# Patient Record
Sex: Female | Born: 1945
Health system: Southern US, Community
[De-identification: ages and names within clinical notes are randomized; demographics above are authoritative.]

## PROBLEM LIST (undated history)

## (undated) DIAGNOSIS — K769 Liver disease, unspecified: Secondary | ICD-10-CM

## (undated) DIAGNOSIS — N289 Disorder of kidney and ureter, unspecified: Secondary | ICD-10-CM

## (undated) DIAGNOSIS — E079 Disorder of thyroid, unspecified: Secondary | ICD-10-CM

## (undated) DIAGNOSIS — D649 Anemia, unspecified: Secondary | ICD-10-CM

## (undated) DIAGNOSIS — I10 Essential (primary) hypertension: Secondary | ICD-10-CM

## (undated) HISTORY — PX: COCHLEAR IMPLANT: SUR684

## (undated) HISTORY — PX: KNEE SURGERY: SHX244

---

## 2000-03-28 ENCOUNTER — Encounter: Payer: Self-pay | Admitting: General Surgery

## 2000-03-28 ENCOUNTER — Ambulatory Visit (HOSPITAL_COMMUNITY): Admission: RE | Admit: 2000-03-28 | Discharge: 2000-03-28 | Payer: Self-pay | Admitting: General Surgery

## 2000-09-18 ENCOUNTER — Ambulatory Visit (HOSPITAL_COMMUNITY): Admission: RE | Admit: 2000-09-18 | Discharge: 2000-09-18 | Payer: Self-pay | Admitting: Gastroenterology

## 2000-09-18 ENCOUNTER — Encounter (INDEPENDENT_AMBULATORY_CARE_PROVIDER_SITE_OTHER): Payer: Self-pay | Admitting: *Deleted

## 2000-09-18 ENCOUNTER — Encounter: Payer: Self-pay | Admitting: Gastroenterology

## 2001-01-20 ENCOUNTER — Other Ambulatory Visit: Admission: RE | Admit: 2001-01-20 | Discharge: 2001-01-20 | Payer: Self-pay | Admitting: Internal Medicine

## 2001-01-27 ENCOUNTER — Ambulatory Visit (HOSPITAL_COMMUNITY): Admission: RE | Admit: 2001-01-27 | Discharge: 2001-01-27 | Payer: Self-pay | Admitting: Internal Medicine

## 2001-01-27 ENCOUNTER — Encounter: Payer: Self-pay | Admitting: Internal Medicine

## 2001-02-17 ENCOUNTER — Observation Stay (HOSPITAL_COMMUNITY): Admission: EM | Admit: 2001-02-17 | Discharge: 2001-02-19 | Payer: Self-pay | Admitting: *Deleted

## 2001-02-17 ENCOUNTER — Encounter: Payer: Self-pay | Admitting: *Deleted

## 2001-02-18 ENCOUNTER — Encounter: Payer: Self-pay | Admitting: *Deleted

## 2001-02-26 ENCOUNTER — Ambulatory Visit (HOSPITAL_COMMUNITY): Admission: RE | Admit: 2001-02-26 | Discharge: 2001-02-26 | Payer: Self-pay | Admitting: *Deleted

## 2001-02-26 ENCOUNTER — Encounter: Payer: Self-pay | Admitting: *Deleted

## 2001-05-27 ENCOUNTER — Ambulatory Visit (HOSPITAL_COMMUNITY): Admission: RE | Admit: 2001-05-27 | Discharge: 2001-05-27 | Payer: Self-pay | Admitting: Gastroenterology

## 2002-01-20 ENCOUNTER — Encounter: Payer: Self-pay | Admitting: Gastroenterology

## 2002-01-20 ENCOUNTER — Ambulatory Visit (HOSPITAL_COMMUNITY): Admission: RE | Admit: 2002-01-20 | Discharge: 2002-01-20 | Payer: Self-pay | Admitting: Gastroenterology

## 2008-05-28 ENCOUNTER — Emergency Department (HOSPITAL_COMMUNITY): Admission: EM | Admit: 2008-05-28 | Discharge: 2008-05-28 | Payer: Self-pay | Admitting: Emergency Medicine

## 2009-01-06 ENCOUNTER — Inpatient Hospital Stay (HOSPITAL_COMMUNITY): Admission: RE | Admit: 2009-01-06 | Discharge: 2009-01-09 | Payer: Self-pay | Admitting: Orthopaedic Surgery

## 2011-04-09 LAB — CBC
HCT: 32.5 % — ABNORMAL LOW (ref 36.0–46.0)
Hemoglobin: 11.5 g/dL — ABNORMAL LOW (ref 12.0–15.0)
Hemoglobin: 13.3 g/dL (ref 12.0–15.0)
Hemoglobin: 10.1 g/dL — ABNORMAL LOW (ref 12.0–15.0)
MCHC: 33 g/dL (ref 30.0–36.0)
MCHC: 33.4 g/dL (ref 30.0–36.0)
MCHC: 33 g/dL (ref 30.0–36.0)
MCHC: 33.5 g/dL (ref 30.0–36.0)
MCV: 94.3 fL (ref 78.0–100.0)
MCV: 95.5 fL (ref 78.0–100.0)
Platelets: 105 10*3/uL — ABNORMAL LOW (ref 150–400)
Platelets: 108 10*3/uL — ABNORMAL LOW (ref 150–400)
RBC: 3.64 MIL/uL — ABNORMAL LOW (ref 3.87–5.11)
RBC: 4.25 MIL/uL (ref 3.87–5.11)
RDW: 13.5 % (ref 11.5–15.5)
RDW: 13.9 % (ref 11.5–15.5)
RDW: 14 % (ref 11.5–15.5)

## 2011-04-09 LAB — BASIC METABOLIC PANEL
BUN: 40 mg/dL — ABNORMAL HIGH (ref 6–23)
BUN: 28 mg/dL — ABNORMAL HIGH (ref 6–23)
BUN: 32 mg/dL — ABNORMAL HIGH (ref 6–23)
CO2: 19 mEq/L (ref 19–32)
CO2: 21 mEq/L (ref 19–32)
CO2: 24 mEq/L (ref 19–32)
Calcium: 10.3 mg/dL (ref 8.4–10.5)
Calcium: 9.3 mg/dL (ref 8.4–10.5)
Chloride: 104 mEq/L (ref 96–112)
Chloride: 109 mEq/L (ref 96–112)
Chloride: 104 mEq/L (ref 96–112)
Creatinine, Ser: 2 mg/dL — ABNORMAL HIGH (ref 0.4–1.2)
Creatinine, Ser: 3.02 mg/dL — ABNORMAL HIGH (ref 0.4–1.2)
Creatinine, Ser: 2.08 mg/dL — ABNORMAL HIGH (ref 0.4–1.2)
Creatinine, Ser: 2.26 mg/dL — ABNORMAL HIGH (ref 0.4–1.2)
GFR calc Af Amer: 19 mL/min — ABNORMAL LOW (ref >60)
GFR calc Af Amer: 30 mL/min — ABNORMAL LOW (ref >60)
GFR calc non Af Amer: 16 mL/min — ABNORMAL LOW (ref >60)
Glucose, Bld: 103 mg/dL — ABNORMAL HIGH (ref 70–99)
Glucose, Bld: 66 mg/dL — ABNORMAL LOW (ref 70–99)
Glucose, Bld: 110 mg/dL — ABNORMAL HIGH (ref 70–99)
Glucose, Bld: 92 mg/dL (ref 70–99)
Potassium: 4.6 mEq/L (ref 3.5–5.1)
Potassium: 4.4 mEq/L (ref 3.5–5.1)
Sodium: 139 mEq/L (ref 135–145)
Sodium: 142 mEq/L (ref 135–145)

## 2011-04-09 LAB — HEPATIC FUNCTION PANEL
AST: 37 U/L (ref 0–37)
Albumin: 3.8 g/dL (ref 3.5–5.2)
Alkaline Phosphatase: 139 U/L — ABNORMAL HIGH (ref 39–117)
Total Protein: 7 g/dL (ref 6.0–8.3)

## 2011-04-09 LAB — PROTIME-INR: INR: 2.9 — ABNORMAL HIGH (ref 0.00–1.49)

## 2011-05-08 NOTE — Discharge Summary (Signed)
[name], [name]                ACCOUNT NO.:  [id number]   MEDICAL RECORD NO.:  [id number]          PATIENT TYPE:  INP   LOCATION:  5039                         FACILITY:  [facility]   PHYSICIAN:  Monico Blitz. Dalldorf, M.D.DATE OF BIRTH:  [birth year]   DATE OF ADMISSION:  01/06/2009  DATE OF DISCHARGE:  01/09/2009                               DISCHARGE SUMMARY   FINAL DIAGNOSIS:  1. End-stage degenerative joint disease, right knee.  2. Postoperative blood loss anemia.  3. Hypertension.  4. Renal insufficiency.  5. Hypothyroidism.  6. History of liver problem.   PROCEDURES:  On January 06, 2009, right total knee arthroplasty.   SURGEON:  Monico Blitz. Rhona Raider, M.D.   HISTORY:  This is a 65 year old African American female followed by Dr.  Rhona Raider for chronic knee pain.  She has subsequently failed  conservative management and was ready for a total knee arthroplasty as  he was scheduled for this.  The patient was admitted to [facility] on January 06, 2009.  At that time she underwent total knee  arthroplasty of the right knee.  The patient tolerated the procedure  well.  No intraoperative complications occurred.  Postoperatively, the  patient did well.  She had postop blood loss anemia, but did not require  any blood products.  She continued to progress satisfactorily and  managed to remain afebrile.  She worked up with physical therapy and  started walking without assistance prior to the day of discharge.   LABORATORY DATA:  Her lab work remains satisfactory.  Her final CBC  showed her WBC was 8.8, hemoglobin 10.1, hematocrit 3.0, and platelets  were 105,000.  Sodium of 139, potassium of 4.1, chloride 107, CO2 24,  BUN 28, creatinine 2.03, glucose 72, and calcium 9.3.   Of note throughout her stay, she has had elevated BUN and creatinine.  This was also noted prior to her admissions.  She did give history of  some type of liver scarring and duct clogging she has had, but  her  indirect and direct bilirubin and liver enzymes remained relatively  normal.  She only had increased creatinine and BUN readings, but she was  doing well and was ready for discharge on the third postoperative day.  This time she preferred discharge.   At the time of discharge her medications were; she was taking Darvocet-N  100 1-2 p.o. q.4-6 p.r.n. pain.  She is taking  triamterene/hydrochlorothiazide 37.5-2.5 once a day.  She has been  taking Vicodin.  She was given medication to go home, which were  Percocet 5/325, 1-2 p.o. q.4-6 h. p.r.n. pain, 50 with no refills.  She  was given Robaxin 750 mg 1 p.o. q.i.d. p.r.n. for muscle spasm and she  was given Coumadin, which she will continue on  pharmacy protocol for Coumadin.  She will continue at home with physical  therapy as well.  No untoward events occurred during the stay.  She was  discharged to home on January 09, 2009, in satisfactory and stable  condition.  She should follow up with Dr. Rhona Raider in 11 days.  Billey Chang, P.A.      Monico Blitz Rhona Raider, M.D.  Electronically Signed    CL/MEDQ  D:  01/09/2009  T:  01/09/2009  Job:  [id number]

## 2011-05-08 NOTE — Op Note (Signed)
[name], [name]                ACCOUNT NO.:  [id number]   MEDICAL RECORD NO.:  [id number]          PATIENT TYPE:  INP   LOCATION:  5039                         FACILITY:  [facility]   PHYSICIAN:  Monico Blitz. Dalldorf, M.D.DATE OF BIRTH:  Nov 07, 1946   DATE OF PROCEDURE:  01/06/2009  DATE OF DISCHARGE:                               OPERATIVE REPORT   PREOPERATIVE DIAGNOSIS:  Right knee degenerative joint disease.   POSTOPERATIVE DIAGNOSIS:  Right knee degenerative joint disease.   PROCEDURE:  Right total knee replacement.   ANESTHESIA:  General and block.   ATTENDING SURGEON:  Monico Blitz. Rhona Raider, MD   ASSISTANT:  Roselee Nova, PA   INDICATIONS FOR PROCEDURE:  The patient is a 65 year old woman with a  long history of a painful right knee.  She has a significant valgus  deformity and has advanced degenerative change by x-ray.  She has failed  oral anti-inflammatories and multiple injections.  She persists with  pain with difficulty walking and pain at rest, and she is offered a knee  replacement.  Informed operative consent was obtained after discussion  of possible complications including reaction to anesthesia, infection,  DVT, PE, and death.   SUMMARY, FINDINGS, AND PROCEDURE:  Under general anesthesia and a block  through a standard longitudinal anterior incision, a right knee  replacement was performed.  She had advanced degenerative change,  especially lateral and had a moderate valgus deformity.  We addressed  her problem with a cemented DePuy LCS system using standard right femur,  2.5 tray, standard 10-mm deep dish insert, and 38-mm all-polyethylene  patella.  We did correct her valgus deformity with some soft tissue  releases and appropriate balancing.  Chauncey Cruel assisted throughout  and was invaluable to the completion of the case in that he helped  position and retract while I performed the procedure.  He also closed  simultaneously to help minimize the OR  time.   DESCRIPTION OF PROCEDURE:  The patient was taken to the operating suite  where general anesthetic was applied without difficulty.  She was also  given a block in the preanesthesia area.  She was positioned supine and  prepped and draped in normal sterile fashion.  After administration of  IV Kefzol, the right leg was elevated, exsanguinated, tourniquet  inflated about the thigh.  A longitudinal anterior incision was made  with dissection down the extensor mechanism.  All appropriate anti-  infected measures were used including closed hooded exhaust systems for  each member of the surgical team, preoperative IV antibiotic, and  Betadine-impregnated drape.  A medial parapatellar incision was made.  The kneecap was flipped and the knee flexed.  Some residual meniscal  tissues were removed along with the ACL and PCL.  Her findings were as  noted above and her bone quality was excellent.  We placed an  extramedullary guide on the tibia to make a cut with a slight posterior  tilt.  I performed a slight soft tissue release off of the distal femur  and then I placed an intramedullary guide to make anterior-posterior  cuts creating  a flexion gap of 10 mm.  A second intramedullary guide was  placed in the femur to make a distal cut creating an equal extension gap  of 10 mm balancing the knee.  This seemed to eliminate her valgus  deformity and appropriate degree.  The femur sized to a standard and the  tibia to 2.5 and appropriate guides were placed and utilized.  We cut  down to take her patella thickness by 8 mm to a 15 and appropriate guide  was placed and utilized for a 38.  A trial reduction was done with all  these components, and she easily came to slight hyperextension and  flexed well.  The patella tracked well and no lateral release was  required.  Trial components were removed followed by pulsatile lavage  irrigation of all 3 cut bony surfaces.  Cement was mixed including   Zinacef antibiotic and was pressurized onto the bones followed by  placement of the aforementioned DePuy LCS components.  Excess cement was  trimmed and pressure was held on the components until the cement had  hardened.  The tourniquet was deflated, and a small amount of bleeding  was easily controlled with Bovie cautery.  The knee was thoroughly  irrigated followed by reapproximation of the extensor mechanism with #1  Vicryl in interrupted fashion.  Subcutaneous tissues were reapproximated  with 0 and 2-0 undyed Vicryl and skin was closed with staples.  Adaptic  was applied followed by dry gauze and loose Ace wrap.  Estimated blood  loss and intraoperative fluids were obtained from the anesthesia records  as can accurate tourniquet time.   DISPOSITION:  The patient was extubated in the operating room and taken  to recovery room in stable addition.  She is to be admitted under  Orthopedic Surgery Service for appropriate postop care to include  perioperative antibiotics and Coumadin plus Lovenox for DVT prophylaxis.      Monico Blitz Rhona Raider, M.D.  Electronically Signed     PGD/MEDQ  D:  01/06/2009  T:  01/07/2009  Job:  [id number]

## 2011-05-11 NOTE — Procedures (Signed)
[facility]  Patient:    [name], [name]                         MRN: [id number] Proc. Date: 02/17/01 Attending:  Jim Desanctis, M.D. CC:         Nelwyn Salisbury, M.D.   Procedure Report  PROCEDURE PERFORMED:  Endoscopic retrograde cholangiopancreatography.  INDICATIONS:  Rule out obstructive liver disease. See previous clinical note and extensive workup by Dr. Nelwyn Salisbury.  ANESTHESIA:  Demerol 100 mg, Versed 11 mg was given intravenously, Glucagon 1 mg and Levsin 0.25 mg was given throughout the procedure.  MEDICATIONS:  Unasyn 3 g preoperatively.  DESCRIPTION OF PROCEDURE:  With the patient mildly sedated in the prone position, room #7 of radiology, the Olympus videoscopic side-viewing duodenoscope was inserted in the mouth and passed into the stomach and second portion of the duodenum where the ampulla of Vater was seen. The os was cannulated subsequently with a guidewire and we followed it with the catheter injecting as we went. The common bile duct appeared normal and there was filling of the gallbladder, but, there appeared to be complete obstruction of the biliary tree in the right and left common hepatic ducts and we were able to get a guidewire through one obstruction on the left and injected contrast material above the obstruction, but it did not delineate this area well and diffused out rather rapidly, indicating there may have been a leak in the system. We pulled the catheter and the guidewire back and terminated the procedure at that time. The patients vital signs and pulse oximeter remained stable. The patient tolerated the procedure well without apparent complications.  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  PLAN:  Will discuss with patient but may need a surgical intervention to bypass this obstruction. DD:   02/17/01 TD:  02/18/01 Job: [id number]

## 2011-05-11 NOTE — Procedures (Signed)
[place]  Patient:    [name], [name]                         MRN: [id number] Proc. Date: 05/27/01 Adm. Date:  [id number] Attending:  Juanita Craver CC:         Royetta Crochet. Karlton Lemon, M.D.   Procedure Report  DATE OF BIRTH:  1946/09/11  REFERRING PHYSICIAN:  Royetta Crochet. Karlton Lemon, M.D.  PROCEDURE PERFORMED:  Colonoscopy.  ENDOSCOPIST:  Nelwyn Salisbury, M.D.  INSTRUMENT USED:  Olympus video colonoscope.  INDICATIONS FOR PROCEDURE:  The patient is a 65 year old African-American female with a history of guaiac positive stools and liver disease, rule out colonic polyps, masses, hemorrhoids, etc.  PREPROCEDURE PREPARATION:  Informed consent was procured from the patient. The patient was fasted for eight hours prior to the procedure and prepped with a bottle of magnesium citrate and a gallon of NuLytely the night prior to the procedure.  PREPROCEDURE PHYSICAL:  The patient had stable vital signs.  Neck supple. Chest clear to auscultation.  S1, S2 regular.  Abdomen soft with normal abdominal bowel sounds.  DESCRIPTION OF PROCEDURE:  The patient was placed in the left lateral decubitus position and sedated with 50 mg of Demerol and 5 mg of Versed intravenously.  Once the patient was adequately sedated and maintained on low-flow oxygen and continuous cardiac monitoring, the Olympus video colonoscope was advanced from the rectum to the cecum.  There was some residual stool in the colon.  Multiple washes were done.  The cecal base was identified.  A very small lesion could have been missed.  The patient tolerated the procedure well without complication.  No masses or polyps were seen.  Small internal hemorrhoids were appreciated on retroflexion in the rectum.  IMPRESSION: 1. Large amount of residual stool in the colon.  A small lesion could have    been missed. 2. No large masses or polyps seen. 3. Small internal hemorrhoids seen on retroflexion  in the rectum.  RECOMMENDATIONS:  Outpatient follow-up is advised for repeat guaiac testing. Further recommendation will be made at that time. DD:  05/27/01 TD:  05/27/01 Job: [id number]

## 2012-07-03 ENCOUNTER — Other Ambulatory Visit: Payer: Self-pay | Admitting: Internal Medicine

## 2012-07-03 ENCOUNTER — Ambulatory Visit
Admission: RE | Admit: 2012-07-03 | Discharge: 2012-07-03 | Disposition: A | Discharge status: Home / Self Care | Payer: Medicare Other | Source: Ambulatory Visit | Attending: Internal Medicine | Admitting: Internal Medicine

## 2012-07-03 DIAGNOSIS — R634 Abnormal weight loss: Secondary | ICD-10-CM

## 2012-07-03 DIAGNOSIS — F17200 Nicotine dependence, unspecified, uncomplicated: Secondary | ICD-10-CM

## 2013-03-30 ENCOUNTER — Encounter (HOSPITAL_COMMUNITY): Payer: Self-pay | Admitting: Emergency Medicine

## 2013-03-30 ENCOUNTER — Emergency Department (HOSPITAL_COMMUNITY): Payer: Medicare Other

## 2013-03-30 ENCOUNTER — Inpatient Hospital Stay (HOSPITAL_COMMUNITY)
Admission: EM | Admit: 2013-03-30 | Discharge: 2013-04-03 | DRG: 193 | Disposition: A | Discharge status: Home / Self Care | Payer: Medicare Other | Attending: Internal Medicine | Admitting: Internal Medicine

## 2013-03-30 ENCOUNTER — Inpatient Hospital Stay (HOSPITAL_COMMUNITY): Payer: Medicare Other

## 2013-03-30 DIAGNOSIS — E878 Other disorders of electrolyte and fluid balance, not elsewhere classified: Secondary | ICD-10-CM

## 2013-03-30 DIAGNOSIS — F17200 Nicotine dependence, unspecified, uncomplicated: Secondary | ICD-10-CM | POA: Diagnosis present

## 2013-03-30 DIAGNOSIS — J181 Lobar pneumonia, unspecified organism: Secondary | ICD-10-CM | POA: Diagnosis present

## 2013-03-30 DIAGNOSIS — R911 Solitary pulmonary nodule: Secondary | ICD-10-CM | POA: Diagnosis present

## 2013-03-30 DIAGNOSIS — D649 Anemia, unspecified: Secondary | ICD-10-CM

## 2013-03-30 DIAGNOSIS — N179 Acute kidney failure, unspecified: Secondary | ICD-10-CM | POA: Diagnosis present

## 2013-03-30 DIAGNOSIS — E871 Hypo-osmolality and hyponatremia: Secondary | ICD-10-CM

## 2013-03-30 DIAGNOSIS — J9601 Acute respiratory failure with hypoxia: Secondary | ICD-10-CM

## 2013-03-30 DIAGNOSIS — E872 Acidosis, unspecified: Secondary | ICD-10-CM | POA: Diagnosis present

## 2013-03-30 DIAGNOSIS — Z79899 Other long term (current) drug therapy: Secondary | ICD-10-CM

## 2013-03-30 DIAGNOSIS — D72829 Elevated white blood cell count, unspecified: Secondary | ICD-10-CM | POA: Diagnosis present

## 2013-03-30 DIAGNOSIS — R630 Anorexia: Secondary | ICD-10-CM | POA: Diagnosis present

## 2013-03-30 DIAGNOSIS — Z720 Tobacco use: Secondary | ICD-10-CM | POA: Diagnosis present

## 2013-03-30 DIAGNOSIS — R1310 Dysphagia, unspecified: Secondary | ICD-10-CM | POA: Diagnosis present

## 2013-03-30 DIAGNOSIS — N2889 Other specified disorders of kidney and ureter: Secondary | ICD-10-CM | POA: Diagnosis present

## 2013-03-30 DIAGNOSIS — J9600 Acute respiratory failure, unspecified whether with hypoxia or hypercapnia: Secondary | ICD-10-CM | POA: Diagnosis not present

## 2013-03-30 DIAGNOSIS — J189 Pneumonia, unspecified organism: Principal | ICD-10-CM | POA: Diagnosis present

## 2013-03-30 DIAGNOSIS — Z713 Dietary counseling and surveillance: Secondary | ICD-10-CM

## 2013-03-30 DIAGNOSIS — I129 Hypertensive chronic kidney disease with stage 1 through stage 4 chronic kidney disease, or unspecified chronic kidney disease: Secondary | ICD-10-CM | POA: Diagnosis present

## 2013-03-30 DIAGNOSIS — E860 Dehydration: Secondary | ICD-10-CM | POA: Diagnosis present

## 2013-03-30 DIAGNOSIS — R042 Hemoptysis: Secondary | ICD-10-CM | POA: Diagnosis present

## 2013-03-30 DIAGNOSIS — J9621 Acute and chronic respiratory failure with hypoxia: Secondary | ICD-10-CM | POA: Diagnosis present

## 2013-03-30 DIAGNOSIS — E039 Hypothyroidism, unspecified: Secondary | ICD-10-CM | POA: Diagnosis present

## 2013-03-30 DIAGNOSIS — K769 Liver disease, unspecified: Secondary | ICD-10-CM | POA: Diagnosis present

## 2013-03-30 DIAGNOSIS — T502X5A Adverse effect of carbonic-anhydrase inhibitors, benzothiadiazides and other diuretics, initial encounter: Secondary | ICD-10-CM

## 2013-03-30 DIAGNOSIS — N184 Chronic kidney disease, stage 4 (severe): Secondary | ICD-10-CM | POA: Diagnosis present

## 2013-03-30 DIAGNOSIS — R634 Abnormal weight loss: Secondary | ICD-10-CM | POA: Diagnosis present

## 2013-03-30 DIAGNOSIS — E079 Disorder of thyroid, unspecified: Secondary | ICD-10-CM

## 2013-03-30 DIAGNOSIS — N289 Disorder of kidney and ureter, unspecified: Secondary | ICD-10-CM | POA: Diagnosis present

## 2013-03-30 HISTORY — DX: Disorder of kidney and ureter, unspecified: N28.9

## 2013-03-30 HISTORY — DX: Liver disease, unspecified: K76.9

## 2013-03-30 HISTORY — DX: Disorder of thyroid, unspecified: E07.9

## 2013-03-30 LAB — LACTIC ACID, PLASMA: Lactic Acid, Venous: 4.1 mmol/L — ABNORMAL HIGH (ref 0.5–2.2)

## 2013-03-30 LAB — TSH: TSH: 0.297 u[IU]/mL — ABNORMAL LOW (ref 0.350–4.500)

## 2013-03-30 LAB — BASIC METABOLIC PANEL
BUN: 48 mg/dL — ABNORMAL HIGH (ref 6–23)
CO2: 16 mEq/L — ABNORMAL LOW (ref 19–32)
Calcium: 9.2 mg/dL (ref 8.4–10.5)
Chloride: 80 mEq/L — ABNORMAL LOW (ref 96–112)
Chloride: 89 mEq/L — ABNORMAL LOW (ref 96–112)
Creatinine, Ser: 1.87 mg/dL — ABNORMAL HIGH (ref 0.50–1.10)
Creatinine, Ser: 1.74 mg/dL — ABNORMAL HIGH (ref 0.50–1.10)
GFR calc Af Amer: 29 mL/min — ABNORMAL LOW (ref >90)
GFR calc Af Amer: 31 mL/min — ABNORMAL LOW (ref >90)
GFR calc non Af Amer: 25 mL/min — ABNORMAL LOW (ref >90)
GFR calc non Af Amer: 26 mL/min — ABNORMAL LOW (ref >90)
Potassium: 4.6 mEq/L (ref 3.5–5.1)
Potassium: 5.2 mEq/L — ABNORMAL HIGH (ref 3.5–5.1)

## 2013-03-30 LAB — URINALYSIS, ROUTINE W REFLEX MICROSCOPIC
Nitrite: NEGATIVE
Specific Gravity, Urine: 1.013 (ref 1.005–1.030)
Urobilinogen, UA: 0.2 mg/dL (ref 0.0–1.0)

## 2013-03-30 LAB — CBC WITH DIFFERENTIAL/PLATELET
Basophils Absolute: 0 10*3/uL (ref 0.0–0.1)
Basophils Absolute: 0 10*3/uL (ref 0.0–0.1)
Basophils Relative: 0 % (ref 0–1)
Eosinophils Absolute: 0 10*3/uL (ref 0.0–0.7)
Eosinophils Absolute: 0 10*3/uL (ref 0.0–0.7)
HCT: 23.9 % — ABNORMAL LOW (ref 36.0–46.0)
HCT: 28 % — ABNORMAL LOW (ref 36.0–46.0)
Hemoglobin: 10.5 g/dL — ABNORMAL LOW (ref 12.0–15.0)
Lymphocytes Relative: 1 % — ABNORMAL LOW (ref 12–46)
Lymphocytes Relative: 1 % — ABNORMAL LOW (ref 12–46)
Lymphs Abs: 0.5 10*3/uL — ABNORMAL LOW (ref 0.7–4.0)
Lymphs Abs: 0.5 10*3/uL — ABNORMAL LOW (ref 0.7–4.0)
Lymphs Abs: 0.6 10*3/uL — ABNORMAL LOW (ref 0.7–4.0)
MCH: 34.5 pg — ABNORMAL HIGH (ref 26.0–34.0)
MCHC: 37.2 g/dL — ABNORMAL HIGH (ref 30.0–36.0)
MCHC: 37.5 g/dL — ABNORMAL HIGH (ref 30.0–36.0)
MCV: 92.1 fL (ref 78.0–100.0)
MCV: 91.2 fL (ref 78.0–100.0)
Monocytes Absolute: 1.9 10*3/uL — ABNORMAL HIGH (ref 0.1–1.0)
Monocytes Relative: 4 % (ref 3–12)
Neutro Abs: 45.3 10*3/uL — ABNORMAL HIGH (ref 1.7–7.7)
Neutro Abs: 46 10*3/uL — ABNORMAL HIGH (ref 1.7–7.7)
Platelets: 171 10*3/uL (ref 150–400)
RDW: 12.6 % (ref 11.5–15.5)
RDW: 12.3 % (ref 11.5–15.5)
WBC: 58.9 10*3/uL (ref 4.0–10.5)

## 2013-03-30 LAB — COMPREHENSIVE METABOLIC PANEL
ALT: 64 U/L — ABNORMAL HIGH (ref 0–35)
AST: 77 U/L — ABNORMAL HIGH (ref 0–37)
BUN: 51 mg/dL — ABNORMAL HIGH (ref 6–23)
CO2: 15 mEq/L — ABNORMAL LOW (ref 19–32)
Calcium: 9.8 mg/dL (ref 8.4–10.5)
Calcium: 9 mg/dL (ref 8.4–10.5)
GFR calc Af Amer: 25 mL/min — ABNORMAL LOW (ref >90)
GFR calc non Af Amer: 23 mL/min — ABNORMAL LOW (ref >90)
Glucose, Bld: 107 mg/dL — ABNORMAL HIGH (ref 70–99)
Sodium: 105 mEq/L — CL (ref 135–145)
Sodium: 106 mEq/L — CL (ref 135–145)
Total Protein: 7.3 g/dL (ref 6.0–8.3)
Total Protein: 6.3 g/dL (ref 6.0–8.3)

## 2013-03-30 LAB — TROPONIN I: Troponin I: <0.3 ng/mL (ref <0.30)

## 2013-03-30 LAB — MRSA PCR SCREENING: MRSA by PCR: NEGATIVE

## 2013-03-30 LAB — CORTISOL: Cortisol, Plasma: 86.8 ug/dL

## 2013-03-30 MED ORDER — INFLUENZA VIRUS VACC SPLIT PF IM SUSP
0.5000 mL | INTRAMUSCULAR | Status: DC
Start: 2013-03-30 — End: 2013-03-30
  Filled 2013-03-30: qty 0.5

## 2013-03-30 MED ORDER — ONDANSETRON HCL 4 MG/2ML IJ SOLN
4.0000 mg | Freq: Once | INTRAMUSCULAR | Status: AC
  Administered 2013-03-30: 4 mg via INTRAVENOUS

## 2013-03-30 MED ORDER — SODIUM CHLORIDE 0.9 % IV BOLUS (SEPSIS)
500.0000 mL | Freq: Once | INTRAVENOUS | Status: AC
  Administered 2013-03-30: 500 mL via INTRAVENOUS

## 2013-03-30 MED ORDER — ALBUTEROL SULFATE HFA 108 (90 BASE) MCG/ACT IN AERS
2.0000 | INHALATION_SPRAY | Freq: Four times a day (QID) | RESPIRATORY_TRACT | Status: DC | PRN
  Filled 2013-03-30: qty 6.7

## 2013-03-30 MED ORDER — ONDANSETRON HCL 4 MG/2ML IJ SOLN
INTRAMUSCULAR | Status: AC
  Administered 2013-03-30: 4 mg via INTRAVENOUS
  Filled 2013-03-30: qty 2

## 2013-03-30 MED ORDER — LEVOFLOXACIN IN D5W 500 MG/100ML IV SOLN
500.0000 mg | Freq: Once | INTRAVENOUS | Status: AC
  Administered 2013-03-30: 500 mg via INTRAVENOUS
  Filled 2013-03-30: qty 100

## 2013-03-30 MED ORDER — HEPARIN SODIUM (PORCINE) 5000 UNIT/ML IJ SOLN
5000.0000 [IU] | Freq: Three times a day (TID) | INTRAMUSCULAR | Status: DC
  Administered 2013-03-30 – 2013-04-03 (×10): 5000 [IU] via SUBCUTANEOUS
  Filled 2013-03-30 (×14): qty 1

## 2013-03-30 MED ORDER — ACETAMINOPHEN 325 MG PO TABS: 650.0000 mg | ORAL_TABLET | Freq: Four times a day (QID) | ORAL | Status: DC | PRN

## 2013-03-30 MED ORDER — OXYCODONE HCL 5 MG PO TABS
5.0000 mg | ORAL_TABLET | ORAL | Status: DC | PRN
  Administered 2013-03-30: 5 mg via ORAL
  Filled 2013-03-30: qty 1

## 2013-03-30 MED ORDER — LEVOFLOXACIN IN D5W 750 MG/150ML IV SOLN
750.0000 mg | INTRAVENOUS | Status: AC
  Administered 2013-04-01 – 2013-04-03 (×2): 750 mg via INTRAVENOUS
  Filled 2013-03-30 (×2): qty 150

## 2013-03-30 MED ORDER — LEVOFLOXACIN IN D5W 750 MG/150ML IV SOLN: 750.0000 mg | INTRAVENOUS | Status: DC

## 2013-03-30 MED ORDER — SODIUM CHLORIDE 0.9 % IV SOLN
INTRAVENOUS | Status: DC
  Administered 2013-03-31: 125 mL/h via INTRAVENOUS
  Administered 2013-04-01: 11:00:00 via INTRAVENOUS
  Administered 2013-04-01: 75 mL/h via INTRAVENOUS
  Administered 2013-04-01 – 2013-04-02 (×3): via INTRAVENOUS

## 2013-03-30 MED ORDER — LEVOTHYROXINE SODIUM 88 MCG PO TABS
88.0000 ug | ORAL_TABLET | Freq: Every day | ORAL | Status: DC
  Administered 2013-03-30 – 2013-04-03 (×5): 88 ug via ORAL
  Filled 2013-03-30 (×6): qty 1

## 2013-03-30 MED ORDER — SODIUM CHLORIDE 0.9 % IV SOLN
INTRAVENOUS | Status: DC
  Administered 2013-03-30: 06:00:00 via INTRAVENOUS

## 2013-03-30 MED ORDER — ALBUTEROL SULFATE (5 MG/ML) 0.5% IN NEBU
5.0000 mg | INHALATION_SOLUTION | Freq: Once | RESPIRATORY_TRACT | Status: AC
  Administered 2013-03-30: 5 mg via RESPIRATORY_TRACT
  Filled 2013-03-30: qty 1

## 2013-03-30 MED ORDER — ALUM & MAG HYDROXIDE-SIMETH 200-200-20 MG/5ML PO SUSP: 30.0000 mL | ORAL | Status: DC | PRN

## 2013-03-30 MED ORDER — ONDANSETRON HCL 4 MG/2ML IJ SOLN: 4.0000 mg | INTRAMUSCULAR | Status: DC | PRN

## 2013-03-30 MED ORDER — PNEUMOCOCCAL VAC POLYVALENT 25 MCG/0.5ML IJ INJ
0.5000 mL | INJECTION | INTRAMUSCULAR | Status: DC
  Filled 2013-03-30: qty 0.5

## 2013-03-30 MED ORDER — METHYLPREDNISOLONE SODIUM SUCC 125 MG IJ SOLR
125.0000 mg | Freq: Once | INTRAMUSCULAR | Status: AC
  Administered 2013-03-30: 125 mg via INTRAVENOUS
  Filled 2013-03-30: qty 2

## 2013-03-30 MED ORDER — ONDANSETRON HCL 4 MG/2ML IJ SOLN
INTRAMUSCULAR | Status: AC
  Administered 2013-03-30: 4 mg
  Filled 2013-03-30: qty 2

## 2013-03-30 MED ORDER — LEVOFLOXACIN IN D5W 250 MG/50ML IV SOLN
250.0000 mg | Freq: Once | INTRAVENOUS | Status: AC
  Administered 2013-03-30: 250 mg via INTRAVENOUS
  Filled 2013-03-30: qty 50

## 2013-03-30 NOTE — ED Notes (Signed)
PT. REPORTS SOB WITH PRODUCTIVE COUGH AND CHEST TIGHTNESS FOR SEVERAL DAYS .

## 2013-03-30 NOTE — Progress Notes (Signed)
Utilization Review Completed Vlasta Baskin J. Konrad Hoak, RN, BSN, NCM [phone]  

## 2013-03-30 NOTE — ED Notes (Signed)
Critical lab results CMET reported face to face with Dr. Lita Mains, [place].

## 2013-03-30 NOTE — Progress Notes (Signed)
CRITICAL VALUE ALERT  Critical value received:  serum ozmo Date of notification: 07apr14  Time of notification: 0630  Critical value read back:yes  Nurse who received alert:  Corie Chiquito  MD notified (1st page):  [place] Time of first page: [phone] MD notified (2nd page):  Time of second page:  Responding MD: Cindee Lame  Time MD responded:  [phone]

## 2013-03-30 NOTE — ED Notes (Signed)
Pt alert, NAD, calm, interactive, resps e/u, LS CTA, appears sob increased wob with exertion in stretcher, NAD while at rest, c/o sob, smoker, recently quit ("cold [place]"), seen recently by PCP, dx'd with bronchitis, on last day of prednisone, also taking pro air inhaler and zithromax. No h/o intubation. Denies fever. C/o productive (brown) cough. Reports chest tightness, denies pain.

## 2013-03-30 NOTE — ED Notes (Signed)
Dr. Lita Mains aware of critical lab results CBC. Orders received and initiated.

## 2013-03-30 NOTE — ED Notes (Signed)
Pt alert, NAD, calm, interactive, skin W&D, resps e/u, speaking in clear complete sentences, no changes, family at [facility].

## 2013-03-30 NOTE — Progress Notes (Signed)
[facility] Progress Note [name] TEAM 1 - Stepdown/ICU TEAM   [name] KDT:[id number] DOB: 26-Aug-1946 DOA: 03/30/2013 PCP: Salena Saner., MD  Brief narrative: 67 year old female patient with a one-week history of cough associated subjective fevers. Diagnosed with bronchitis by her primary care physician one week prior and started on Z-Pak and prednisone. Unfortunately symptoms never improved and she developed anorexia and poor intake as well as hemoptysis. Her fever improved. Apparent 10 pound weight loss over the past week. Apparent recent blood work at her primary care physician and she was informed that her calcium level was high and her renal function was abnormal but was not told that her sodium was low. Outpatient renal ultrasound apparently ordered by primary care physician but result is unavailable in Epic. At presentation to the ER she was found to have profound hyponatremia with a sodium of 105. Of note patient is also on Maxzide at home. Her BUN is elevated but in review of previous record from 2010 her creatinine appears to be at baseline although we do not have a current creatinine for comparison. In addition to the above findings patient had a marked leukocytosis with a white count of 49,300 with a left shift, neutrophils 92%. This is in the absence of fevers at presentation. Her serum CO2 was low at 17 and subsequent lactic acid post admission was 4.1. She was given normal saline IV fluids in the emergency department prior to obtaining SIADH labs.  Assessment/Plan:  Hyponatremia-severe -etiology seems primarily 2/2 profound dehydration as Na+ continues to rise after hydration; SIADH labs not helpful given pre admit use of thiazide diuretics and receipt of NS before labs obtained -given her smoking history and mass-like opacity in RUL consider bronchogenic CA as etiology  -will follow Na+ q 8 hrs  Acute respiratory failure with hypoxia/Right upper lobe  consolidation/Cough with hemoptysis -concerning for possible malignancy vs focused area of infection -will obtain CT Chest (no contrast 2/2 renal function) to aid clarification  Weight loss (Acute) -suspect due to [facility] and anorexia from HypoNa+  Thiazide diuretics causing adverse effect in therapeutic use -see above re: hypoNa+  Acute renal failure on CKD stage 4, GFR 15-29 ml/min -BUN trending down with hydration - Scr stable and at baseline for 2010  Leukocytosis - marked -suspect due to [facility], acute infection and recent steroids - follow trends - if persists may need heme eval  Liver disease/? Cirrhosis -had remote liver biopsy ?cirrhosis -LFT's non obstructing pattern  Hypothyroidism -On Synthroid at home -TSH low and indicative of sick euthyroid syndrome -consider check free T4 and T3 if develops tachycardia  Tobacco abuse -quit < 1 month ago  DVT prophylaxis: SCDs Code Status: Full Family Communication: Patient and her family at bedside Disposition Plan: Stepdown Isolation: None  Consultants: None  Procedures: None  Antibiotics: Levaquin 4/7 >>>  HPI/Subjective: Patient alert and endorses continued anorexia. No further tremulous activity.   Objective: Blood pressure 124/66, pulse 72, temperature 98.5 F (36.9 C), temperature source Oral, resp. rate 22, height $RemoveBe'5\' 4"'SvHSKTHTN$  (1.626 m), weight 52.1 kg (114 lb 13.8 oz), SpO2 99.00%.  Intake/Output Summary (Last 24 hours) at 03/30/13 1605 Last data filed at 03/30/13 1500  Gross per 24 hour  Intake   1780 ml  Output   1425 ml  Net    355 ml   Exam: Followup exam completed  Data Reviewed: Basic Metabolic Panel:  Recent Labs Lab 03/30/13 0146 03/30/13 0319 03/30/13 0732 03/30/13 0913  NA 105* 106* 110* 109*  K 5.5* 4.9 4.6 4.9  CL 75* 78* 81* 80*  CO2 17* 15* 15* 13*  GLUCOSE 107* 119* 114* 122*  BUN 51* 51* 48* 47*  CREATININE 2.20* 2.11* 1.96* 1.87*  CALCIUM 9.8 9.0 9.1 9.2   Liver Function  Tests:  Recent Labs Lab 03/30/13 0146 03/30/13 0319  AST 94* 77*  ALT 75* 64*  ALKPHOS 263* 218*  BILITOT 0.8 0.8  PROT 7.3 6.3  ALBUMIN 3.0* 2.5*   CBC:  Recent Labs Lab 03/30/13 0146 03/30/13 0319 03/30/13 0732  WBC 49.3* 48.4* 58.9*  NEUTROABS 45.3* 46.0* 55.9*  HGB 8.9* 10.5* 11.0*  HCT 23.9* 28.0* 31.7*  MCV 92.3 92.1 91.2  PLT 150 139* 171   Cardiac Enzymes:  Recent Labs Lab 03/30/13 0146  TROPONINI <0.30     Recent Results (from the past 240 hour(s))  MRSA PCR SCREENING     Status: None   Collection Time    03/30/13  6:38 AM      Result Value Range Status   MRSA by PCR NEGATIVE  NEGATIVE Final   Comment:            The GeneXpert MRSA Assay (FDA     approved for NASAL specimens     only), is one component of a     comprehensive MRSA colonization     surveillance program. It is not     intended to diagnose MRSA     infection nor to guide or     monitor treatment for     MRSA infections.     Studies:  Recent x-ray studies have been reviewed in detail by the Attending Physician  Scheduled Meds:  Reviewed in detail by the Attending Physician   Erin Hearing, [place] [facility] Office  [phone] Pager [phone]  On-Call/Text Page:      [email or web address]      password [email or web address]  If 7PM-7AM, please contact night-coverage [email or web address] Password [email or web address] 03/30/2013, 4:05 PM   LOS: 0 days   I have personally examined this patient and reviewed the entire database. I have reviewed the above note, made any necessary editorial changes, and agree with its content.  Cherene Altes, MD [facility]

## 2013-03-30 NOTE — ED Notes (Signed)
Pt to xray via stretcher, family x2 remains in room, pharm med tech at [facility] with meds.

## 2013-03-30 NOTE — ED Provider Notes (Signed)
History     CSN: [id number]  Arrival date & time 03/30/13  0116   First MD Initiated Contact with Patient 03/30/13 0126      Chief Complaint  Patient presents with  . Shortness of Breath  . Cough    (Consider location/radiation/quality/duration/timing/severity/associated sxs/prior treatment) HPI Pt seen at her PMD on Tuesday of this week for cough and SOB. Diagnosed with bronchitis. Started on z-pak, steroids and abuterol. Pt has been complaint with meds. Improved SOB and wheezing though productive cough of brown sputum, chills, R chest pain with coughing and fatigue. No lower ext swelling.   Past Medical History  Diagnosis Date  . Renal disorder   . Thyroid disease   . Liver disease     History reviewed. No pertinent past surgical history.  No family history on file.  History  Substance Use Topics  . Smoking status: Current Every Day Smoker  . Smokeless tobacco: Not on file  . Alcohol Use: No    OB History   Grav Para Term Preterm Abortions TAB SAB Ect Mult Living                  Review of Systems  Constitutional: Positive for chills and fatigue. Negative for fever.  HENT: Negative for congestion and sore throat.   Respiratory: Positive for shortness of breath and wheezing.   Cardiovascular: Positive for chest pain. Negative for palpitations and leg swelling.  Gastrointestinal: Negative for nausea, vomiting and abdominal pain.  Genitourinary: Negative for dysuria.  Musculoskeletal: Negative for back pain.  Skin: Negative for rash and wound.  Neurological: Positive for weakness. Negative for dizziness, numbness and headaches.  All other systems reviewed and are negative.    Allergies  Review of patient's allergies indicates no known allergies.  Home Medications   Current Outpatient Rx  Name  Route  Sig  Dispense  Refill  . albuterol (PROVENTIL HFA;VENTOLIN HFA) 108 (90 BASE) MCG/ACT inhaler   Inhalation   Inhale 2 puffs into the lungs every 6 (six)  hours as needed for wheezing.         Marland Kitchen azithromycin (ZITHROMAX) 250 MG tablet   Oral   Take 250 mg by mouth daily. It appears that patient finished treatment yesterday.         . levothyroxine (SYNTHROID, LEVOTHROID) 88 MCG tablet   Oral   Take 88 mcg by mouth daily before breakfast.         . metoprolol tartrate (LOPRESSOR) 25 MG tablet   Oral   Take 25 mg by mouth 2 (two) times daily.         . predniSONE (STERAPRED UNI-PAK) 10 MG tablet   Oral   Take 10-60 mg by mouth daily. Taper dose pack- 60 mg on first day and decrease by 10 mg daily for 6 days. Patient has one 10 mg tablet left for treatment.         . triamterene-hydrochlorothiazide (MAXZIDE-25) 37.5-25 MG per tablet   Oral   Take 1 tablet by mouth daily.           BP 125/64  Pulse 74  Temp(Src) 98.4 F (36.9 C) (Oral)  Resp 23  SpO2 100%  Physical Exam  Nursing note and vitals reviewed. Constitutional: She is oriented to person, place, and time. She appears well-developed and well-nourished. No distress.  HENT:  Head: Normocephalic and atraumatic.  Mouth/Throat: Oropharynx is clear and moist.  Eyes: EOM are normal. Pupils are equal, round, and reactive to  light.  Neck: Normal range of motion. Neck supple.  Cardiovascular: Normal rate and regular rhythm.   Pulmonary/Chest: Effort normal. No respiratory distress. She has wheezes (scattered wheezes). She has rales (Rhonchi R lung field).  Abdominal: Soft. Bowel sounds are normal. She exhibits no distension and no mass. There is no tenderness. There is no rebound and no guarding.  Musculoskeletal: Normal range of motion. She exhibits no edema and no tenderness.  No calf swelling or tenderness  Neurological: She is alert and oriented to person, place, and time.  Skin: Skin is warm and dry. No rash noted. No erythema.  Psychiatric: She has a normal mood and affect. Her behavior is normal.    ED Course  Procedures (including critical care  time)  Labs Reviewed  CBC WITH DIFFERENTIAL - Abnormal; Notable for the following:    WBC 49.3 (*)    RBC 2.59 (*)    Hemoglobin 8.9 (*)    HCT 23.9 (*)    MCH 34.4 (*)    MCHC 37.2 (*)    Neutrophils Relative 92 (*)    Lymphocytes Relative 1 (*)    Neutro Abs 45.3 (*)    Lymphs Abs 0.5 (*)    Monocytes Absolute 3.5 (*)    All other components within normal limits  COMPREHENSIVE METABOLIC PANEL - Abnormal; Notable for the following:    Sodium 105 (*)    Potassium 5.5 (*)    Chloride 75 (*)    CO2 17 (*)    Glucose, Bld 107 (*)    BUN 51 (*)    Creatinine, Ser 2.20 (*)    Albumin 3.0 (*)    AST 94 (*)    ALT 75 (*)    Alkaline Phosphatase 263 (*)    GFR calc non Af Amer 22 (*)    GFR calc Af Amer 25 (*)    All other components within normal limits  COMPREHENSIVE METABOLIC PANEL - Abnormal; Notable for the following:    Sodium 106 (*)    Chloride 78 (*)    CO2 15 (*)    Glucose, Bld 119 (*)    BUN 51 (*)    Creatinine, Ser 2.11 (*)    Albumin 2.5 (*)    AST 77 (*)    ALT 64 (*)    Alkaline Phosphatase 218 (*)    GFR calc non Af Amer 23 (*)    GFR calc Af Amer 26 (*)    All other components within normal limits  CBC WITH DIFFERENTIAL - Abnormal; Notable for the following:    WBC 48.4 (*)    RBC 3.04 (*)    Hemoglobin 10.5 (*)    HCT 28.0 (*)    MCH 34.5 (*)    MCHC 37.5 (*)    Platelets 139 (*)    Neutrophils Relative 95 (*)    Lymphocytes Relative 1 (*)    Neutro Abs 46.0 (*)    Lymphs Abs 0.5 (*)    Monocytes Absolute 1.9 (*)    All other components within normal limits  TROPONIN I  URINALYSIS, ROUTINE W REFLEX MICROSCOPIC  OSMOLALITY, URINE  OSMOLALITY  SODIUM, URINE, RANDOM  TSH  LACTIC ACID, PLASMA   Dg Chest 2 View  03/30/2013  *RADIOLOGY REPORT*  Clinical Data: Productive cough and shortness of breath; history of smoking.  CHEST - 2 VIEW  Comparison: None.  Findings: Right upper lobe and more mild left upper lobe airspace opacities are concerning  for multifocal pneumonia.  No definite pleural effusion or pneumothorax is seen.  The appearance is atypical for mass.  The heart is normal in size.  No acute osseous abnormalities are seen.  There is interposition of the hepatic flexure of the colon superior to the liver.  IMPRESSION: Right upper lobe and more mild left upper lobe airspace opacities, concerning for multifocal pneumonia.   Original Report Authenticated By: Santa Lighter, M.D.      1. Community acquired pneumonia   2. Hyponatremia   3. Hypochloremia   4. Renal insufficiency   5. Leukocytosis   6. Anemia   7. Renal disorder   8. Liver disease   9. Thyroid disease   10. Acute renal failure   11. PNA (pneumonia)      Date: 03/30/2013  Rate: 70  Rhythm: normal sinus rhythm  QRS Axis: normal  Intervals: normal  ST/T Wave abnormalities: ST depressions inferiorly and ST depressions laterally  Conduction Disutrbances:none  Narrative Interpretation:   Old EKG Reviewed: none available    MDM   Discussed with [facility] who will admit.        Julianne Rice, MD 03/30/13 [phone]

## 2013-03-30 NOTE — Consult Note (Addendum)
Requesting Physician:  [facility]  Reason for Consult:  Hyponatremia  and renal insufficiency HPI: The patient is a 67 y.o. year-old who was admitted to [facility] on 4/7 with a presumed bronchitis that did not respond to outpatient therapy, as well as new onset hemoptysis, 10 lb weight loss, fevers, poor po intake.  Her labs were pertinent for a creatinine of 2.2 and a sodium of 105, CO2 of 17 - down to 13, and WBC of 58,900!Marland Kitchen Xrays were compatible with bilateral multifocal pneumonia  She has had known CKD3 in the past and was evaluated in 2010 by Dr.Webb for a creatinine of 2.71 with neg SPEP/UPEP, US showing 9.4 right and 7.6 cm left, normal PTH, no proteinuria.  Never came back for followup but was though on the front end to have possible nephrosclerosis.  Sodium at that time was normal at 139, calcium a little up at 10.4. (all in 2010)  With regard to her hyponatremia, has been on maxzide for years, now discontinued. Has been getting NS at 125/hour since admission and last 2 sodiums were 110, 109. Urine output is about 1350 since admission.  CO2 has dropped to 13 with gap of 16, lactic acid of 4.  UOsm was 281 with posm of 245  Sodium  Date/Time Value Range Status  03/30/2013  9:13 AM 109* 135 - 145 mEq/L Final  03/30/2013  7:32 AM 110* 135 - 145 mEq/L Final  03/30/2013  3:19 AM 106* 135 - 145 mEq/L Final  03/30/2013  1:46 AM 105* 135 - 145 mEq/L Final  01/09/2009  3:45 AM 139  135 - 145 mEq/L Final  01/08/2009  3:35 AM 136  135 - 145 mEq/L Final  01/07/2009  6:30 AM 139  135 - 145 mEq/L Final  12/31/2008  2:16 PM 142  135 - 145 mEq/L Final    Past Medical History:  Past Medical History  Diagnosis Date  . Renal disorder   . Thyroid disease   . Liver disease     Past Surgical History: History reviewed. No pertinent past surgical history.  Family History: History reviewed. No pertinent family history. Social History:  reports that she has been smoking.  She does not have any smokeless  tobacco history on file. She reports that she does not drink alcohol or use illicit drugs. 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 Authorizing Provider  albuterol (PROVENTIL HFA;VENTOLIN HFA) 108 (90 BASE) MCG/ACT inhaler Inhale 2 puffs into the lungs every 6 (six) hours as needed for wheezing.   Yes Historical Provider, MD  azithromycin (ZITHROMAX) 250 MG tablet Take 250 mg by mouth daily. It appears that patient finished treatment yesterday.   Yes Historical Provider, MD  levothyroxine (SYNTHROID, LEVOTHROID) 88 MCG tablet Take 88 mcg by mouth daily before breakfast.   Yes Historical Provider, MD  metoprolol tartrate (LOPRESSOR) 25 MG tablet Take 25 mg by mouth 2 (two) times daily.   Yes Historical Provider, MD  predniSONE (STERAPRED UNI-PAK) 10 MG tablet Take 10-60 mg by mouth daily. Taper dose pack- 60 mg on first day and decrease by 10 mg daily for 6 days. Patient has one 10 mg tablet left for treatment.   Yes Historical Provider, MD  triamterene-hydrochlorothiazide (MAXZIDE-25) 37.5-25 MG per tablet Take 1 tablet by mouth daily.   Yes Historical Provider, MD  Inpatient medications: . influenza  inactive virus vaccine  0.5 mL Intramuscular Tomorrow-1000  . [START ON 04/01/2013] levofloxacin (LEVAQUIN) IV  750 mg Intravenous Q48H  . levothyroxine  88 mcg Oral QAC breakfast  . pneumococcal 23 valent vaccine  0.5 mL Intramuscular Tomorrow-1000    Review of Systems Positive for cough, brown and some bloody sputum production, shortness of breath (improved), single episode of confusion last Monday, No nausea or vomiting but anorexia No falls or gait disturbance No swelling Dry mouth (for which she drinks water about 4 20 oz bottles/day) UOP has increased since admission and IVF No other bleeding x for hemoptysis   Physical  Exam:  Blood pressure 124/66, pulse 72, temperature 98.4 F (36.9 C), temperature source Oral, resp. rate 22, height $RemoveBe'5\' 4"'DWtIzjlAZ$  (1.626 m), weight 52.1 kg (114 lb 13.8 oz), SpO2 99.00%.  Gen: Perky appearing BF  Wearing O2  NAD Skin: no rash, cyanosis Neck: no JVD, no bruits or LAN Chest: Rhonchous breath sounds bilaterally Heart: regular, no rub or gallop Abdomen: soft, no focal tenderness Ext: no edema of lower exts which are cool Neuro: alert, Ox3, no focal deficit Heme/Lymph: no bruising or LAN  Labs: Basic Metabolic Panel:  Recent Labs Lab 03/30/13 0146 03/30/13 0319 03/30/13 0732 03/30/13 0913  NA 105* 106* 110* 109*  K 5.5* 4.9 4.6 4.9  CL 75* 78* 81* 80*  CO2 17* 15* 15* 13*  GLUCOSE 107* 119* 114* 122*  BUN 51* 51* 48* 47*  CREATININE 2.20* 2.11* 1.96* 1.87*  CALCIUM 9.8 9.0 9.1 9.2   Liver Function Tests:  Recent Labs Lab 03/30/13 0146 03/30/13 0319  AST 94* 77*  ALT 75* 64*  ALKPHOS 263* 218*  BILITOT 0.8 0.8  PROT 7.3 6.3  ALBUMIN 3.0* 2.5*  CBC:  Recent Labs Lab 03/30/13 0146 03/30/13 0319 03/30/13 0732  WBC 49.3* 48.4* 58.9*  NEUTROABS 45.3* 46.0* 55.9*  HGB 8.9* 10.5* 11.0*  HCT 23.9* 28.0* 31.7*  MCV 92.3 92.1 91.2  PLT 150 139* 171   Recent Labs Lab 03/30/13 0146  TROPONINI <0.30  Results for FRANSHESCA, CHIPMAN (MRN [id number]) as of 03/30/2013 15:41  Ref. Range 03/30/2013 04:22  Osmolality Latest Range: 275-300 mOsm/kg 245 (L)  Results for CHIDERA, DEARCOS (MRN [id number]) as of 03/30/2013 15:41  Ref. Range 03/30/2013 05:19 03/30/2013 05:20  Osmolality, Ur Latest Range: [phone] mOsm/kg  281 (L)  Sodium, Ur No range found 34   No urine creatinine done  Xrays/Other Studies: Dg Chest 2 View  03/30/2013  *RADIOLOGY REPORT*  Clinical Data: Productive cough and shortness of breath; history of smoking.  CHEST - 2 VIEW  Comparison: None.  Findings: Right upper lobe and more mild left upper lobe airspace opacities are concerning for multifocal pneumonia.  No  definite pleural effusion or pneumothorax is seen.  The appearance is atypical for mass.  The heart is normal in size.  No acute osseous abnormalities are seen.  There is interposition of the hepatic flexure of the colon superior to the liver.  IMPRESSION: Right upper lobe and more mild left upper lobe airspace opacities, concerning for multifocal pneumonia.   Original Report Authenticated By: Santa Lighter, M.D.    Ct Chest Wo Contrast  03/30/2013  *RADIOLOGY REPORT*  Clinical Data: Evaluate right upper lobe opacity seen on chest radiograph.  CT CHEST WITHOUT CONTRAST  Technique:  Multidetector CT imaging of the chest was performed following the standard protocol without IV contrast.  Comparison: Chest radiograph 03/30/2013 and 07/03/2012.  Findings: No  pathologically enlarged mediastinal or axillary lymph nodes.  Hilar regions are difficult to definitively evaluate without IV contrast.  Coronary artery calcification.  Heart size normal.  No pericardial effusion.  There is thickening of the distal esophageal wall, which can be seen with gastroesophageal reflux disease.  Esophagus is mildly dilated and air filled through at its course, indicative of dysmotility.  Tiny bilateral pleural effusions.  Emphysema.  Airspace consolidation is worst in the right upper lobe, with some involvement of the left upper lobe as well.  Nodular density in the medial left lower lobe measures 6 mm (image 26).  Adherent debris is seen in the trachea which is otherwise unremarkable.  Incidental imaging of the upper abdomen shows an extremely irregular liver margin.  No worrisome lytic or sclerotic lesions.  IMPRESSION:  1.  Patchy airspace consolidation in the upper lobes, right greater than left, most consistent with pneumonia. Tiny bilateral pleural effusions.  Follow-up to clearing could be performed, as clinically indicated. 2.  6 mm nodular density in the medial left lower lobe. Given risk factors for bronchogenic carcinoma,  follow-up chest CT at 6 - 12 months is recommended.  This recommendation follows the consensus statement: Guidelines for Management of Small Pulmonary Nodules Detected on CT Scans: A Statement from the [employer] as published in Radiology 2005; 237:395-400. 3.  Coronary artery calcification. 4.  Cirrhosis.   Original Report Authenticated By: Lorin Picket, M.D.     Impression/Plan Hyponatremia Profound but amazingly asymptomatic On thiazide at home and endorses drinking 4X 20 oz bottles of water/day Not present in 2010 Uosm >posm but was on thiazide so cannot say SIADH at this point (although has reasons for ADH effect including volume depletion, pneumonia (and cannot r/t pulm malig exp with long tobacco hx) TSH not elevated Cortisol elevated (on steroids PTA) Now getting IVF and Na appears to be rising UOP has increased MAY be dealing with large component of volume depletion but if sodium "stalls" can repeat studies Cont to check T1L  Metabolic acidosis with elevated lactate, mildly increased gap  CKD 3/4 Little change in renal function since evaluation in 2010 Had h/o NSAIDS at that time, some renal asymmetry and long hx HTN Was attributed at that time to nephrosclerosis Would like to repeat an [place] to re-look at kidney sizes UA is bland (no protein, no cells)  Bilateral pneumonia  Antibiotics  Profound leukocytosis Cultures pending  Thanks for consult.  Will folllow with you  03/30/2013, 3:43 PM

## 2013-03-30 NOTE — Care Management Note (Unsigned)
    Page 1 of 1   03/30/2013     3:51:55 PM   CARE MANAGEMENT NOTE 03/30/2013  Patient:  [name], [name]   Account Number:  [id number]  Date Initiated:  03/30/2013  Documentation initiated by:  Fuller Mandril  Subjective/Objective Assessment:   67 yo female with one week of cough, subjective fevers, seen by her pcp in the last week dx with bronchitis.  She continued to feel poorly with poor po intake/ Chief Complaint:  Cough, not feeling well, not eating well     Action/Plan:   Home with spouse/ return hm with spouse   Anticipated DC Date:  04/02/2013   Anticipated DC Plan:  HOME/SELF CARE         Choice offered to / List presented to:             Status of service:   Medicare Important Message given?   (If response is "NO", the following Medicare IM given date fields will be blank) Date Medicare IM given:   Date Additional Medicare IM given:    Discharge Disposition:    Per UR Regulation:    If discussed at Long Length of Stay Meetings, dates discussed:    Comments:  03/30/13.Marland KitchenMarland KitchenFuller Mandril, RN, BSN, [place] [phone] Spoke with pt regarding discharge planning.  Pt denies having needs at this time.  CM to follow up prior to D/C home.

## 2013-03-30 NOTE — H&P (Signed)
PCP:   Salena Saner., MD   Chief Complaint:  Cough, not feeling well, not eating well  HPI: 67 yo female with one week of cough, subjective fevers, seen by her pcp in the last week dx with bronchitis and placed on zpack and prednisone.  She has continued to feel poorly with poor po intake.  She has had several days of hemoptysis.  The fever has gotten better.  She has had 10 lb wt loss over last several week per family, dtr is with her.  Pt denies any pain, c/o overall weakness.  No le edema or swelling. She has thyroid problems.  Also on hctz/triam for a long time.  She does get blood work done regularly with her pcp and was told recently that her calcium levels were high and her renal function was abnormal, nothing was mentioned about her sodium level.  She had a u/s of her kidneys recently by her pcp and do not know the results.  She has not been on abx other than the recent zpack.  She has had no n/v/d.  No abd pain, no cp no sob.  She stopped smoking last week.  Her mental status is clear right now and can answer questions appropriately.  She has received 500cc ns ivf in ED along with levaquin.  Review of Systems:  Positive and negative as per HPI otherwise all other systems are negative  Past Medical History: Past Medical History  Diagnosis Date  . Renal disorder   . Thyroid disease   . Liver disease    History reviewed. No pertinent past surgical history.  Medications: Prior to Admission medications   Medication Sig Start Date End Date Taking? Authorizing Provider  albuterol (PROVENTIL HFA;VENTOLIN HFA) 108 (90 BASE) MCG/ACT inhaler Inhale 2 puffs into the lungs every 6 (six) hours as needed for wheezing.   Yes Historical Provider, MD  azithromycin (ZITHROMAX) 250 MG tablet Take 250 mg by mouth daily. It appears that patient finished treatment yesterday.   Yes Historical Provider, MD  levothyroxine (SYNTHROID, LEVOTHROID) 88 MCG tablet Take 88 mcg by mouth daily before  breakfast.   Yes Historical Provider, MD  metoprolol tartrate (LOPRESSOR) 25 MG tablet Take 25 mg by mouth 2 (two) times daily.   Yes Historical Provider, MD  predniSONE (STERAPRED UNI-PAK) 10 MG tablet Take 10-60 mg by mouth daily. Taper dose pack- 60 mg on first day and decrease by 10 mg daily for 6 days. Patient has one 10 mg tablet left for treatment.   Yes Historical Provider, MD  triamterene-hydrochlorothiazide (MAXZIDE-25) 37.5-25 MG per tablet Take 1 tablet by mouth daily.   Yes Historical Provider, MD    Allergies:  No Known Allergies  Social History:  reports that she has been smoking.  She does not have any smokeless tobacco history on file. She reports that she does not drink alcohol or use illicit drugs.  Family History: negative  Physical Exam: Filed Vitals:   03/30/13 0123 03/30/13 0128 03/30/13 0132 03/30/13 0340  BP: 145/66   131/68  Pulse: 66 31 64 76  Temp: 98.2 F (36.8 C)     TempSrc: Oral     Resp: 22   21  SpO2: 99% 94% 97% 100%   General appearance: alert, cooperative and no distress Head: Normocephalic, without obvious abnormality, atraumatic Eyes: negative Nose: Nares normal. Septum midline. Mucosa normal. No drainage or sinus tenderness. Neck: no JVD and supple, symmetrical, trachea midline Lungs: clear to auscultation bilaterally Heart: regular rate  and rhythm, S1, S2 normal, no murmur, click, rub or gallop Abdomen: soft, non-tender; bowel sounds normal; no masses,  no organomegaly Extremities: extremities normal, atraumatic, no cyanosis or edema Pulses: 2+ and symmetric Skin: Skin color, texture, turgor normal. No rashes or lesions Neurologic: Grossly normal    Labs on Admission:   Recent Labs  03/30/13 0146 03/30/13 0319  NA 105* 106*  K 5.5* 4.9  CL 75* 78*  CO2 17* 15*  GLUCOSE 107* 119*  BUN 51* 51*  CREATININE 2.20* 2.11*  CALCIUM 9.8 9.0    Recent Labs  03/30/13 0146 03/30/13 0319  AST 94* 77*  ALT 75* 64*  ALKPHOS 263*  218*  BILITOT 0.8 0.8  PROT 7.3 6.3  ALBUMIN 3.0* 2.5*    Recent Labs  03/30/13 0146 03/30/13 0319  WBC 49.3* 48.4*  NEUTROABS 45.3* 46.0*  HGB 8.9* 10.5*  HCT 23.9* 28.0*  MCV 92.3 92.1  PLT 150 139*    Recent Labs  03/30/13 0146  TROPONINI <0.30   Radiological Exams on Admission: Dg Chest 2 View  03/30/2013  *RADIOLOGY REPORT*  Clinical Data: Productive cough and shortness of breath; history of smoking.  CHEST - 2 VIEW  Comparison: None.  Findings: Right upper lobe and more mild left upper lobe airspace opacities are concerning for multifocal pneumonia.  No definite pleural effusion or pneumothorax is seen.  The appearance is atypical for mass.  The heart is normal in size.  No acute osseous abnormalities are seen.  There is interposition of the hepatic flexure of the colon superior to the liver.  IMPRESSION: Right upper lobe and more mild left upper lobe airspace opacities, concerning for multifocal pneumonia.   Original Report Authenticated By: Santa Lighter, M.D.     Assessment/Plan  67 yo female with bilateral pna, significant hyponatremia, renal failure,  Principal Problem:   Hyponatremia Active Problems:   Renal disorder   Liver disease   Thyroid disease   Acute renal failure   PNA (pneumonia)   Cough with hemoptysis   Tobacco abuse   Weight loss   Thiazide diuretics causing adverse effect in therapeutic use  Will hold diuretics.  Appears mildly hypovolemic.  ua and urine studies pending.  Will give 500cc ns and then 100cc/hr ns.  Repeat bmp in 3 hours.  Consider 3% nacl if does not improve.  Cover with levaquin and will need ct chest to r/o underlying malignancy at some point.  Place in stepdown overnight.  Hold off on further steroids at this point.  Obtain records from pcp.  nephro also called for consult asked to add on cortisol level.  Agreed with above.    Cledis Sohn A 03/30/2013, 4:37 AM

## 2013-03-30 NOTE — ED Notes (Signed)
Admitting MD at [facility].  

## 2013-03-30 NOTE — ED Notes (Signed)
Dr. Lita Mains at [facility], lab at [facility], family at [facility].

## 2013-03-30 NOTE — ED Notes (Signed)
Lab finished at [facility] (CBC re-drawn)

## 2013-03-31 ENCOUNTER — Inpatient Hospital Stay (HOSPITAL_COMMUNITY): Payer: Medicare Other

## 2013-03-31 DIAGNOSIS — J9600 Acute respiratory failure, unspecified whether with hypoxia or hypercapnia: Secondary | ICD-10-CM

## 2013-03-31 LAB — CBC
HCT: 30.3 % — ABNORMAL LOW (ref 36.0–46.0)
Hemoglobin: 11.4 g/dL — ABNORMAL LOW (ref 12.0–15.0)
MCH: 33.8 pg (ref 26.0–34.0)
MCV: 89.9 fL (ref 78.0–100.0)
Platelets: 194 10*3/uL (ref 150–400)
RBC: 3.37 MIL/uL — ABNORMAL LOW (ref 3.87–5.11)

## 2013-03-31 LAB — BASIC METABOLIC PANEL
BUN: 40 mg/dL — ABNORMAL HIGH (ref 6–23)
BUN: 38 mg/dL — ABNORMAL HIGH (ref 6–23)
CO2: 16 mEq/L — ABNORMAL LOW (ref 19–32)
CO2: 16 mEq/L — ABNORMAL LOW (ref 19–32)
Calcium: 9.2 mg/dL (ref 8.4–10.5)
Calcium: 9.4 mg/dL (ref 8.4–10.5)
Chloride: 96 mEq/L (ref 96–112)
Chloride: 98 mEq/L (ref 96–112)
Chloride: 96 mEq/L (ref 96–112)
Chloride: 96 mEq/L (ref 96–112)
Creatinine, Ser: 1.58 mg/dL — ABNORMAL HIGH (ref 0.50–1.10)
GFR calc Af Amer: 37 mL/min — ABNORMAL LOW (ref >90)
GFR calc Af Amer: 37 mL/min — ABNORMAL LOW (ref >90)
GFR calc Af Amer: 39 mL/min — ABNORMAL LOW (ref >90)
Glucose, Bld: 114 mg/dL — ABNORMAL HIGH (ref 70–99)
Glucose, Bld: 111 mg/dL — ABNORMAL HIGH (ref 70–99)
Potassium: 4.9 mEq/L (ref 3.5–5.1)
Potassium: 4.8 mEq/L (ref 3.5–5.1)
Sodium: 123 mEq/L — ABNORMAL LOW (ref 135–145)
Sodium: 124 mEq/L — ABNORMAL LOW (ref 135–145)

## 2013-03-31 LAB — MAGNESIUM: Magnesium: 2.5 mg/dL (ref 1.5–2.5)

## 2013-03-31 MED ORDER — SODIUM BICARBONATE 650 MG PO TABS
650.0000 mg | ORAL_TABLET | Freq: Three times a day (TID) | ORAL | Status: DC
  Administered 2013-03-31 – 2013-04-03 (×10): 650 mg via ORAL
  Filled 2013-03-31 (×12): qty 1

## 2013-03-31 NOTE — Progress Notes (Addendum)
[facility] Progress Note  TEAM 1 - Stepdown/ICU TEAM   [name] LKG:[id number] DOB: 1946/02/13 DOA: 03/30/2013 PCP: Salena Saner., MD  Brief narrative: 67 year old female patient with a one-week history of cough associated subjective fevers. Diagnosed with bronchitis by her primary care physician one week prior and started on Z-Pak and prednisone. Unfortunately symptoms never improved and she developed anorexia and poor intake as well as hemoptysis. Her fever improved. Apparent 10 pound weight loss over the past week. Apparent recent blood work at her primary care physician and she was informed that her calcium level was high and her renal function was abnormal but was not told that her sodium was low. Outpatient renal ultrasound apparently ordered by primary care physician but result is unavailable in Epic. At presentation to the ER she was found to have profound hyponatremia with a sodium of 105. Of note patient is also on Maxzide at home. Her BUN is elevated but in review of previous record from 2010 her creatinine appears to be at baseline although we do not have a current creatinine for comparison. In addition to the above findings patient had a marked leukocytosis with a white count of 49,300 with a left shift, neutrophils 92%. This is in the absence of fevers at presentation. Her serum CO2 was low at 17 and subsequent lactic acid post admission was 4.1. She was given normal saline IV fluids in the emergency department prior to obtaining SIADH labs.  Assessment/Plan:  Hyponatremia-severe -etiology seems primarily 2/2 profound dehydration as Na+ continues to rise after hydration; SIADH labs not helpful given pre admit use of thiazide diuretics and receipt of NS before labs obtained -given her smoking history and mass- -improving with NS  Acute respiratory failure with hypoxia/B/L upper lobe pneumonia/Cough with hemoptysis - cont current antibiotics  Lung Nodule -CT  Chest- reveals small nodule in Left lung - will need outpt f/u with pulm  Weight loss (Acute) -suspect due to [facility] and anorexia from HypoNa+  Dysphagia - if this continues while in the hospital, will need barium swallow  Thiazide diuretics causing adverse effect in therapeutic use -see above re: hypoNa+  Acute renal failure on CKD stage 4, GFR 15-29 ml/min -BUN trending down with hydration - Scr stable and at baseline for 2010  Renal mass -MRI will need to be performed prior to d/c when resp status improves  Leukocytosis - marked -suspect due to [facility], acute infection and recent steroids - follow trends - if persists may need heme eval  Liver disease/? Cirrhosis -had remote liver biopsy ?cirrhosis -LFT's non obstructing pattern  Hypothyroidism -On Synthroid at home -TSH low and indicative of sick euthyroid syndrome  Tobacco abuse -quit < 1 month ago  DVT prophylaxis: SCDs Code Status: Full Family Communication: Patient and her family at bedside Disposition Plan: follow in SDU Isolation: None  Consultants: None  Procedures: None  Antibiotics: Levaquin 4/7 >>>  HPI/Subjective: Patient feels much stronger today- continues to have anorexia- she also mentions a feeling of food getting stuck in her chest which started a couple of days ago after the respiratory symptoms started- she is quite vague about it  Objective: Blood pressure 125/50, pulse 81, temperature 97.5 F (36.4 C), temperature source Oral, resp. rate 20, height $RemoveBe'5\' 4"'DLDrdHubb$  (1.626 m), weight 52.1 kg (114 lb 13.8 oz), SpO2 100.00%.  Intake/Output Summary (Last 24 hours) at 03/31/13 1659 Last data filed at 03/31/13 1400  Gross per 24 hour  Intake   2460 ml  Output  2225 ml  Net    235 ml   Exam: General: No acute respiratory distress  Lungs: Clear to auscultation bilaterally without wheezes or crackles  Cardiovascular: Regular rate and rhythm without murmur gallop or rub normal S1 and S2  Abdomen:  Nontender, nondistended, soft, bowel sounds positive, no rebound, no ascites, no appreciable mass  Extremities: No significant cyanosis, clubbing, or edema bilateral lower extremities   Data Reviewed: Basic Metabolic Panel:  Recent Labs Lab 03/30/13 0732 03/30/13 0913 03/30/13 1939 03/31/13 0520 03/31/13 1135 03/31/13 1341  NA 110* 109* 117* 123* 125*  --   K 4.6 4.9 5.2* 5.4* 6.0*  --   CL 81* 80* 89* 96 98  --   CO2 15* 13* 16* 16* 16*  --   GLUCOSE 114* 122* 195* 114* 111*  --   BUN 48* 47* 42* 40* 40*  --   CREATININE 1.96* 1.87* 1.74* 1.68* 1.58*  --   CALCIUM 9.1 9.2 9.1 9.2 9.4  --   MG  --   --   --   --   --  2.5   Liver Function Tests:  Recent Labs Lab 03/30/13 0146 03/30/13 0319  AST 94* 77*  ALT 75* 64*  ALKPHOS 263* 218*  BILITOT 0.8 0.8  PROT 7.3 6.3  ALBUMIN 3.0* 2.5*   CBC:  Recent Labs Lab 03/30/13 0146 03/30/13 0319 03/30/13 0732 03/31/13 0520  WBC 49.3* 48.4* 58.9* 45.6*  NEUTROABS 45.3* 46.0* 55.9*  --   HGB 8.9* 10.5* 11.0* 11.4*  HCT 23.9* 28.0* 31.7* 30.3*  MCV 92.3 92.1 91.2 89.9  PLT 150 139* 171 194   Cardiac Enzymes:  Recent Labs Lab 03/30/13 0146  TROPONINI <0.30     Recent Results (from the past 240 hour(s))  MRSA PCR SCREENING     Status: None   Collection Time    03/30/13  6:38 AM      Result Value Range Status   MRSA by PCR NEGATIVE  NEGATIVE Final   Comment:            The GeneXpert MRSA Assay (FDA     approved for NASAL specimens     only), is one component of a     comprehensive MRSA colonization     surveillance program. It is not     intended to diagnose MRSA     infection nor to guide or     monitor treatment for     MRSA infections.  CULTURE, BLOOD (ROUTINE X 2)     Status: None   Collection Time    03/30/13  7:04 AM      Result Value Range Status   Specimen Description BLOOD FOREARM LEFT   Final   Special Requests BOTTLES DRAWN AEROBIC ONLY 8CC   Final   Culture  Setup Time 03/30/2013 12:47    Final   Culture     Final   Value:        BLOOD CULTURE RECEIVED NO GROWTH TO DATE CULTURE WILL BE HELD FOR 5 DAYS BEFORE ISSUING A FINAL NEGATIVE REPORT   Report Status PENDING   Incomplete  CULTURE, BLOOD (ROUTINE X 2)     Status: None   Collection Time    03/30/13  7:20 AM      Result Value Range Status   Specimen Description BLOOD HAND LEFT   Final   Special Requests BOTTLES DRAWN AEROBIC ONLY 5CC   Final   Culture  Setup Time 03/30/2013 12:47  Final   Culture     Final   Value:        BLOOD CULTURE RECEIVED NO GROWTH TO DATE CULTURE WILL BE HELD FOR 5 DAYS BEFORE ISSUING A FINAL NEGATIVE REPORT   Report Status PENDING   Incomplete     Studies:  Recent x-ray studies have been reviewed in detail by the Attending Physician  Scheduled Meds:  Reviewed in detail by the Attending Physician   Debbe Odea, MD [facility] Office  [phone] Pager [phone]  On-Call/Text Page:      [email or web address]      password [email or web address]  If 7PM-7AM, please contact night-coverage [email or web address] Password [email or web address] 03/31/2013, 4:59 PM   LOS: 1 day

## 2013-03-31 NOTE — Progress Notes (Signed)
Subjective:  Feeling better every day No shortness of breath Able to eat Still getting NS with progressive improvement in sodium, excellent UOP  Objective:     Vital signs in last 24 hours: Filed Vitals:   03/30/13 2350 03/31/13 0435 03/31/13 0756 03/31/13 0800  BP:  145/67  132/61  Pulse: 69 76  88  Temp: 98.5 F (36.9 C) 98 F (36.7 C) 98 F (36.7 C) 98.6 F (37 C)  TempSrc: Oral Oral Oral Oral  Resp: $Remo'17 19  21  'HWbqP$ Height:      Weight:      SpO2: 100% 100%  100%   Weight change:   Intake/Output Summary (Last 24 hours) at 03/31/13 1119 Last data filed at 03/31/13 0800  Gross per 24 hour  Intake   2340 ml  Output   2600 ml  Net   -260 ml    Physical Exam:  Blood pressure 132/61, pulse 88, temperature 98.6 F (37 C), temperature source Oral, resp. rate 21, height $RemoveBe'5\' 4"'OGDTMWMOS$  (1.626 m), weight 52.1 kg (114 lb 13.8 oz), SpO2 100.00%. Small framed perky BF NAD Wearing O2 Neck: no JVD Chest: Rhonchous breath sounds bilaterally  Heart: regular, no rub or gallop  Abdomen: soft, no focal tenderness  Ext: no edema of lower exts Neuro: alert, Ox3, no focal deficit   Labs:   Recent Labs Lab 03/30/13 0146 03/30/13 0319 03/30/13 0732 03/30/13 0913 03/30/13 1939 03/31/13 0520  NA 105* 106* 110* 109* 117* 123*  K 5.5* 4.9 4.6 4.9 5.2* 5.4*  CL 75* 78* 81* 80* 89* 96  CO2 17* 15* 15* 13* 16* 16*  GLUCOSE 107* 119* 114* 122* 195* 114*  BUN 51* 51* 48* 47* 42* 40*  CREATININE 2.20* 2.11* 1.96* 1.87* 1.74* 1.68*  CALCIUM 9.8 9.0 9.1 9.2 9.1 9.2     Recent Labs Lab 03/30/13 0146 03/30/13 0319  AST 94* 77*  ALT 75* 64*  ALKPHOS 263* 218*  BILITOT 0.8 0.8  PROT 7.3 6.3  ALBUMIN 3.0* 2.5*    Recent Labs Lab 03/30/13 0146 03/30/13 0319 03/30/13 0732 03/31/13 0520  WBC 49.3* 48.4* 58.9* 45.6*  NEUTROABS 45.3* 46.0* 55.9*  --   HGB 8.9* 10.5* 11.0* 11.4*  HCT 23.9* 28.0* 31.7* 30.3*  MCV 92.3 92.1 91.2 89.9  PLT 150 139* 171 194     Studies/Results: Dg  Chest 2 View  03/30/2013  *RADIOLOGY REPORT*  Clinical Data: Productive cough and shortness of breath; history of smoking.  CHEST - 2 VIEW  Comparison: None.  Findings: Right upper lobe and more mild left upper lobe airspace opacities are concerning for multifocal pneumonia.  No definite pleural effusion or pneumothorax is seen.  The appearance is atypical for mass.  The heart is normal in size.  No acute osseous abnormalities are seen.  There is interposition of the hepatic flexure of the colon superior to the liver.  IMPRESSION: Right upper lobe and more mild left upper lobe airspace opacities, concerning for multifocal pneumonia.   Original Report Authenticated By: Santa Lighter, M.D.    Ct Chest Wo Contrast  03/30/2013  *RADIOLOGY REPORT*  Clinical Data: Evaluate right upper lobe opacity seen on chest radiograph.  CT CHEST WITHOUT CONTRAST  Technique:  Multidetector CT imaging of the chest was performed following the standard protocol without IV contrast.  Comparison: Chest radiograph 03/30/2013 and 07/03/2012.  Findings: No pathologically enlarged mediastinal or axillary lymph nodes.  Hilar regions are difficult to definitively evaluate without IV contrast.  Coronary artery  calcification.  Heart size normal.  No pericardial effusion.  There is thickening of the distal esophageal wall, which can be seen with gastroesophageal reflux disease.  Esophagus is mildly dilated and air filled through at its course, indicative of dysmotility.  Tiny bilateral pleural effusions.  Emphysema.  Airspace consolidation is worst in the right upper lobe, with some involvement of the left upper lobe as well.  Nodular density in the medial left lower lobe measures 6 mm (image 26).  Adherent debris is seen in the trachea which is otherwise unremarkable.  Incidental imaging of the upper abdomen shows an extremely irregular liver margin.  No worrisome lytic or sclerotic lesions.  IMPRESSION:  1.  Patchy airspace consolidation in the  upper lobes, right greater than left, most consistent with pneumonia. Tiny bilateral pleural effusions.  Follow-up to clearing could be performed, as clinically indicated. 2.  6 mm nodular density in the medial left lower lobe. Given risk factors for bronchogenic carcinoma, follow-up chest CT at 6 - 12 months is recommended.  This recommendation follows the consensus statement: Guidelines for Management of Small Pulmonary Nodules Detected on CT Scans: A Statement from the [place] as published in Radiology 2005; 237:395-400. 3.  Coronary artery calcification. 4.  Cirrhosis.   Original Report Authenticated By: Lorin Picket, M.D.    US Renal  03/31/2013  *RADIOLOGY REPORT*  Clinical Data: Chronic kidney disease.  RENAL/URINARY TRACT ULTRASOUND COMPLETE  Comparison:  No priors.  Findings:  Right Kidney:  Diffusely echogenic.  Multiple small lesions which appear anechoic with increased through transmission, largest of which measures only 1.1 x 0.9 x 0.9 cm, likely represent small cysts.  No hydronephrosis.  10.8 cm in length.  Left Kidney:  9.5 cm in length.  Diffusely echogenic.  Multiple lesions, the majority of which appeared represents small simple cyst.  However, in the lower pole there is an area of complex echogenicity measuring approximately 1.5 x 0.9 x 1.3 cm.  This lesion is indeterminate.  No hydronephrosis.   A small amount of surrounding perinephric fluid.  Bladder:  Well distended with an irregular contour of the anterolateral aspect of the urinary bladder on the right side, suspicious for a urinary bladder diverticulum.  IMPRESSION: 1. Indeterminate 1.5 x 0.9 x 1.3 cm lesion in the lower pole of the left kidney.  The possibility of a neoplasm is not excluded, although this may simply represent a complex cyst.  Further characterization with MRI with and without gadolinium is suggested to exclude malignancy. 2.  Echogenic renal parenchyma bilaterally, suggesting underlying medical renal  disease. 3.  Small amount of perinephric fluid around the left kidney. 4.  Probable bladder wall diverticulum, as above.   Original Report Authenticated By: Vinnie Langton, M.D.     . sodium chloride 125 mL/hr at 03/31/13 0900   . heparin subcutaneous  5,000 Units Subcutaneous Q8H  . [START ON 04/01/2013] levofloxacin (LEVAQUIN) IV  750 mg Intravenous Q48H  . levothyroxine  88 mcg Oral QAC breakfast     I  have reviewed scheduled and prn medications.  ASSESSMENT/RECOMMENDATIONS  67 year old BF with known CKD 3/4 (Nephrology w/u in past - 2010- Dr. Justin Mend - felt d/t nephrosclerosis), longstanding hypertension who presents with largely asymptomatic sodium of 105, AKI on CKD in the setting of  bilateral upper lobe PNA, volume depletion, weight loss and on thiazide diuretic as outpt.  Hyponatremia Continued and steady improvement with IV NS and discontinuation of thiazide Expect continued improvement Would consider alternate  to thiazide containing diuretics for outpt use if diuretic is needed (although not primary etiology)  AKI on CKD Renal function has improved with hydration Kidneys very echodense  Indeterminate renal cystic lesion on [place] MRI WITHOUT contrast would be reasonable to evaluate  Metabolic acidosis Add oral sodium bicarb  Bilateral upper lobe PNA/profound leukocytosis WBC improving Antibiotics per primary service   From renal standpoint, no additional recommendations other than those above. Happy to re-establish followup with [facility] after discharge We will call her with an appointment late May or early June. Will sign off at this time - call if questions arise.   Jamal Maes, MD [facility] [phone] Pager 03/31/2013, 11:19 AM

## 2013-03-31 NOTE — Evaluation (Signed)
Physical Therapy Evaluation Patient Details Name: [name] MRN: [id number] DOB: 11-23-1946 Today's Date: 03/31/2013 Time: 1848-5927 PT Time Calculation (min): 22 min  PT Assessment / Plan / Recommendation Clinical Impression  Patient is a 67 y/o female admitted with pna and severe hyponatremia.  She presents with decreased independence with mobility due to weakness, poor activity tolerance, decreased balance and will benefit from skilled PT in the acute setting to maximize independence and allow d/c home with family assist and HHPT.    PT Assessment  Patient needs continued PT services    Follow Up Recommendations  Home health PT;Supervision/Assistance - 24 hour          Equipment Recommendations  None recommended by PT    Recommendations for Other Services   OT consult  Frequency Min 3X/week    Precautions / Restrictions Precautions Precautions: Fall   Pertinent Vitals/Pain Denies pain      Mobility  Bed Mobility Bed Mobility: Sit to Supine;Supine to Sit Supine to Sit: 4: Min guard;HOB elevated Sit to Supine: 4: Min guard Details for Bed Mobility Assistance: assist due to generalized weakness and increased work of breathing with mobiltiy Transfers Transfers: Sit to Stand;Stand to Sit Sit to Stand: 4: Min assist;From bed Stand to Sit: 4: Min assist;To bed Details for Transfer Assistance: unsteady on her feet Ambulation/Gait Ambulation/Gait Assistance: 4: Min assist Ambulation Distance (Feet): 85 Feet Assistive device: None Ambulation/Gait Assistance Details: pt held PT around waist for support, standing rest leaning on wall before turning around to walk back to room. Gait Pattern: Step-through pattern;Decreased stride length;Trunk flexed;Narrow base of support;Shuffle        PT Diagnosis: Abnormality of gait;Generalized weakness  PT Problem List: Decreased strength;Decreased activity tolerance;Decreased balance;Decreased mobility;Cardiopulmonary status limiting  activity PT Treatment Interventions: DME instruction;Gait training;Stair training;Patient/family education;Functional mobility training;Therapeutic activities;Therapeutic exercise;Balance training   PT Goals Acute Rehab PT Goals PT Goal Formulation: With patient Time For Goal Achievement: 04/14/13 Potential to Achieve Goals: Good Pt will go Supine/Side to Sit: with modified independence PT Goal: Supine/Side to Sit - Progress: Goal set today Pt will go Sit to Stand: with modified independence PT Goal: Sit to Stand - Progress: Goal set today Pt will go Stand to Sit: with modified independence PT Goal: Stand to Sit - Progress: Goal set today Pt will Stand: with modified independence;with unilateral upper extremity support;3 - 5 min PT Goal: Stand - Progress: Goal set today Pt will Ambulate: >150 feet;with modified independence;with least restrictive assistive device PT Goal: Ambulate - Progress: Goal set today Pt will Go Up / Down Stairs: 3-5 stairs;with min assist PT Goal: Up/Down Stairs - Progress: Goal set today  Visit Information  Last PT Received On: 03/31/13 Assistance Needed: +1    Subjective Data  Subjective: Can't believe how sick I was.  Now can't believe how weak I am. Patient Stated Goal: To return home with family assist   Prior Functioning  Home Living Lives With: Spouse Available Help at Discharge: Family;Available PRN/intermittently Type of Home: House Home Access: Stairs to enter [employer] of Steps: 3 Entrance Stairs-Rails: None Home Layout: One level Firefighter: Standard Home Adaptive Equipment: Walker - rolling;Bedside commode/3-in-1 Additional Comments: spouse can't assist, but multiple family in area can take turns Prior Function Level of Independence: Independent Able to Take Stairs?: Yes Driving: No Communication Communication: HOH    Cognition  Cognition Overall Cognitive Status: Appears within functional limits for tasks  assessed/performed Arousal/Alertness: Awake/alert Orientation Level: Appears intact for tasks assessed Behavior  During Session: [facility] for tasks performed    Extremity/Trunk Assessment Right Lower Extremity Assessment RLE ROM/Strength/Tone: [facility] for tasks assessed RLE Sensation: WFL - Light Touch Left Lower Extremity Assessment LLE Sensation: WFL - Light Touch   Balance Balance Balance Assessed: Yes Static Sitting Balance Static Sitting - Balance Support: Feet supported;Left upper extremity supported Static Sitting - Level of Assistance: 5: Stand by assistance  End of Session PT - End of Session Equipment Utilized During Treatment: Gait belt;Oxygen Activity Tolerance: Patient limited by fatigue Patient left: in bed;with call bell/phone within reach;with family/visitor present  GP     [facility] 03/31/2013, 5:09 PM  Magda Kiel, [place] 03/31/2013

## 2013-04-01 LAB — BASIC METABOLIC PANEL
CO2: 18 mEq/L — ABNORMAL LOW (ref 19–32)
Chloride: 101 mEq/L (ref 96–112)
Chloride: 101 mEq/L (ref 96–112)
Creatinine, Ser: 1.45 mg/dL — ABNORMAL HIGH (ref 0.50–1.10)
Creatinine, Ser: 1.42 mg/dL — ABNORMAL HIGH (ref 0.50–1.10)
GFR calc Af Amer: 42 mL/min — ABNORMAL LOW (ref >90)
Potassium: 4.9 mEq/L (ref 3.5–5.1)
Sodium: 129 mEq/L — ABNORMAL LOW (ref 135–145)

## 2013-04-01 LAB — CBC
HCT: 28.5 % — ABNORMAL LOW (ref 36.0–46.0)
Hemoglobin: 10.5 g/dL — ABNORMAL LOW (ref 12.0–15.0)
RDW: 12 % (ref 11.5–15.5)
WBC: 28.2 10*3/uL — ABNORMAL HIGH (ref 4.0–10.5)

## 2013-04-01 MED ORDER — ALBUTEROL SULFATE HFA 108 (90 BASE) MCG/ACT IN AERS: 2.0000 | INHALATION_SPRAY | RESPIRATORY_TRACT | Status: DC | PRN

## 2013-04-01 NOTE — Progress Notes (Signed)
[facility] Progress Note [name] TEAM 1 - Stepdown/ICU TEAM   [name] YIF:[id number] DOB: Aug 02, 1946 DOA: 03/30/2013 PCP: Salena Saner., MD  Brief narrative: 67 year old female patient with a one-week history of cough associated subjective fevers. Diagnosed with bronchitis by her primary care physician one week prior and started on Z-Pak and prednisone. Unfortunately symptoms never improved and she developed anorexia and poor intake as well as hemoptysis. Her fever improved. Apparent 10 pound weight loss over the past week. Apparent recent blood work at her primary care physician and she was informed that her calcium level was high and her renal function was abnormal but was not told that her sodium was low. Outpatient renal ultrasound apparently ordered by primary care physician but result is unavailable in Epic. At presentation to the ER she was found to have profound hyponatremia with a sodium of 105. Of note patient is also on Maxzide at home. Her BUN is elevated but in review of previous record from 2010 her creatinine appears to be at baseline although we do not have a current creatinine for comparison. In addition to the above findings patient had a marked leukocytosis with a white count of 49,300 with a left shift, neutrophils 92%. This is in the absence of fevers at presentation. Her serum CO2 was low at 17 and subsequent lactic acid post admission was 4.1. She was given normal saline IV fluids in the emergency department prior to obtaining SIADH labs.  Assessment/Plan:  Hyponatremia-severe -etiology seems primarily 2/2 profound dehydration confound by use of thiazide diuretic as Na+ continues to rise after hydration; SIADH labs not helpful given pre admit use of thiazide diuretics and receipt of NS before labs obtained -improving with NS- decrease rate to 75/hr  Acute respiratory failure with hypoxia / B/L upper lobe pneumonia/Cough with hemoptysis - cont current  antibiotics  Lung Nodule -CT Chest- reveals small nodule in Left lung - will need outpt f/u with pulm and surveillance CT Chest in 6 months -discussed with patient   Renal lesion -incidental finding on renal US - nephro rec non contrasted MRI to better clarify - consider soon if creatinine continues to stabilize  Weight loss (Acute) -suspect due to [facility] and anorexia from HypoNa+  Dysphagia - has improved with rehydration and improvement in Na+ - tolerating solid diet at this time  Thiazide diuretics causing adverse effect in therapeutic use -see above re: hypoNa+ - would be advisable to avoid future use thiazides in this patient  Acute renal failure on CKD stage 4, GFR 15-29 ml/min -BUN trending down with hydration - Scr stablizing  Leukocytosis - marked -suspect due to [facility], acute infection and recent steroids  -trend is down/now ~25,000 (peak > 50,000)  Liver disease/? Cirrhosis -had remote liver biopsy ?cirrhosis -LFT's non obstructing pattern  Hypothyroidism -On Synthroid at home -TSH low and indicative of sick euthyroid syndrome  Tobacco abuse -quit < 1 month ago  DVT prophylaxis: SCDs Code Status: Full Family Communication: Patient and her family at bedside Disposition Plan: Transfer to telemetry Isolation: None  Consultants: None  Procedures: None  Antibiotics: Levaquin 4/7 >>>  HPI/Subjective: Patient excited that can swallow without pain! No other complaints verbalized.  Continues to feel weak all over though improving markedly.    Objective: Blood pressure 128/51, pulse 91, temperature 98.4 F (36.9 C), temperature source Oral, resp. rate 18, height $RemoveBe'5\' 4"'PqrnDfNYo$  (1.626 m), weight 52.1 kg (114 lb 13.8 oz), SpO2 98.00%.  Intake/Output Summary (Last 24 hours) at 04/01/13 1450 Last  data filed at 04/01/13 1200  Gross per 24 hour  Intake   2105 ml  Output   2025 ml  Net     80 ml   Exam: General: No acute respiratory distress  Lungs: Clear to auscultation  bilaterally without wheezes or crackles  Cardiovascular: Regular rate and rhythm without murmur gallop or rub normal S1 and S2  Abdomen: Nontender, nondistended, soft, bowel sounds positive, no rebound, no ascites, no appreciable mass  Extremities: No significant cyanosis, clubbing, or edema bilateral lower extremities   Data Reviewed: Basic Metabolic Panel:  Recent Labs Lab 03/31/13 1135 03/31/13 1341 03/31/13 2033 03/31/13 2202 04/01/13 0340 04/01/13 1205  NA 125*  --  124* 124* 129* 128*  K 6.0*  --  4.9 4.8 4.9 4.6  CL 98  --  96 96 101 101  CO2 16*  --  18* 18* 18* 18*  GLUCOSE 111*  --  115* 95 82 86  BUN 40*  --  39* 38* 37* 35*  CREATININE 1.58*  --  1.58* 1.52* 1.45* 1.42*  CALCIUM 9.4  --  9.5 9.5 9.2 9.0  MG  --  2.5  --   --   --   --    Liver Function Tests:  Recent Labs Lab 03/30/13 0146 03/30/13 0319  AST 94* 77*  ALT 75* 64*  ALKPHOS 263* 218*  BILITOT 0.8 0.8  PROT 7.3 6.3  ALBUMIN 3.0* 2.5*   CBC:  Recent Labs Lab 03/30/13 0146 03/30/13 0319 03/30/13 0732 03/31/13 0520 04/01/13 0340  WBC 49.3* 48.4* 58.9* 45.6* 28.2*  NEUTROABS 45.3* 46.0* 55.9*  --   --   HGB 8.9* 10.5* 11.0* 11.4* 10.5*  HCT 23.9* 28.0* 31.7* 30.3* 28.5*  MCV 92.3 92.1 91.2 89.9 90.5  PLT 150 139* 171 194 177   Cardiac Enzymes:  Recent Labs Lab 03/30/13 0146  TROPONINI <0.30     Recent Results (from the past 240 hour(s))  MRSA PCR SCREENING     Status: None   Collection Time    03/30/13  6:38 AM      Result Value Range Status   MRSA by PCR NEGATIVE  NEGATIVE Final   Comment:            The GeneXpert MRSA Assay (FDA     approved for NASAL specimens     only), is one component of a     comprehensive MRSA colonization     surveillance program. It is not     intended to diagnose MRSA     infection nor to guide or     monitor treatment for     MRSA infections.  CULTURE, BLOOD (ROUTINE X 2)     Status: None   Collection Time    03/30/13  7:04 AM       Result Value Range Status   Specimen Description BLOOD FOREARM LEFT   Final   Special Requests BOTTLES DRAWN AEROBIC ONLY 8CC   Final   Culture  Setup Time 03/30/2013 12:47   Final   Culture     Final   Value:        BLOOD CULTURE RECEIVED NO GROWTH TO DATE CULTURE WILL BE HELD FOR 5 DAYS BEFORE ISSUING A FINAL NEGATIVE REPORT   Report Status PENDING   Incomplete  CULTURE, BLOOD (ROUTINE X 2)     Status: None   Collection Time    03/30/13  7:20 AM      Result Value Range  Status   Specimen Description BLOOD HAND LEFT   Final   Special Requests BOTTLES DRAWN AEROBIC ONLY 5CC   Final   Culture  Setup Time 03/30/2013 12:47   Final   Culture     Final   Value:        BLOOD CULTURE RECEIVED NO GROWTH TO DATE CULTURE WILL BE HELD FOR 5 DAYS BEFORE ISSUING A FINAL NEGATIVE REPORT   Report Status PENDING   Incomplete     Studies:  Recent x-ray studies have been reviewed in detail by the Attending Physician  Scheduled Meds:  Reviewed in detail by the Attending Physician   Erin Hearing, [place] [facility] Office  [phone] Pager [phone]  On-Call/Text Page:      [email or web address]      password [email or web address]  If 7PM-7AM, please contact night-coverage [email or web address] Password [email or web address] 04/01/2013, 2:50 PM   LOS: 2 days    I have personally examined this patient and reviewed the entire database. I have reviewed the above note, made any necessary editorial changes, and agree with its content.  Cherene Altes, MD [facility]

## 2013-04-01 NOTE — Progress Notes (Signed)
Physical Therapy Treatment Patient Details Name: [name] MRN: [id number] DOB: 14-Jun-1946 Today's Date: 04/01/2013 Time: 9163-8466 PT Time Calculation (min): 24 min  PT Assessment / Plan / Recommendation Comments on Treatment Session  Patient progressing well with mobility and activity tolerance.  More independent with use of walker.  No oxygen today and noted less work of breathing as compared to yesterday.  Family in room and attentive.      Follow Up Recommendations  Home health PT;Supervision/Assistance - 24 hour           Equipment Recommendations  None recommended by PT    Recommendations for Other Services  OT eval  Frequency Min 3X/week   Plan Discharge plan remains appropriate    Precautions / Restrictions Precautions Precautions: Fall Restrictions Weight Bearing Restrictions: No   Pertinent Vitals/Pain Denies pain    Mobility  Bed Mobility Bed Mobility: Scooting to HOB Supine to Sit: HOB elevated;5: Supervision;With rails Sit to Supine: HOB elevated;5: Supervision Scooting to [facility]: 5: Supervision Details for Bed Mobility Assistance: cues for positioning in bed Transfers Sit to Stand: 4: Min guard;From bed Stand to Sit: 4: Min guard;To bed Details for Transfer Assistance: cues for positioning and hand placement Ambulation/Gait Ambulation/Gait Assistance: 4: Min assist;4: Min guard Ambulation Distance (Feet): 150 Feet Ambulation/Gait Assistance Details: no loss of balance noted walking with walker today Gait Pattern: Decreased hip/knee flexion - right;Decreased hip/knee flexion - left;Decreased stride length;Step-through pattern;Wide base of support    Exercises General Exercises - Lower Extremity Long Arc Quad: AROM;Both;10 reps;Seated Hip Flexion/Marching: AROM;10 reps;Seated;Both Toe Raises: AROM;Both;15 reps;Seated Heel Raises: AROM;Both;15 reps;Seated Other Exercises Other Exercises: sit<>stand from bed no UE assist x 5 reps    PT Goals Acute  Rehab PT Goals Pt will go Supine/Side to Sit: with modified independence PT Goal: Supine/Side to Sit - Progress: Progressing toward goal Pt will go Sit to Stand: with modified independence PT Goal: Sit to Stand - Progress: Progressing toward goal Pt will go Stand to Sit: with modified independence PT Goal: Stand to Sit - Progress: Progressing toward goal Pt will Stand: with modified independence;with unilateral upper extremity support;3 - 5 min PT Goal: Stand - Progress: Progressing toward goal Pt will Ambulate: >150 feet;with modified independence;with least restrictive assistive device PT Goal: Ambulate - Progress: Progressing toward goal  Visit Information  Last PT Received On: 04/01/13    Subjective Data  Subjective: Been getting up to bedside commode on my own overnight.   Cognition  Cognition Overall Cognitive Status: Appears within functional limits for tasks assessed/performed Arousal/Alertness: Awake/alert Orientation Level: Appears intact for tasks assessed Behavior During Session: [facility] for tasks performed    Balance  Static Standing Balance Static Standing - Balance Support: Bilateral upper extremity supported Static Standing - Level of Assistance: 5: Stand by assistance  End of Session PT - End of Session Equipment Utilized During Treatment: Gait belt Activity Tolerance: Patient tolerated treatment well Patient left: in bed;with call bell/phone within reach;with family/visitor present   GP     [facility] 04/01/2013, 4:07 PM Magda Kiel, [place] 04/01/2013

## 2013-04-02 ENCOUNTER — Inpatient Hospital Stay (HOSPITAL_COMMUNITY): Payer: Medicare Other

## 2013-04-02 DIAGNOSIS — D72829 Elevated white blood cell count, unspecified: Secondary | ICD-10-CM

## 2013-04-02 LAB — BASIC METABOLIC PANEL
CO2: 19 mEq/L (ref 19–32)
Chloride: 103 mEq/L (ref 96–112)
Glucose, Bld: 78 mg/dL (ref 70–99)
Potassium: 4.6 mEq/L (ref 3.5–5.1)
Sodium: 128 mEq/L — ABNORMAL LOW (ref 135–145)

## 2013-04-02 MED ORDER — SENNOSIDES-DOCUSATE SODIUM 8.6-50 MG PO TABS
1.0000 | ORAL_TABLET | Freq: Two times a day (BID) | ORAL | Status: DC
  Administered 2013-04-02 – 2013-04-03 (×2): 1 via ORAL
  Filled 2013-04-02 (×3): qty 1

## 2013-04-02 NOTE — Progress Notes (Signed)
[facility] PROGRESS NOTE  [name] XAJ:[id number] DOB: 07-Sep-1946 DOA: 03/30/2013 PCP: Salena Saner., MD  Brief narrative:  67 year old female patient with a one-week history of cough associated subjective fevers. She was started on Z-Pak and prednisone by her PCP for bronchitis one week back. However her symptoms did not improve and developed poor by mouth intake and some hemoptysis. As the patient she had blood work done and was told to have her elevate calcium and worsened renal function. She presented to the ER and was found to have profound hyponatremia with a sodium of 105. Patient also had a marked leukocytosis with a white count of 49,300 with a left shift, neutrophils 92%.  Her serum CO2 was low at 17 and subsequent lactic acid post admission was 4.1. Patient admitted to step down for closer monitoring.  Assessment/Plan:  Severe hyponatremia -likely secondary to dehydration further worsened with thiazide  -improving with NS.  Acute respiratory failure with hypoxia  Patient presented with / B/L upper lobe pneumonia  - continue levaquin. Completes on 4/11 -needs follow up CXR in 4 weeks for clearing.  Lung Nodule  -CT Chest- reveals small nodule in Left lung. Needs follow up CT chest in 6-12 mths as outpt. -  Left Renal lesion  -incidental finding on renal US . Recommended MRI to better define the lesion. Will order non contrast MRI.    Dysphagia  -improved with rehydration and improvement in sodium level.  - tolerating diet at this time     Acute renal failure on CKD stage 4, GFR 15-29 ml/min  Renal function improving with hydration  Leukocytosis  -possibly due to dehtydration, acute infection and recent steroids  -trending down  Liver disease/? Cirrhosis  -had remote liver biopsy ?cirrhosis  -LFT's non obstructing pattern   Hypothyroidism  -On Synthroid  -TSH low possibly due to sick euthyroid  Tobacco abuse  -quit < 1 month ago . Counseled on  cessation  DVT prophylaxis: SCD  Code Status: Full  Family Communication: Daughter  at bedside    Dispo: home with [facility] likely tomorrow if continues to improve  Consultants:   None    procedures None   Antibiotics:  Levaquin 4/7 >>>until 4/11   HPI/Subjective:  Patient seen and examined this am. informs feeling better.    Objective: Filed Vitals:   04/01/13 1350 04/01/13 1509 04/01/13 2151 04/02/13 0515  BP: 128/51  138/58 124/59  Pulse: 91 115 84 96  Temp: 98.4 F (36.9 C)  99.5 F (37.5 C) 98.6 F (37 C)  TempSrc: Oral  Oral Oral  Resp: $Remo'18  17 18  'YKWfX$ Height:      Weight:      SpO2: 98%  96% 96%    Intake/Output Summary (Last 24 hours) at 04/02/13 1505 Last data filed at 04/01/13 1700  Gross per 24 hour  Intake    240 ml  Output      0 ml  Net    240 ml   Filed Weights   03/30/13 0533  Weight: 52.1 kg (114 lb 13.8 oz)    Exam:   General: elderly female in NAD  HEENT: no pallor, moist oral mucosa  Cardiovascular: CN4&B0, 3/6 systolic murmur  Respiratory: clear b/l , no added sounds  Abdomen: soft, NT, ND, BS+  Musculoskeletal: warm, no edema  CNS: AAOX3   Data Reviewed: Basic Metabolic Panel:  Recent Labs Lab 03/31/13 1135 03/31/13 1341 03/31/13 2033 03/31/13 2202 04/01/13 0340 04/01/13 1205 04/02/13 0435  NA 125*  --  124* 124* 129* 128* 128*  K 6.0*  --  4.9 4.8 4.9 4.6 4.6  CL 98  --  96 96 101 101 103  CO2 16*  --  18* 18* 18* 18* 19  GLUCOSE 111*  --  115* 95 82 86 78  BUN 40*  --  39* 38* 37* 35* 28*  CREATININE 1.58*  --  1.58* 1.52* 1.45* 1.42* 1.36*  CALCIUM 9.4  --  9.5 9.5 9.2 9.0 8.9  MG  --  2.5  --   --   --   --   --    Liver Function Tests:  Recent Labs Lab 03/30/13 0146 03/30/13 0319  AST 94* 77*  ALT 75* 64*  ALKPHOS 263* 218*  BILITOT 0.8 0.8  PROT 7.3 6.3  ALBUMIN 3.0* 2.5*   No results found for this basename: LIPASE, AMYLASE,  in the last 168 hours No results found for this basename: AMMONIA,   in the last 168 hours CBC:  Recent Labs Lab 03/30/13 0146 03/30/13 0319 03/30/13 0732 03/31/13 0520 04/01/13 0340  WBC 49.3* 48.4* 58.9* 45.6* 28.2*  NEUTROABS 45.3* 46.0* 55.9*  --   --   HGB 8.9* 10.5* 11.0* 11.4* 10.5*  HCT 23.9* 28.0* 31.7* 30.3* 28.5*  MCV 92.3 92.1 91.2 89.9 90.5  PLT 150 139* 171 194 177   Cardiac Enzymes:  Recent Labs Lab 03/30/13 0146  TROPONINI <0.30   BNP (last 3 results) No results found for this basename: PROBNP,  in the last 8760 hours CBG: No results found for this basename: GLUCAP,  in the last 168 hours  Recent Results (from the past 240 hour(s))  MRSA PCR SCREENING     Status: None   Collection Time    03/30/13  6:38 AM      Result Value Range Status   MRSA by PCR NEGATIVE  NEGATIVE Final   Comment:            The GeneXpert MRSA Assay (FDA     approved for NASAL specimens     only), is one component of a     comprehensive MRSA colonization     surveillance program. It is not     intended to diagnose MRSA     infection nor to guide or     monitor treatment for     MRSA infections.  CULTURE, BLOOD (ROUTINE X 2)     Status: None   Collection Time    03/30/13  7:04 AM      Result Value Range Status   Specimen Description BLOOD FOREARM LEFT   Final   Special Requests BOTTLES DRAWN AEROBIC ONLY 8CC   Final   Culture  Setup Time 03/30/2013 12:47   Final   Culture     Final   Value:        BLOOD CULTURE RECEIVED NO GROWTH TO DATE CULTURE WILL BE HELD FOR 5 DAYS BEFORE ISSUING A FINAL NEGATIVE REPORT   Report Status PENDING   Incomplete  CULTURE, BLOOD (ROUTINE X 2)     Status: None   Collection Time    03/30/13  7:20 AM      Result Value Range Status   Specimen Description BLOOD HAND LEFT   Final   Special Requests BOTTLES DRAWN AEROBIC ONLY 5CC   Final   Culture  Setup Time 03/30/2013 12:47   Final   Culture     Final   Value:  BLOOD CULTURE RECEIVED NO GROWTH TO DATE CULTURE WILL BE HELD FOR 5 DAYS BEFORE ISSUING A  FINAL NEGATIVE REPORT   Report Status PENDING   Incomplete     Studies: No results found.  Scheduled Meds: . heparin subcutaneous  5,000 Units Subcutaneous Q8H  . levofloxacin (LEVAQUIN) IV  750 mg Intravenous Q48H  . levothyroxine  88 mcg Oral QAC breakfast  . sodium bicarbonate  650 mg Oral TID   Continuous Infusions: . sodium chloride 75 mL/hr at 04/02/13 0921      Time spent: 25 minutes    October Peery, [place]  [facility] Pager [phone]. If 7PM-7AM, please contact night-coverage at [email or web address], password [email or web address] 04/02/2013, 3:05 PM  LOS: 3 days

## 2013-04-03 DIAGNOSIS — I519 Heart disease, unspecified: Secondary | ICD-10-CM

## 2013-04-03 DIAGNOSIS — N2889 Other specified disorders of kidney and ureter: Secondary | ICD-10-CM | POA: Diagnosis present

## 2013-04-03 LAB — BASIC METABOLIC PANEL
CO2: 17 mEq/L — ABNORMAL LOW (ref 19–32)
Calcium: 8.9 mg/dL (ref 8.4–10.5)
Creatinine, Ser: 1.17 mg/dL — ABNORMAL HIGH (ref 0.50–1.10)
Glucose, Bld: 80 mg/dL (ref 70–99)

## 2013-04-03 LAB — CBC
HCT: 30.3 % — ABNORMAL LOW (ref 36.0–46.0)
Hemoglobin: 11.2 g/dL — ABNORMAL LOW (ref 12.0–15.0)
MCH: 33.9 pg (ref 26.0–34.0)
MCV: 91.8 fL (ref 78.0–100.0)
RBC: 3.3 MIL/uL — ABNORMAL LOW (ref 3.87–5.11)

## 2013-04-03 MED ORDER — SENNOSIDES-DOCUSATE SODIUM 8.6-50 MG PO TABS: 1.0000 | ORAL_TABLET | Freq: Every day | ORAL | Status: DC

## 2013-04-03 MED ORDER — METOPROLOL TARTRATE 25 MG PO TABS: 50.0000 mg | ORAL_TABLET | Freq: Two times a day (BID) | ORAL | Status: DC

## 2013-04-03 MED ORDER — SODIUM BICARBONATE 650 MG PO TABS: 650.0000 mg | ORAL_TABLET | Freq: Three times a day (TID) | ORAL | Status: AC

## 2013-04-03 MED ORDER — AMLODIPINE BESYLATE 5 MG PO TABS: 5.0000 mg | ORAL_TABLET | Freq: Every day | ORAL | Status: DC

## 2013-04-03 MED ORDER — AMLODIPINE BESYLATE 5 MG PO TABS
5.0000 mg | ORAL_TABLET | Freq: Every day | ORAL | Status: DC
Start: 2013-04-03 — End: 2013-04-03
  Administered 2013-04-03: 5 mg via ORAL
  Filled 2013-04-03: qty 1

## 2013-04-03 NOTE — Progress Notes (Signed)
Patient and family had been given discharge instructions and prescriptions  will discharge home as ordered. Sala Tague, Bettina Gavia RN

## 2013-04-03 NOTE — Progress Notes (Signed)
   CARE MANAGEMENT NOTE 04/03/2013  Patient:  [name], [name]   Account Number:  [id number]  Date Initiated:  03/30/2013  Documentation initiated by:  [facility]  Subjective/Objective Assessment:   67 yo female with one week of cough, subjective fevers, seen by her pcp in the last week dx with bronchitis.  She continued to feel poorly with poor po intake/ Chief Complaint:  Cough, not feeling well, not eating well     Action/Plan:   Home with spouse/ return hm with spouse   Anticipated DC Date:  04/02/2013   Anticipated DC Plan:  [place]  CM consult      [facility] Choice  HOME HEALTH   Choice offered to / List presented to:  C-4 Adult Children        HH arranged  HH-2 PT      [place].   Status of service:  Completed, signed off Medicare Important Message given?   (If response is "NO", the following Medicare IM given date fields will be blank) Date Medicare IM given:   Date Additional Medicare IM given:    Discharge Disposition:  [place]  Per UR Regulation:  Reviewed for med. necessity/level of care/duration of stay  If discussed at [place] of Stay Meetings, dates discussed:    Comments:  04/03/2013 1745 NCM attempted call to pt and number is disconnected. Call to alternate number, Dtr, [place]. Spoke to dtr and states her mother had [facility] in the past for [facility]. Faxed referral to [facility] for [facility] PT/OT. Jonnie Finner RN CCM Case Mgmt phone [phone]  03/30/13.Marland KitchenMarland KitchenFuller Mandril, RN, BSN, [place] [phone] Spoke with pt regarding discharge planning.  Pt denies having needs at this time.  CM to follow up prior to D/C home.

## 2013-04-03 NOTE — Discharge Summary (Signed)
Physician Discharge Summary  Zeola Brys PNT:[id number] DOB: [birth year] DOA: 03/30/2013  PCP: Salena Saner., MD  Admit date: 03/30/2013 Discharge date: 04/03/2013  Time spent: 40 minutes  Recommendations for Outpatient Follow-up:  1. Home with [facility] PT 2.  Follow up with PCP in 1 week to have cbc and renal function checked. Also needs CT of abdomen with and without contrast as outpatient once renal function is stable to evaluate left renal ? Mass seen on [place] and MRI.  3. Also needs repeat CXR in 4 weeks to evaluate clearing of PNA. A  Follow up chest CT needs to be done in 6-12 months for 6 mm nodular density noted in the medial left lower lobe.  4.  Dr Sanda Klein office will call for follow up in 1-2 months.   Discharge Diagnoses:   Principal Problem:   Severe Hyponatremia   Active Problems:   Acute respiratory failure with hypoxia   Acute renal failure   CKD (chronic kidney disease) stage 4, GFR 15-29 ml/min   Liver disease/? cirrhosis   Hypothyroidism   Right upper lobe consolidation Pulmonary nodule   Cough with hemoptysis   Tobacco abuse   Thiazide diuretics causing adverse effect in therapeutic use   Leukocytosis   Dehydration   Metabolic acidosis  ? Left renal mass   Discharge Condition: FAIR  Diet recommendation: regular  Filed Weights   03/30/13 0533  Weight: 52.1 kg (114 lb 13.8 oz)    History of present illness:  Please refer to admission H&P for details , but in brief, 67 year old female patient with a one-week history of cough associated subjective fevers. She was started on Z-Pak and prednisone by her PCP for bronchitis one week back. However her symptoms did not improve and developed poor by mouth intake and some hemoptysis. As the patient she had blood work done and was told to have her elevate calcium and worsened renal function. She presented to the ER and was found to have profound hyponatremia with a sodium of 105. Patient also had a marked  leukocytosis with a white count of 49,300 with a left shift, neutrophils 92%. Her serum CO2 was low at 17 and subsequent lactic acid post admission was 4.1. Patient admitted to step down for closer monitoring.   Hospital Course:  Severe hyponatremia  -likely secondary to dehydration further worsened with thiazide  -improving with hydration with NS. Na of 128 today and stable. She will be off HCTZ-triamterine.   Acute respiratory failure with hypoxia  Patient presented with / B/L upper lobe pneumonia  - treated with a 5 day course of levaquin -needs follow up CXR in 4 weeks for clearing.   Lung Nodule  -CT Chest- reveals small nodule in Left lung. Needs follow up CT chest in 6-12 mths as outpt.  -  Left Renal lesion  -incidental finding on renal US . Recommended MRI to better define the lesion. non contrast MRI done today shows b/l renal cyst with a complex left renal cyst. Cannot rule out renal amss due to lack of contrast a. recommend for a CT abd with and w/o contrast with renal mass protocol as outpatient once renal function stable .  Hypertension  BP elevated . increased dose of metoprolol to 50 mg bid and added low dose amlodipine 5 mg daily.  Dysphagia  -improved with rehydration and improvement in sodium level.  - tolerating diet at this time   Acute renal failure on CKD stage 4, GFR 15-29 ml/min  Renal function  improving with hydration and close to baseline. Needs outpt monitoring  Metabolic acidosis Secondary to dehydration. Bicarb still at 17. Continue Na bicarb tablets upon discharge and monitor outpatient.   Leukocytosis  -possibly due to dehydration, acute infection and recent steroids  -still at 30k but trending down. No clinical signs of  infection currently. Follow as outpt  Liver disease/? Cirrhosis  -had remote liver biopsy ?cirrhosis  -mild transaminitis which can be followed as outpt   Hypothyroidism  -On Synthroid  -TSH low possibly due to sick  euthyroid . Normal free T4. continue current dose 2D echo done for prominent systolic murmur on exam. Shows EF of 65-70% with grade 1 diastolic dysfunction.  Tobacco abuse  -quit < 1 month ago . Counseled on cessation   Patient clinically stale. Seen by PT and recommended HHPT.   Consultants:  Renal ( Dr Lorrene Reid)  procedures  None  Antibiotics:  Levaquin 4/7 >>> 4/11   Discharge Exam: Filed Vitals:   04/02/13 0515 04/02/13 1513 04/02/13 2146 04/03/13 0558  BP: 124/59 114/56 147/76 143/76  Pulse: 96 83 76 93  Temp: 98.6 F (37 C) 98.1 F (36.7 C) 98.6 F (37 C) 98.8 F (37.1 C)  TempSrc: Oral Oral Oral Oral  Resp: $Remo'18 18 20 20  'ikzhN$ Height:      Weight:      SpO2: 96% 98% 97% 94%    General: elderly female in NAD  HEENT: no pallor, moist oral mucosa  Cardiovascular: UY4&I3, 3/6 systolic murmur  Respiratory: clear b/l , no added sounds  Abdomen: soft, NT, ND, BS+  Musculoskeletal: warm, no edema  CNS: AAOX3    Discharge Instructions     Medication List    STOP taking these medications       azithromycin 250 MG tablet  Commonly known as:  ZITHROMAX     predniSONE 10 MG tablet  Commonly known as:  STERAPRED UNI-PAK     triamterene-hydrochlorothiazide 37.5-25 MG per tablet  Commonly known as:  MAXZIDE-25      TAKE these medications       albuterol 108 (90 BASE) MCG/ACT inhaler  Commonly known as:  PROVENTIL HFA;VENTOLIN HFA  Inhale 2 puffs into the lungs every 6 (six) hours as needed for wheezing.     amLODipine 5 MG tablet  Commonly known as:  NORVASC  Take 1 tablet (5 mg total) by mouth daily.     levothyroxine 88 MCG tablet  Commonly known as:  SYNTHROID, LEVOTHROID  Take 88 mcg by mouth daily before breakfast.     metoprolol tartrate 25 MG tablet  Commonly known as:  LOPRESSOR  Take 2 tablets (50 mg total) by mouth 2 (two) times daily.     senna-docusate 8.6-50 MG per tablet  Commonly known as:  Senokot-S  Take 1 tablet by mouth daily.      sodium bicarbonate 650 MG tablet  Take 1 tablet (650 mg total) by mouth 3 (three) times daily.           Follow-up Information   Follow up with Salena Saner., MD In 1 week. (need to check cbc and BMET. also need CT of the abd with contrast to evalaute renal mass on renal function stable. )    Contact information:   [place] [id number] [phone]        The results of significant diagnostics from this hospitalization (including imaging, microbiology, ancillary and laboratory) are listed below for reference.    Significant  Diagnostic Studies: Dg Chest 2 View  03/30/2013  *RADIOLOGY REPORT*  Clinical Data: Productive cough and shortness of breath; history of smoking.  CHEST - 2 VIEW  Comparison: None.  Findings: Right upper lobe and more mild left upper lobe airspace opacities are concerning for multifocal pneumonia.  No definite pleural effusion or pneumothorax is seen.  The appearance is atypical for mass.  The heart is normal in size.  No acute osseous abnormalities are seen.  There is interposition of the hepatic flexure of the colon superior to the liver.  IMPRESSION: Right upper lobe and more mild left upper lobe airspace opacities, concerning for multifocal pneumonia.   Original Report Authenticated By: Santa Lighter, M.D.    Ct Chest Wo Contrast  03/30/2013  *RADIOLOGY REPORT*  Clinical Data: Evaluate right upper lobe opacity seen on chest radiograph.  CT CHEST WITHOUT CONTRAST  Technique:  Multidetector CT imaging of the chest was performed following the standard protocol without IV contrast.  Comparison: Chest radiograph 03/30/2013 and 07/03/2012.  Findings: No pathologically enlarged mediastinal or axillary lymph nodes.  Hilar regions are difficult to definitively evaluate without IV contrast.  Coronary artery calcification.  Heart size normal.  No pericardial effusion.  There is thickening of the distal esophageal wall, which can be seen with  gastroesophageal reflux disease.  Esophagus is mildly dilated and air filled through at its course, indicative of dysmotility.  Tiny bilateral pleural effusions.  Emphysema.  Airspace consolidation is worst in the right upper lobe, with some involvement of the left upper lobe as well.  Nodular density in the medial left lower lobe measures 6 mm (image 26).  Adherent debris is seen in the trachea which is otherwise unremarkable.  Incidental imaging of the upper abdomen shows an extremely irregular liver margin.  No worrisome lytic or sclerotic lesions.  IMPRESSION:  1.  Patchy airspace consolidation in the upper lobes, right greater than left, most consistent with pneumonia. Tiny bilateral pleural effusions.  Follow-up to clearing could be performed, as clinically indicated. 2.  6 mm nodular density in the medial left lower lobe. Given risk factors for bronchogenic carcinoma, follow-up chest CT at 6 - 12 months is recommended.  This recommendation follows the consensus statement: Guidelines for Management of Small Pulmonary Nodules Detected on CT Scans: A Statement from the [place] as published in Radiology 2005; 237:395-400. 3.  Coronary artery calcification. 4.  Cirrhosis.   Original Report Authenticated By: Lorin Picket, M.D.    Mr Abdomen Wo Contrast  04/03/2013  *RADIOLOGY REPORT*  Clinical Data: Evaluate left renal lesion.  Chronic kidney disease.  MRI ABDOMEN WITHOUT CONTRAST  Technique:  Multiplanar multisequence MR imaging of the abdomen was performed. No intravenous contrast was administered.  Comparison: Ultrasound 03/31/2013.  Findings: The exam is moderate to markedly degraded by motion and lack of intravenous contrast.  Possible small bilateral pleural effusions.  Markedly irregular appearance of the liver, likely representing cirrhosis.  No dominant mass seen.  Normal spleen, stomach, pancreas, gallbladder, biliary tract, adrenal glands.  Mild renal cortical thinning bilaterally.  Bilateral small renal cysts.  Anterior upper pole left renal lesion which measures 1.2 cm and is T2 hypointense on image 13/series 5. More posterior upper/interpolar 1.3 cm T2 hypointense lesion on image 16/series 5.  Interpolar left renal lesion which measures 7 mm laterally and demonstrates T2 hypointensity on image 18/series 5.  These lesions are not well evaluated on T1-weighted imaging.  No gross abdominal adenopathy.  Small volume abdominal ascites.  IMPRESSION:  1.  Moderate to markedly degraded exam secondary to motion and lack of IV contrast. 2.  Bilateral renal cysts with multiple left renal lesions which demonstrate complexity, as evidenced by T2 hypointensity. These could represent hemorrhagic / proteinaceous cysts.  One or more papillary type renal cell carcinomas cannot be entirely excluded, given lack of IV contrast.  The patient's creatinine level is currently 1.2, with a GFR of 54.  When the patient is able to follow directions and breath-hold, dedicated pre and postcontrast CT using a renal mass protocol should be considered. 3.  Small volume ascites with markedly irregular hepatic contour, most consistent with cirrhosis.   Original Report Authenticated By: Abigail Miyamoto, M.D.    US Renal  03/31/2013  *RADIOLOGY REPORT*  Clinical Data: Chronic kidney disease.  RENAL/URINARY TRACT ULTRASOUND COMPLETE  Comparison:  No priors.  Findings:  Right Kidney:  Diffusely echogenic.  Multiple small lesions which appear anechoic with increased through transmission, largest of which measures only 1.1 x 0.9 x 0.9 cm, likely represent small cysts.  No hydronephrosis.  10.8 cm in length.  Left Kidney:  9.5 cm in length.  Diffusely echogenic.  Multiple lesions, the majority of which appeared represents small simple cyst.  However, in the lower pole there is an area of complex echogenicity measuring approximately 1.5 x 0.9 x 1.3 cm.  This lesion is indeterminate.  No hydronephrosis.   A small amount of surrounding  perinephric fluid.  Bladder:  Well distended with an irregular contour of the anterolateral aspect of the urinary bladder on the right side, suspicious for a urinary bladder diverticulum.  IMPRESSION: 1. Indeterminate 1.5 x 0.9 x 1.3 cm lesion in the lower pole of the left kidney.  The possibility of a neoplasm is not excluded, although this may simply represent a complex cyst.  Further characterization with MRI with and without gadolinium is suggested to exclude malignancy. 2.  Echogenic renal parenchyma bilaterally, suggesting underlying medical renal disease. 3.  Small amount of perinephric fluid around the left kidney. 4.  Probable bladder wall diverticulum, as above.   Original Report Authenticated By: Vinnie Langton, M.D.     Microbiology: Recent Results (from the past 240 hour(s))  MRSA PCR SCREENING     Status: None   Collection Time    03/30/13  6:38 AM      Result Value Range Status   MRSA by PCR NEGATIVE  NEGATIVE Final   Comment:            The GeneXpert MRSA Assay (FDA     approved for NASAL specimens     only), is one component of a     comprehensive MRSA colonization     surveillance program. It is not     intended to diagnose MRSA     infection nor to guide or     monitor treatment for     MRSA infections.  CULTURE, BLOOD (ROUTINE X 2)     Status: None   Collection Time    03/30/13  7:04 AM      Result Value Range Status   Specimen Description BLOOD FOREARM LEFT   Final   Special Requests BOTTLES DRAWN AEROBIC ONLY 8CC   Final   Culture  Setup Time 03/30/2013 12:47   Final   Culture     Final   Value:        BLOOD CULTURE RECEIVED NO GROWTH TO DATE CULTURE WILL BE HELD FOR 5 DAYS BEFORE ISSUING A FINAL NEGATIVE  REPORT   Report Status PENDING   Incomplete  CULTURE, BLOOD (ROUTINE X 2)     Status: None   Collection Time    03/30/13  7:20 AM      Result Value Range Status   Specimen Description BLOOD HAND LEFT   Final   Special Requests BOTTLES DRAWN AEROBIC ONLY 5CC    Final   Culture  Setup Time 03/30/2013 12:47   Final   Culture     Final   Value:        BLOOD CULTURE RECEIVED NO GROWTH TO DATE CULTURE WILL BE HELD FOR 5 DAYS BEFORE ISSUING A FINAL NEGATIVE REPORT   Report Status PENDING   Incomplete     Labs: Basic Metabolic Panel:  Recent Labs Lab 03/31/13 1135 03/31/13 1341  03/31/13 2202 04/01/13 0340 04/01/13 1205 04/02/13 0435 04/03/13 0430  NA 125*  --   < > 124* 129* 128* 128* 128*  K 6.0*  --   < > 4.8 4.9 4.6 4.6 4.7  CL 98  --   < > 96 101 101 103 102  CO2 16*  --   < > 18* 18* 18* 19 17*  GLUCOSE 111*  --   < > 95 82 86 78 80  BUN 40*  --   < > 38* 37* 35* 28* 20  CREATININE 1.58*  --   < > 1.52* 1.45* 1.42* 1.36* 1.17*  CALCIUM 9.4  --   < > 9.5 9.2 9.0 8.9 8.9  MG  --  2.5  --   --   --   --   --   --   < > = values in this interval not displayed. Liver Function Tests:  Recent Labs Lab 03/30/13 0146 03/30/13 0319  AST 94* 77*  ALT 75* 64*  ALKPHOS 263* 218*  BILITOT 0.8 0.8  PROT 7.3 6.3  ALBUMIN 3.0* 2.5*   No results found for this basename: LIPASE, AMYLASE,  in the last 168 hours No results found for this basename: AMMONIA,  in the last 168 hours CBC:  Recent Labs Lab 03/30/13 0146 03/30/13 0319 03/30/13 0732 03/31/13 0520 04/01/13 0340 04/03/13 0430  WBC 49.3* 48.4* 58.9* 45.6* 28.2* 30.0*  NEUTROABS 45.3* 46.0* 55.9*  --   --   --   HGB 8.9* 10.5* 11.0* 11.4* 10.5* 11.2*  HCT 23.9* 28.0* 31.7* 30.3* 28.5* 30.3*  MCV 92.3 92.1 91.2 89.9 90.5 91.8  PLT 150 139* 171 194 177 194   Cardiac Enzymes:  Recent Labs Lab 03/30/13 0146  TROPONINI <0.30   BNP: BNP (last 3 results) No results found for this basename: PROBNP,  in the last 8760 hours CBG: No results found for this basename: GLUCAP,  in the last 168 hours     Signed:  Ranell Finelli  [facility] 04/03/2013, 1:49 PM

## 2013-04-03 NOTE — Progress Notes (Signed)
Physical Therapy Treatment Patient Details Name: [name] MRN: [id number] DOB: [birth year] Today's Date: 04/03/2013 Time: 5790-3833 PT Time Calculation (min): 24 min  PT Assessment / Plan / Recommendation Comments on Treatment Session  Pt progressing well and is mod I wiht RW, supervision with stair negotiation.  Pt continues to require use of RW at this time due to min A needed for balance without AD.  Pt/family educated and express understanding of need for RW at all times at home for safety.    Follow Up Recommendations  Home health PT;Supervision for mobility/OOB     Does the patient have the potential to tolerate intense rehabilitation     Barriers to Discharge        Equipment Recommendations  None recommended by PT    Recommendations for Other Services    Frequency Min 3X/week   Plan Discharge plan remains appropriate    Precautions / Restrictions     Pertinent Vitals/Pain No c/o pain    Mobility  Bed Mobility Supine to Sit: 7: Independent Sit to Supine: 7: Independent Transfers Sit to Stand: 6: Modified independent (Device/Increase time) Stand to Sit: 6: Modified independent (Device/Increase time) Details for Transfer Assistance: with RW Ambulation/Gait Ambulation/Gait Assistance: 6: Modified independent (Device/Increase time) Ambulation Distance (Feet): 200 Feet Assistive device: Rolling walker Ambulation/Gait Assistance Details: no LOB, able to gait without rest 200' Stairs: Yes Stairs Assistance: 5: Supervision Stair Management Technique: One rail Right Number of Stairs: 3    Exercises     PT Diagnosis:    PT Problem List:   PT Treatment Interventions:     PT Goals Acute Rehab PT Goals PT Goal: Supine/Side to Sit - Progress: Met PT Goal: Sit to Stand - Progress: Met PT Goal: Stand to Sit - Progress: Met PT Goal: Stand - Progress: Progressing toward goal PT Goal: Ambulate - Progress: Met PT Goal: Up/Down Stairs - Progress: Met  Visit  Information  Last PT Received On: 04/03/13 Assistance Needed: +1    Subjective Data  Subjective: I feel good   Cognition  Cognition Overall Cognitive Status: Appears within functional limits for tasks assessed/performed    Balance  High Level Balance High Level Balance Comments: dynamic balance training without AD with turning and stepping with close supevision-min A, pt/family educated on need to use RW at home at this point and recommendation for continued PT to improve balance and activity tolerance  End of Session PT - End of Session Activity Tolerance: Patient tolerated treatment well Patient left: in bed;with call bell/phone within reach;with family/visitor present   GP     [name] 04/03/2013, 11:03 AM

## 2013-04-03 NOTE — Progress Notes (Signed)
  Echocardiogram 2D Echocardiogram has been performed.  Ardelle Balls A 04/03/2013, 9:54 AM

## 2013-04-05 LAB — CULTURE, BLOOD (ROUTINE X 2): Culture: NO GROWTH

## 2013-12-04 ENCOUNTER — Other Ambulatory Visit: Payer: Self-pay | Admitting: Nephrology

## 2013-12-04 DIAGNOSIS — I120 Hypertensive chronic kidney disease with stage 5 chronic kidney disease or end stage renal disease: Secondary | ICD-10-CM

## 2013-12-11 ENCOUNTER — Ambulatory Visit
Admission: RE | Admit: 2013-12-11 | Discharge: 2013-12-11 | Disposition: A | Discharge status: Home / Self Care | Payer: Medicare Other | Source: Ambulatory Visit | Attending: Nephrology | Admitting: Nephrology

## 2013-12-11 DIAGNOSIS — I120 Hypertensive chronic kidney disease with stage 5 chronic kidney disease or end stage renal disease: Secondary | ICD-10-CM

## 2015-02-15 DIAGNOSIS — D631 Anemia in chronic kidney disease: Secondary | ICD-10-CM | POA: Diagnosis not present

## 2015-04-19 DIAGNOSIS — D631 Anemia in chronic kidney disease: Secondary | ICD-10-CM | POA: Diagnosis not present

## 2015-04-19 DIAGNOSIS — N189 Chronic kidney disease, unspecified: Secondary | ICD-10-CM | POA: Diagnosis not present

## 2015-04-19 DIAGNOSIS — N2581 Secondary hyperparathyroidism of renal origin: Secondary | ICD-10-CM | POA: Diagnosis not present

## 2015-04-19 DIAGNOSIS — N179 Acute kidney failure, unspecified: Secondary | ICD-10-CM | POA: Diagnosis not present

## 2015-04-29 DIAGNOSIS — Z79899 Other long term (current) drug therapy: Secondary | ICD-10-CM | POA: Diagnosis not present

## 2015-04-29 DIAGNOSIS — I129 Hypertensive chronic kidney disease with stage 1 through stage 4 chronic kidney disease, or unspecified chronic kidney disease: Secondary | ICD-10-CM | POA: Diagnosis not present

## 2015-11-02 DIAGNOSIS — E039 Hypothyroidism, unspecified: Secondary | ICD-10-CM | POA: Diagnosis not present

## 2015-11-02 DIAGNOSIS — N184 Chronic kidney disease, stage 4 (severe): Secondary | ICD-10-CM | POA: Diagnosis not present

## 2015-11-02 DIAGNOSIS — R05 Cough: Secondary | ICD-10-CM | POA: Diagnosis not present

## 2015-11-02 DIAGNOSIS — Z23 Encounter for immunization: Secondary | ICD-10-CM | POA: Diagnosis not present

## 2015-11-02 DIAGNOSIS — I129 Hypertensive chronic kidney disease with stage 1 through stage 4 chronic kidney disease, or unspecified chronic kidney disease: Secondary | ICD-10-CM | POA: Diagnosis not present

## 2015-12-28 DIAGNOSIS — H903 Sensorineural hearing loss, bilateral: Secondary | ICD-10-CM | POA: Diagnosis not present

## 2015-12-28 DIAGNOSIS — Z9621 Cochlear implant status: Secondary | ICD-10-CM | POA: Diagnosis not present

## 2016-03-01 DIAGNOSIS — E039 Hypothyroidism, unspecified: Secondary | ICD-10-CM | POA: Diagnosis not present

## 2016-03-01 DIAGNOSIS — I129 Hypertensive chronic kidney disease with stage 1 through stage 4 chronic kidney disease, or unspecified chronic kidney disease: Secondary | ICD-10-CM | POA: Diagnosis not present

## 2016-03-01 DIAGNOSIS — E038 Other specified hypothyroidism: Secondary | ICD-10-CM | POA: Diagnosis not present

## 2016-03-01 DIAGNOSIS — N184 Chronic kidney disease, stage 4 (severe): Secondary | ICD-10-CM | POA: Diagnosis not present

## 2016-03-06 DIAGNOSIS — N2581 Secondary hyperparathyroidism of renal origin: Secondary | ICD-10-CM | POA: Diagnosis not present

## 2016-03-06 DIAGNOSIS — N179 Acute kidney failure, unspecified: Secondary | ICD-10-CM | POA: Diagnosis not present

## 2016-03-06 DIAGNOSIS — N189 Chronic kidney disease, unspecified: Secondary | ICD-10-CM | POA: Diagnosis not present

## 2016-03-06 DIAGNOSIS — D631 Anemia in chronic kidney disease: Secondary | ICD-10-CM | POA: Diagnosis not present

## 2016-06-27 DIAGNOSIS — H903 Sensorineural hearing loss, bilateral: Secondary | ICD-10-CM | POA: Diagnosis not present

## 2016-06-27 DIAGNOSIS — Z9621 Cochlear implant status: Secondary | ICD-10-CM | POA: Diagnosis not present

## 2016-09-11 DIAGNOSIS — N189 Chronic kidney disease, unspecified: Secondary | ICD-10-CM | POA: Diagnosis not present

## 2016-09-11 DIAGNOSIS — D631 Anemia in chronic kidney disease: Secondary | ICD-10-CM | POA: Diagnosis not present

## 2016-09-11 DIAGNOSIS — N179 Acute kidney failure, unspecified: Secondary | ICD-10-CM | POA: Diagnosis not present

## 2016-09-11 DIAGNOSIS — N2581 Secondary hyperparathyroidism of renal origin: Secondary | ICD-10-CM | POA: Diagnosis not present

## 2016-10-03 DIAGNOSIS — E039 Hypothyroidism, unspecified: Secondary | ICD-10-CM | POA: Diagnosis not present

## 2016-10-03 DIAGNOSIS — Z79899 Other long term (current) drug therapy: Secondary | ICD-10-CM | POA: Diagnosis not present

## 2016-10-03 DIAGNOSIS — I129 Hypertensive chronic kidney disease with stage 1 through stage 4 chronic kidney disease, or unspecified chronic kidney disease: Secondary | ICD-10-CM | POA: Diagnosis not present

## 2016-10-03 DIAGNOSIS — N184 Chronic kidney disease, stage 4 (severe): Secondary | ICD-10-CM | POA: Diagnosis not present

## 2017-03-07 DIAGNOSIS — I129 Hypertensive chronic kidney disease with stage 1 through stage 4 chronic kidney disease, or unspecified chronic kidney disease: Secondary | ICD-10-CM | POA: Diagnosis not present

## 2017-03-07 DIAGNOSIS — E559 Vitamin D deficiency, unspecified: Secondary | ICD-10-CM | POA: Diagnosis not present

## 2017-03-07 DIAGNOSIS — E039 Hypothyroidism, unspecified: Secondary | ICD-10-CM | POA: Diagnosis not present

## 2017-03-07 DIAGNOSIS — N184 Chronic kidney disease, stage 4 (severe): Secondary | ICD-10-CM | POA: Diagnosis not present

## 2017-03-07 DIAGNOSIS — Z0000 Encounter for general adult medical examination without abnormal findings: Secondary | ICD-10-CM | POA: Diagnosis not present

## 2017-03-07 DIAGNOSIS — H9192 Unspecified hearing loss, left ear: Secondary | ICD-10-CM | POA: Diagnosis not present

## 2017-03-07 DIAGNOSIS — I10 Essential (primary) hypertension: Secondary | ICD-10-CM | POA: Diagnosis not present

## 2017-05-29 DIAGNOSIS — I129 Hypertensive chronic kidney disease with stage 1 through stage 4 chronic kidney disease, or unspecified chronic kidney disease: Secondary | ICD-10-CM | POA: Diagnosis not present

## 2017-05-29 DIAGNOSIS — E039 Hypothyroidism, unspecified: Secondary | ICD-10-CM | POA: Diagnosis not present

## 2017-05-29 DIAGNOSIS — H9193 Unspecified hearing loss, bilateral: Secondary | ICD-10-CM | POA: Diagnosis not present

## 2017-05-29 DIAGNOSIS — N184 Chronic kidney disease, stage 4 (severe): Secondary | ICD-10-CM | POA: Diagnosis not present

## 2017-06-27 DIAGNOSIS — Z45321 Encounter for adjustment and management of cochlear device: Secondary | ICD-10-CM | POA: Diagnosis not present

## 2017-08-01 DIAGNOSIS — H903 Sensorineural hearing loss, bilateral: Secondary | ICD-10-CM | POA: Diagnosis not present

## 2017-08-12 DIAGNOSIS — H903 Sensorineural hearing loss, bilateral: Secondary | ICD-10-CM | POA: Diagnosis not present

## 2017-08-12 DIAGNOSIS — Z87891 Personal history of nicotine dependence: Secondary | ICD-10-CM | POA: Diagnosis not present

## 2017-08-12 DIAGNOSIS — L89899 Pressure ulcer of other site, unspecified stage: Secondary | ICD-10-CM | POA: Diagnosis not present

## 2017-08-12 DIAGNOSIS — Z9621 Cochlear implant status: Secondary | ICD-10-CM | POA: Diagnosis not present

## 2017-08-29 DIAGNOSIS — E039 Hypothyroidism, unspecified: Secondary | ICD-10-CM | POA: Diagnosis not present

## 2017-08-29 DIAGNOSIS — Z634 Disappearance and death of family member: Secondary | ICD-10-CM | POA: Diagnosis not present

## 2017-08-29 DIAGNOSIS — N184 Chronic kidney disease, stage 4 (severe): Secondary | ICD-10-CM | POA: Diagnosis not present

## 2017-08-29 DIAGNOSIS — I129 Hypertensive chronic kidney disease with stage 1 through stage 4 chronic kidney disease, or unspecified chronic kidney disease: Secondary | ICD-10-CM | POA: Diagnosis not present

## 2017-11-28 DIAGNOSIS — I129 Hypertensive chronic kidney disease with stage 1 through stage 4 chronic kidney disease, or unspecified chronic kidney disease: Secondary | ICD-10-CM | POA: Diagnosis not present

## 2017-11-28 DIAGNOSIS — H9190 Unspecified hearing loss, unspecified ear: Secondary | ICD-10-CM | POA: Diagnosis not present

## 2017-11-28 DIAGNOSIS — E039 Hypothyroidism, unspecified: Secondary | ICD-10-CM | POA: Diagnosis not present

## 2017-11-28 DIAGNOSIS — R001 Bradycardia, unspecified: Secondary | ICD-10-CM | POA: Diagnosis not present

## 2018-01-09 DIAGNOSIS — E039 Hypothyroidism, unspecified: Secondary | ICD-10-CM | POA: Diagnosis not present

## 2018-01-22 ENCOUNTER — Inpatient Hospital Stay (HOSPITAL_COMMUNITY): Payer: Medicare Other

## 2018-01-22 ENCOUNTER — Inpatient Hospital Stay (HOSPITAL_COMMUNITY)
Admission: EM | Admit: 2018-01-22 | Discharge: 2018-01-25 | DRG: 682 | Disposition: A | Discharge status: Home / Self Care | Payer: Medicare Other | Attending: Internal Medicine | Admitting: Internal Medicine

## 2018-01-22 ENCOUNTER — Emergency Department (HOSPITAL_COMMUNITY): Payer: Medicare Other

## 2018-01-22 ENCOUNTER — Encounter (HOSPITAL_COMMUNITY): Payer: Self-pay | Admitting: Emergency Medicine

## 2018-01-22 ENCOUNTER — Other Ambulatory Visit: Payer: Self-pay

## 2018-01-22 DIAGNOSIS — Z681 Body mass index (BMI) 19 or less, adult: Secondary | ICD-10-CM

## 2018-01-22 DIAGNOSIS — I129 Hypertensive chronic kidney disease with stage 1 through stage 4 chronic kidney disease, or unspecified chronic kidney disease: Secondary | ICD-10-CM | POA: Diagnosis not present

## 2018-01-22 DIAGNOSIS — N184 Chronic kidney disease, stage 4 (severe): Secondary | ICD-10-CM | POA: Diagnosis not present

## 2018-01-22 DIAGNOSIS — Z9621 Cochlear implant status: Secondary | ICD-10-CM | POA: Diagnosis not present

## 2018-01-22 DIAGNOSIS — E872 Acidosis: Secondary | ICD-10-CM | POA: Diagnosis present

## 2018-01-22 DIAGNOSIS — R109 Unspecified abdominal pain: Secondary | ICD-10-CM | POA: Diagnosis not present

## 2018-01-22 DIAGNOSIS — R1110 Vomiting, unspecified: Secondary | ICD-10-CM | POA: Diagnosis not present

## 2018-01-22 DIAGNOSIS — B9620 Unspecified Escherichia coli [E. coli] as the cause of diseases classified elsewhere: Secondary | ICD-10-CM | POA: Diagnosis not present

## 2018-01-22 DIAGNOSIS — F17200 Nicotine dependence, unspecified, uncomplicated: Secondary | ICD-10-CM | POA: Diagnosis present

## 2018-01-22 DIAGNOSIS — Z79890 Hormone replacement therapy (postmenopausal): Secondary | ICD-10-CM | POA: Diagnosis not present

## 2018-01-22 DIAGNOSIS — N183 Chronic kidney disease, stage 3 (moderate): Secondary | ICD-10-CM | POA: Diagnosis present

## 2018-01-22 DIAGNOSIS — E43 Unspecified severe protein-calorie malnutrition: Secondary | ICD-10-CM | POA: Diagnosis not present

## 2018-01-22 DIAGNOSIS — B349 Viral infection, unspecified: Secondary | ICD-10-CM | POA: Diagnosis present

## 2018-01-22 DIAGNOSIS — R197 Diarrhea, unspecified: Secondary | ICD-10-CM | POA: Diagnosis not present

## 2018-01-22 DIAGNOSIS — R9431 Abnormal electrocardiogram [ECG] [EKG]: Secondary | ICD-10-CM | POA: Diagnosis not present

## 2018-01-22 DIAGNOSIS — R410 Disorientation, unspecified: Secondary | ICD-10-CM

## 2018-01-22 DIAGNOSIS — E876 Hypokalemia: Secondary | ICD-10-CM | POA: Diagnosis not present

## 2018-01-22 DIAGNOSIS — E860 Dehydration: Secondary | ICD-10-CM | POA: Diagnosis not present

## 2018-01-22 DIAGNOSIS — E039 Hypothyroidism, unspecified: Secondary | ICD-10-CM | POA: Diagnosis not present

## 2018-01-22 DIAGNOSIS — I471 Supraventricular tachycardia: Secondary | ICD-10-CM | POA: Diagnosis not present

## 2018-01-22 DIAGNOSIS — R531 Weakness: Secondary | ICD-10-CM | POA: Diagnosis not present

## 2018-01-22 DIAGNOSIS — R110 Nausea: Secondary | ICD-10-CM | POA: Diagnosis not present

## 2018-01-22 DIAGNOSIS — N3000 Acute cystitis without hematuria: Secondary | ICD-10-CM | POA: Diagnosis not present

## 2018-01-22 DIAGNOSIS — N179 Acute kidney failure, unspecified: Secondary | ICD-10-CM | POA: Diagnosis not present

## 2018-01-22 DIAGNOSIS — N390 Urinary tract infection, site not specified: Secondary | ICD-10-CM | POA: Diagnosis present

## 2018-01-22 DIAGNOSIS — I10 Essential (primary) hypertension: Secondary | ICD-10-CM | POA: Diagnosis not present

## 2018-01-22 DIAGNOSIS — R634 Abnormal weight loss: Secondary | ICD-10-CM | POA: Diagnosis present

## 2018-01-22 HISTORY — DX: Essential (primary) hypertension: I10

## 2018-01-22 LAB — TSH: TSH: 0.044 u[IU]/mL — AB (ref 0.350–4.500)

## 2018-01-22 LAB — CBC
HCT: 36.4 % (ref 36.0–46.0)
HEMOGLOBIN: 12 g/dL (ref 12.0–15.0)
MCH: 37 pg — AB (ref 26.0–34.0)
MCHC: 33 g/dL (ref 30.0–36.0)
MCV: 112.3 fL — AB (ref 78.0–100.0)
Platelets: 220 10*3/uL (ref 150–400)
RBC: 3.24 MIL/uL — ABNORMAL LOW (ref 3.87–5.11)
RDW: 15.8 % — ABNORMAL HIGH (ref 11.5–15.5)
WBC: 11 10*3/uL — ABNORMAL HIGH (ref 4.0–10.5)

## 2018-01-22 LAB — URINALYSIS, ROUTINE W REFLEX MICROSCOPIC
BILIRUBIN URINE: NEGATIVE
Glucose, UA: NEGATIVE mg/dL
KETONES UR: NEGATIVE mg/dL
Nitrite: POSITIVE — AB
PH: 5 (ref 5.0–8.0)
PROTEIN: 30 mg/dL — AB
Specific Gravity, Urine: 1.02 (ref 1.005–1.030)

## 2018-01-22 LAB — HEPATIC FUNCTION PANEL
ALT: 18 U/L (ref 14–54)
AST: 26 U/L (ref 15–41)
Albumin: 4.5 g/dL (ref 3.5–5.0)
Alkaline Phosphatase: 86 U/L (ref 38–126)
BILIRUBIN TOTAL: 0.3 mg/dL (ref 0.3–1.2)
Total Protein: 7.7 g/dL (ref 6.5–8.1)

## 2018-01-22 LAB — BASIC METABOLIC PANEL
ANION GAP: 12 (ref 5–15)
BUN: 69 mg/dL — ABNORMAL HIGH (ref 6–20)
CHLORIDE: 114 mmol/L — AB (ref 101–111)
CO2: 9 mmol/L — AB (ref 22–32)
Calcium: 9.4 mg/dL (ref 8.9–10.3)
Creatinine, Ser: 4.86 mg/dL — ABNORMAL HIGH (ref 0.44–1.00)
GFR calc Af Amer: 9 mL/min — ABNORMAL LOW (ref >60)
GFR calc non Af Amer: 8 mL/min — ABNORMAL LOW (ref >60)
GLUCOSE: 106 mg/dL — AB (ref 65–99)
Potassium: 4.2 mmol/L (ref 3.5–5.1)
Sodium: 135 mmol/L (ref 135–145)

## 2018-01-22 LAB — LIPASE, BLOOD: Lipase: 40 U/L (ref 11–51)

## 2018-01-22 LAB — PROCALCITONIN: Procalcitonin: >150 ng/mL

## 2018-01-22 LAB — CBG MONITORING, ED: GLUCOSE-CAPILLARY: 72 mg/dL (ref 65–99)

## 2018-01-22 LAB — I-STAT CG4 LACTIC ACID, ED: Lactic Acid, Venous: 1.9 mmol/L (ref 0.5–1.9)

## 2018-01-22 MED ORDER — LEVOTHYROXINE SODIUM 100 MCG PO TABS: 100.0000 ug | ORAL_TABLET | Freq: Every day | ORAL | Status: DC

## 2018-01-22 MED ORDER — SODIUM CHLORIDE 0.9 % IV SOLN: 250.0000 mL | INTRAVENOUS | Status: DC | PRN

## 2018-01-22 MED ORDER — ONDANSETRON HCL 4 MG PO TABS
4.0000 mg | ORAL_TABLET | Freq: Four times a day (QID) | ORAL | Status: DC | PRN
  Filled 2018-01-22: qty 1

## 2018-01-22 MED ORDER — HEPARIN SODIUM (PORCINE) 5000 UNIT/ML IJ SOLN
5000.0000 [IU] | Freq: Three times a day (TID) | INTRAMUSCULAR | Status: DC
  Administered 2018-01-22 – 2018-01-25 (×8): 5000 [IU] via SUBCUTANEOUS
  Filled 2018-01-22 (×8): qty 1

## 2018-01-22 MED ORDER — SENNOSIDES-DOCUSATE SODIUM 8.6-50 MG PO TABS
1.0000 | ORAL_TABLET | Freq: Every day | ORAL | Status: DC
  Administered 2018-01-25: 1 via ORAL
  Filled 2018-01-22 (×2): qty 1

## 2018-01-22 MED ORDER — SODIUM BICARBONATE 650 MG PO TABS
650.0000 mg | ORAL_TABLET | Freq: Three times a day (TID) | ORAL | Status: DC
  Administered 2018-01-22 – 2018-01-23 (×2): 650 mg via ORAL
  Filled 2018-01-22 (×2): qty 1

## 2018-01-22 MED ORDER — LACTATED RINGERS IV BOLUS (SEPSIS)
1000.0000 mL | Freq: Once | INTRAVENOUS | Status: AC
Start: 2018-01-22 — End: 2018-01-22
  Administered 2018-01-22: 1000 mL via INTRAVENOUS

## 2018-01-22 MED ORDER — LEVALBUTEROL HCL 0.63 MG/3ML IN NEBU: 0.6300 mg | INHALATION_SOLUTION | Freq: Four times a day (QID) | RESPIRATORY_TRACT | Status: DC | PRN

## 2018-01-22 MED ORDER — AMLODIPINE BESYLATE 5 MG PO TABS
5.0000 mg | ORAL_TABLET | Freq: Every day | ORAL | Status: DC
  Filled 2018-01-22: qty 1

## 2018-01-22 MED ORDER — ONDANSETRON HCL 4 MG/2ML IJ SOLN
4.0000 mg | Freq: Four times a day (QID) | INTRAMUSCULAR | Status: DC | PRN
  Administered 2018-01-23 – 2018-01-24 (×2): 4 mg via INTRAVENOUS
  Filled 2018-01-22 (×2): qty 2

## 2018-01-22 MED ORDER — SODIUM CHLORIDE 0.9% FLUSH: 3.0000 mL | INTRAVENOUS | Status: DC | PRN

## 2018-01-22 MED ORDER — LACTATED RINGERS IV BOLUS (SEPSIS)
1000.0000 mL | Freq: Once | INTRAVENOUS | Status: AC
  Administered 2018-01-22: 1000 mL via INTRAVENOUS

## 2018-01-22 MED ORDER — DEXTROSE 5 % IV SOLN
1.0000 g | INTRAVENOUS | Status: AC
  Administered 2018-01-23 – 2018-01-24 (×2): 1 g via INTRAVENOUS
  Filled 2018-01-22 (×2): qty 10

## 2018-01-22 MED ORDER — ACETAMINOPHEN 650 MG RE SUPP: 650.0000 mg | Freq: Four times a day (QID) | RECTAL | Status: DC | PRN

## 2018-01-22 MED ORDER — ACETAMINOPHEN 325 MG PO TABS
650.0000 mg | ORAL_TABLET | Freq: Once | ORAL | Status: AC
  Administered 2018-01-22: 650 mg via ORAL
  Filled 2018-01-22: qty 2

## 2018-01-22 MED ORDER — SODIUM CHLORIDE 0.9% FLUSH
3.0000 mL | Freq: Two times a day (BID) | INTRAVENOUS | Status: DC
  Administered 2018-01-22 – 2018-01-23 (×2): 3 mL via INTRAVENOUS

## 2018-01-22 MED ORDER — ACETAMINOPHEN 325 MG PO TABS: 650.0000 mg | ORAL_TABLET | Freq: Four times a day (QID) | ORAL | Status: DC | PRN

## 2018-01-22 MED ORDER — LEVALBUTEROL HCL 0.63 MG/3ML IN NEBU: 0.6300 mg | INHALATION_SOLUTION | Freq: Four times a day (QID) | RESPIRATORY_TRACT | Status: DC

## 2018-01-22 MED ORDER — DEXTROSE 5 % IV SOLN
1.0000 g | Freq: Once | INTRAVENOUS | Status: AC
  Administered 2018-01-22: 1 g via INTRAVENOUS
  Filled 2018-01-22: qty 10

## 2018-01-22 MED ORDER — METOPROLOL TARTRATE 25 MG PO TABS
12.5000 mg | ORAL_TABLET | Freq: Two times a day (BID) | ORAL | Status: DC
  Administered 2018-01-22: 12.5 mg via ORAL
  Filled 2018-01-22 (×2): qty 1

## 2018-01-22 NOTE — ED Triage Notes (Signed)
Pt states she feels weak and feels like she is not in focus  Family states she has noticed the pt has been confused for the past couple of days  Pt reports loose stools for the past two days  States she has been this way before when her sodium level was low  Family reports the pt has not been eating well

## 2018-01-22 NOTE — Progress Notes (Signed)
Patient was transferred from ED at 1615; alert and oriented x3; no pain complain; vital signs was taken. Family member at bedside. Will continue on monitoring patient.

## 2018-01-22 NOTE — ED Notes (Signed)
Bed: WA08 Expected date:  Expected time:  Means of arrival:  Comments: 

## 2018-01-22 NOTE — ED Provider Notes (Signed)
[facility] DEPT Provider Note   CSN: [id number] Arrival date & time: 01/22/18  7619     History   Chief Complaint Chief Complaint  Patient presents with  . Weakness    HPI [name] is a 72 y.o. female.  HPI Patient presents with 2 family members who assist with the HPI. She presents due to weakness, nausea, diarrhea, confusion. Onset seems to have been about 4 days ago, and since onset symptoms been progressive. Patient was generally well prior to the onset of symptoms, with only recent decrease in dosing of her thyroid medication, as a notable change from baseline medication, diet, activity status. She and family notes that over the past couple days the patient has used the bathroom seemingly at least 7 times daily, with loose stool. There is concurrent diminished intake. Patient himself denies focal pain, however. During this illness the patient's confusion has been more noticeable. There is associated weakness, but no syncope, no fall. Patient has one prior similar event, with diagnosis of hyponatremia.  Past Medical History:  Diagnosis Date  . Hypertension   . Liver disease   . Renal disorder   . Thyroid disease     Patient Active Problem List   Diagnosis Date Noted  . Left renal mass 04/03/2013  . Hyponatremia-severe 03/30/2013  . Acute renal failure ([place]) 03/30/2013  . Right upper lobe consolidation ([place]) 03/30/2013  . Cough with hemoptysis 03/30/2013  . Tobacco abuse 03/30/2013  . Weight loss 03/30/2013  . Thiazide diuretics causing adverse effect in therapeutic use 03/30/2013  . Leukocytosis 03/30/2013  . Acute respiratory failure with hypoxia ([place]) 03/30/2013  . Dehydration 03/30/2013  . Metabolic acidosis 50/93/2671  . CKD (chronic kidney disease) stage 4, GFR 15-29 ml/min (HCC)   . Liver disease/? cirrhosis   . Hypothyroidism     Past Surgical History:  Procedure Laterality Date  . COCHLEAR IMPLANT    . KNEE  SURGERY      OB History    No data available       Home Medications    Prior to Admission medications   Medication Sig Start Date End Date Taking? Authorizing Provider  acetaminophen (TYLENOL 8 HOUR ARTHRITIS PAIN) 650 MG CR tablet Take 1,950 mg by mouth daily as needed for pain (ARTHRITIS).   Yes [provider]  hydrochlorothiazide (HYDRODIURIL) 12.5 MG tablet Take 12.5 mg by mouth daily. 12/28/17  Yes [provider]  levothyroxine (SYNTHROID, LEVOTHROID) 100 MCG tablet Take 100 mcg by mouth daily. 12/28/17  Yes [provider]  metoprolol tartrate (LOPRESSOR) 25 MG tablet Take 2 tablets (50 mg total) by mouth 2 (two) times daily. 04/03/13  Yes Dhungel, Nishant, MD  amLODipine (NORVASC) 5 MG tablet Take 1 tablet (5 mg total) by mouth daily. Patient not taking: Reported on 01/22/2018 04/03/13   Dhungel, Flonnie Overman, MD  senna-docusate (SENOKOT-S) 8.6-50 MG per tablet Take 1 tablet by mouth daily. Patient not taking: Reported on 01/22/2018 04/03/13   Dhungel, Flonnie Overman, MD  sodium bicarbonate 650 MG tablet Take 1 tablet (650 mg total) by mouth 3 (three) times daily. Patient not taking: Reported on 01/22/2018 04/03/13   Louellen Molder, MD    Family History Family History  Problem Relation Age of Onset  . Hypertension Other     Social History Social History   Tobacco Use  . Smoking status: Current Every Day Smoker  . Smokeless tobacco: Never Used  Substance Use Topics  . Alcohol use: No  . Drug  use: No     Allergies   Patient has no known allergies.   Review of Systems Review of Systems  Constitutional:       Per HPI, otherwise negative  HENT:       Cochlear implant  Respiratory:       Per HPI, otherwise negative  Cardiovascular:       Per HPI, otherwise negative  Gastrointestinal: Positive for diarrhea and nausea. Negative for abdominal pain and vomiting.  Endocrine:       Negative aside from HPI  Genitourinary:       Neg aside from HPI     Musculoskeletal:       Per HPI, otherwise negative  Skin: Negative.   Neurological: Positive for weakness and light-headedness.     Physical Exam Updated Vital Signs BP 111/71 (BP Location: Left Arm)   Pulse 70   Temp 97.6 F (36.4 C) (Oral)   Resp 17   SpO2 100%   Physical Exam  Constitutional: She is oriented to person, place, and time. She appears well-developed and well-nourished. No distress.  HENT:  Head: Normocephalic and atraumatic.  Cochlear implant, left side otherwise unremarkable  Eyes: Conjunctivae and EOM are normal.  Cardiovascular: Normal rate and regular rhythm.  Pulmonary/Chest: Effort normal and breath sounds normal. No stridor. No respiratory distress.  Abdominal: She exhibits no distension and no mass. There is no tenderness. There is no guarding.  Musculoskeletal: She exhibits no edema.  Neurological: She is alert and oriented to person, place, and time. No cranial nerve deficit.  Skin: Skin is warm and dry.  Psychiatric: She has a normal mood and affect.  Nursing note and vitals reviewed.    ED Treatments / Results  Labs (all labs ordered are listed, but only abnormal results are displayed) Labs Reviewed  BASIC METABOLIC PANEL - Abnormal; Notable for the following components:      Result Value   Chloride 114 (*)    CO2 9 (*)    Glucose, Bld 106 (*)    BUN 69 (*)    Creatinine, Ser 4.86 (*)    GFR calc non Af Amer 8 (*)    GFR calc Af Amer 9 (*)    All other components within normal limits  CBC - Abnormal; Notable for the following components:   WBC 11.0 (*)    RBC 3.24 (*)    MCV 112.3 (*)    MCH 37.0 (*)    RDW 15.8 (*)    All other components within normal limits  URINALYSIS, ROUTINE W REFLEX MICROSCOPIC - Abnormal; Notable for the following components:   APPearance CLOUDY (*)    Hgb urine dipstick SMALL (*)    Protein, ur 30 (*)    Nitrite POSITIVE (*)    Leukocytes, UA LARGE (*)    Bacteria, UA FEW (*)    Squamous Epithelial /  LPF TOO NUMEROUS TO COUNT (*)    All other components within normal limits  HEPATIC FUNCTION PANEL - Abnormal; Notable for the following components:   Bilirubin, Direct <0.1 (*)    All other components within normal limits  LIPASE, BLOOD  PROCALCITONIN  CBG MONITORING, ED  I-STAT CG4 LACTIC ACID, ED    EKG  EKG Interpretation None       Radiology Dg Chest 2 View  Result Date: 01/22/2018 CLINICAL DATA:  Confusion weakness. EXAM: CHEST  2 VIEW COMPARISON:  CT chest and chest radiograph 03/30/2013. FINDINGS: Trachea is midline. Heart size normal. Thoracic  aorta is calcified. Lungs are hyperinflated but clear. No pleural fluid. IMPRESSION: Hyperinflation without acute finding. Electronically Signed   By: Lorin Picket M.D.   On: 01/22/2018 12:34    Procedures Procedures (including critical care time)  Medications Ordered in ED Medications  lactated ringers bolus 1,000 mL (1,000 mLs Intravenous New Bag/Given 01/22/18 1435)  lactated ringers bolus 1,000 mL (0 mLs Intravenous Stopped 01/22/18 1435)  cefTRIAXone (ROCEPHIN) 1 g in dextrose 5 % 50 mL IVPB (0 g Intravenous Stopped 01/22/18 1426)  acetaminophen (TYLENOL) tablet 650 mg (650 mg Oral Given 01/22/18 1423)     Initial Impression / Assessment and Plan / ED Course  I have reviewed the triage vital signs and the nursing notes.  Pertinent labs & imaging results that were available during my care of the patient were reviewed by me and considered in my medical decision making (see chart for details).     2:39 PM Patient in similar condition, awake, alert. All findings discussed with the patient and family members, including remaining labs, all consistent with  Final Clinical Impressions(s) / ED Diagnoses   Final diagnoses:  None    ED Discharge Orders    None       Carmin Muskrat, MD 01/22/18 [phone]

## 2018-01-22 NOTE — ED Notes (Signed)
ED TO INPATIENT HANDOFF REPORT  Name/Age/Gender [name] Born 72 y.o. female  Code Status    Code Status Orders  (From admission, onward)        Start     Ordered   01/22/18 1445  Full code  Continuous     01/22/18 1447    Code Status History    Date Active Date Inactive Code Status Order ID Comments User Context   This patient has a current code status but no historical code status.      Home/SNF/Other Home  Chief Complaint Altered Mental Status/ Diarrhea  Level of Care/Admitting Diagnosis ED Disposition    ED Disposition Condition Comment   Admit  Hospital Area: [facility] [[id number]]  Level of Care: Telemetry [5]  Admit to tele based on following criteria: Other see comments  Comments: uti  Diagnosis: UTI (urinary tract infection) [222979]  Admitting Physician: Reyne Dumas [[id number]]  Attending Physician: Reyne Dumas [[id number]]  Estimated length of stay: past midnight tomorrow  Certification:: I certify this patient will need inpatient services for at least 2 midnights  PT Class (Do Not Modify): Inpatient [101]  PT Acc Code (Do Not Modify): Private [1]       Medical History Past Medical History:  Diagnosis Date  . Hypertension   . Liver disease   . Renal disorder   . Thyroid disease     Allergies No Known Allergies  IV Location/Drains/Wounds Patient Lines/Drains/Airways Status   Active Line/Drains/Airways    Name:   Placement date:   Placement time:   Site:   Days:   Peripheral IV 01/22/18 Left;Lateral Forearm   01/22/18    1314    Forearm   less than 1          Labs/Imaging Results for orders placed or performed during the hospital encounter of 01/22/18 (from the past 48 hour(s))  Basic metabolic panel     Status: Abnormal   Collection Time: 01/22/18 10:13 AM  Result Value Ref Range   Sodium 135 135 - 145 mmol/L   Potassium 4.2 3.5 - 5.1 mmol/L   Chloride 114 (H) 101 - 111 mmol/L   CO2 9 (L) 22 - 32 mmol/L   Glucose, Bld  106 (H) 65 - 99 mg/dL   BUN 69 (H) 6 - 20 mg/dL   Creatinine, Ser 4.86 (H) 0.44 - 1.00 mg/dL   Calcium 9.4 8.9 - 10.3 mg/dL   GFR calc non Af Amer 8 (L) >60 mL/min   GFR calc Af Amer 9 (L) >60 mL/min    Comment: (NOTE) The eGFR has been calculated using the CKD EPI equation. This calculation has not been validated in all clinical situations. eGFR's persistently <60 mL/min signify possible Chronic Kidney Disease.    Anion gap 12 5 - 15  CBC     Status: Abnormal   Collection Time: 01/22/18 10:13 AM  Result Value Ref Range   WBC 11.0 (H) 4.0 - 10.5 K/uL   RBC 3.24 (L) 3.87 - 5.11 MIL/uL   Hemoglobin 12.0 12.0 - 15.0 g/dL   HCT 36.4 36.0 - 46.0 %   MCV 112.3 (H) 78.0 - 100.0 fL   MCH 37.0 (H) 26.0 - 34.0 pg   MCHC 33.0 30.0 - 36.0 g/dL   RDW 15.8 (H) 11.5 - 15.5 %   Platelets 220 150 - 400 K/uL  Hepatic function panel     Status: Abnormal   Collection Time: 01/22/18 10:13 AM  Result Value Ref Range  Total Protein 7.7 6.5 - 8.1 g/dL   Albumin 4.5 3.5 - 5.0 g/dL   AST 26 15 - 41 U/L   ALT 18 14 - 54 U/L   Alkaline Phosphatase 86 38 - 126 U/L   Total Bilirubin 0.3 0.3 - 1.2 mg/dL   Bilirubin, Direct <0.1 (L) 0.1 - 0.5 mg/dL   Indirect Bilirubin NOT CALCULATED 0.3 - 0.9 mg/dL  Lipase, blood     Status: None   Collection Time: 01/22/18 10:13 AM  Result Value Ref Range   Lipase 40 11 - 51 U/L  Urinalysis, Routine w reflex microscopic     Status: Abnormal   Collection Time: 01/22/18 12:36 PM  Result Value Ref Range   Color, Urine YELLOW YELLOW   APPearance CLOUDY (A) CLEAR   Specific Gravity, Urine 1.020 1.005 - 1.030   pH 5.0 5.0 - 8.0   Glucose, UA NEGATIVE NEGATIVE mg/dL   Hgb urine dipstick SMALL (A) NEGATIVE   Bilirubin Urine NEGATIVE NEGATIVE   Ketones, ur NEGATIVE NEGATIVE mg/dL   Protein, ur 30 (A) NEGATIVE mg/dL   Nitrite POSITIVE (A) NEGATIVE   Leukocytes, UA LARGE (A) NEGATIVE   RBC / HPF 0-5 0 - 5 RBC/hpf   WBC, UA TOO NUMEROUS TO COUNT 0 - 5 WBC/hpf    Bacteria, UA FEW (A) NONE SEEN   Squamous Epithelial / LPF TOO NUMEROUS TO COUNT (A) NONE SEEN   WBC Clumps PRESENT    Mucus PRESENT   CBG monitoring, ED     Status: None   Collection Time: 01/22/18 12:41 PM  Result Value Ref Range   Glucose-Capillary 72 65 - 99 mg/dL   Comment 1 Notify RN    Comment 2 Document in Chart   I-Stat CG4 Lactic Acid, ED     Status: None   Collection Time: 01/22/18 12:53 PM  Result Value Ref Range   Lactic Acid, Venous 1.90 0.5 - 1.9 mmol/L   Dg Chest 2 View  Result Date: 01/22/2018 CLINICAL DATA:  Confusion weakness. EXAM: CHEST  2 VIEW COMPARISON:  CT chest and chest radiograph 03/30/2013. FINDINGS: Trachea is midline. Heart size normal. Thoracic aorta is calcified. Lungs are hyperinflated but clear. No pleural fluid. IMPRESSION: Hyperinflation without acute finding. Electronically Signed   By: Lorin Picket M.D.   On: 01/22/2018 12:34    Pending Labs Unresulted Labs (From admission, onward)   Start     Ordered   01/23/18 0500  Procalcitonin  Daily,   R     01/22/18 1437   01/23/18 0500  Comprehensive metabolic panel  Tomorrow morning,   R     01/22/18 1447   01/23/18 0500  CBC  Tomorrow morning,   R     01/22/18 1447   01/22/18 1452  Gastrointestinal Panel by PCR , Stool  (Gastrointestinal Panel by PCR, Stool)  Once,   R     01/22/18 1451   01/22/18 1449  Urine Culture  Once,   R     01/22/18 1448   01/22/18 1446  TSH  Once,   R     01/22/18 1447   01/22/18 1445  Procalcitonin - Baseline  Add-on,   STAT     01/22/18 1444      Vitals/Pain Today's Vitals   01/22/18 1001 01/22/18 1257 01/22/18 1400 01/22/18 1435  BP: 132/75 (!) 117/104  111/71  Pulse: 88 63 84 70  Resp: '16 15 14 17  ' Temp: 97.6 F (36.4 C)  TempSrc: Oral     SpO2: 100% 100% 100% 100%    Isolation Precautions Enteric precautions (UV disinfection)  Medications Medications  lactated ringers bolus 1,000 mL (1,000 mLs Intravenous New Bag/Given 01/22/18 1435)  heparin  injection 5,000 Units (not administered)  sodium chloride flush (NS) 0.9 % injection 3 mL (not administered)  sodium chloride flush (NS) 0.9 % injection 3 mL (not administered)  acetaminophen (TYLENOL) tablet 650 mg (not administered)    Or  acetaminophen (TYLENOL) suppository 650 mg (not administered)  ondansetron (ZOFRAN) tablet 4 mg (not administered)    Or  ondansetron (ZOFRAN) injection 4 mg (not administered)  levalbuterol (XOPENEX) nebulizer solution 0.63 mg (not administered)  0.9 %  sodium chloride infusion (not administered)  lactated ringers bolus 1,000 mL (0 mLs Intravenous Stopped 01/22/18 1435)  cefTRIAXone (ROCEPHIN) 1 g in dextrose 5 % 50 mL IVPB (0 g Intravenous Stopped 01/22/18 1426)  acetaminophen (TYLENOL) tablet 650 mg (650 mg Oral Given 01/22/18 1423)    Mobility walks with person assist

## 2018-01-22 NOTE — H&P (Signed)
[facility] History and Physical  [name] VQQ:[id number] DOB: [birth year] DOA: 01/22/2018  Referring physician:   PCP: Sheri Chard, MD   Chief Complaint:    HPI:   72 year old female with a history of hyponatremia,lung nodule, left renal lesion with incidental finding on renal ultrasound, chronic kidney disease stage II followed by [name] ,  Who presents to the ED today because of multiple complaints [name] symptoms started on Sunday. The first symptom that the family noticed was that the patient's oral intake had declined, she also started having diarrhea on Sunday up until yesterday she had multiple loose stools. Patient continued to get weaker. Family felt that the patient had a low-grade fever but did not take her temperature. Daughter states that the patient saw [name] on the 17th and had blood work done and wasn't sure what her baseline creatinine was. [Normal labs in our system since 2014], cr was  1.17 on 4/14. There are also stated that the patient has been confused since Sunday. ED course BP 111/71 (BP Location: Left Arm)   Pulse 70   Temp 97.6 F (36.4 C) (Oral)   Resp 17   SpO2 100%  Creatinine 4.86, white blood cell count 11.0, UA positive, Patient bolused with  Lactated Ringer's, also started on Rocephin for a urinary tract infection Patient is being admitted for UTI possible pyelonephritis of acute kidney injury      Review of Systems: negative for the following  Constitutional: Denies fever, chills, diaphoresis, appetite change and fatigue.  HEENT: Denies photophobia, eye pain, redness, hearing loss, ear pain, congestion, sore throat, rhinorrhea, sneezing, mouth sores, trouble swallowing, neck pain, neck stiffness and tinnitus.  Respiratory: Denies SOB, DOE, cough, chest tightness, and wheezing.  Cardiovascular: Denies chest pain, palpitations and leg swelling.  Gastrointestinal: Positive for diarrhea and nausea. Negative for abdominal pain and vomiting.   Genitourinary: Denies dysuria, urgency, frequency, hematuria, flank pain and difficulty urinating.  Musculoskeletal: Denies myalgias, back pain, joint swelling, arthralgias and gait problem.  Skin: Denies pallor, rash and wound.  Neurological: Positive for weakness and light-headedness  light-headedness, numbness and headaches.  Hematological: Denies adenopathy. Easy bruising, personal or family bleeding history  Psychiatric/Behavioral: Denies suicidal ideation, mood changes, confusion, nervousness, sleep disturbance and agitation       Past Medical History:  Diagnosis Date  . Hypertension   . Liver disease   . Renal disorder   . Thyroid disease      Past Surgical History:  Procedure Laterality Date  . COCHLEAR IMPLANT    . KNEE SURGERY        Social History:  reports that she has been smoking.  she has never used smokeless tobacco. She reports that she does not drink alcohol or use drugs.    No Known Allergies  Family History  Problem Relation Age of Onset  . Hypertension Other         Prior to Admission medications   Medication Sig Start Date End Date Taking? Authorizing Provider  acetaminophen (TYLENOL 8 HOUR ARTHRITIS PAIN) 650 MG CR tablet Take 1,950 mg by mouth daily as needed for pain (ARTHRITIS).   Yes [provider]  hydrochlorothiazide (HYDRODIURIL) 12.5 MG tablet Take 12.5 mg by mouth daily. 12/28/17  Yes [provider]  levothyroxine (SYNTHROID, LEVOTHROID) 100 MCG tablet Take 100 mcg by mouth daily. 12/28/17  Yes [provider]  metoprolol tartrate (LOPRESSOR) 25 MG tablet Take 2 tablets (50 mg total) by mouth 2 (two) times daily. 04/03/13  Yes [name], Nishant, MD  amLODipine (NORVASC) 5 MG tablet Take 1 tablet (5 mg total) by mouth daily. Patient not taking: Reported on 01/22/2018 04/03/13   [name], Flonnie Overman, MD  senna-docusate (SENOKOT-S) 8.6-50 MG per tablet Take 1 tablet by mouth daily. Patient not taking: Reported on  01/22/2018 04/03/13   [name], Flonnie Overman, MD  sodium bicarbonate 650 MG tablet Take 1 tablet (650 mg total) by mouth 3 (three) times daily. Patient not taking: Reported on 01/22/2018 04/03/13   Sheri Molder, MD     Physical Exam: Vitals:   01/22/18 1001 01/22/18 1257 01/22/18 1400 01/22/18 1435  BP: 132/75 (!) 117/104  111/71  Pulse: 88 63 84 70  Resp: $Remo'16 15 14 17  'kykXp$ Temp: 97.6 F (36.4 C)     TempSrc: Oral     SpO2: 100% 100% 100% 100%        Vitals:   01/22/18 1001 01/22/18 1257 01/22/18 1400 01/22/18 1435  BP: 132/75 (!) 117/104  111/71  Pulse: 88 63 84 70  Resp: $Remo'16 15 14 17  'CmYul$ Temp: 97.6 F (36.4 C)     TempSrc: Oral     SpO2: 100% 100% 100% 100%   Constitutional:  Chronically ill-appearing Eyes: PERRL, lids and conjunctivae normal ENMT: Mucous membranes are moist. Posterior pharynx clear of any exudate or lesions.Normal dentition.  Neck: normal, supple, no masses, no thyromegaly Respiratory: clear to auscultation bilaterally, no wheezing, no crackles. Normal respiratory effort. No accessory muscle use.  Cardiovascular: Regular rate and rhythm, no murmurs / rubs / gallops. No extremity edema. 2+ pedal pulses. No carotid bruits.  Abdomen: no tenderness, no masses palpated. No hepatosplenomegaly. Bowel sounds positive.  Musculoskeletal: diffuse muscle wasting,no clubbing / cyanosis. No joint deformity upper and lower extremities. Good ROM, no contractures. Normal muscle tone.  Skin: no rashes, lesions, ulcers. No induration Neurologic: CN 2-12 grossly intact. Sensation intact, DTR normal. Strength 5/5 in all 4.  Psychiatric: Normal judgment and insight. Alert and oriented x 3. Normal mood.     Labs on Admission: I have personally reviewed following labs and imaging studies  CBC: Recent Labs  Lab 01/22/18 1013  WBC 11.0*  HGB 12.0  HCT 36.4  MCV 112.3*  PLT 941    Basic Metabolic Panel: Recent Labs  Lab 01/22/18 1013  NA 135  K 4.2  CL 114*  CO2 9*  GLUCOSE  106*  BUN 69*  CREATININE 4.86*  CALCIUM 9.4    GFR: CrCl cannot be calculated (Unknown ideal weight.).  Liver Function Tests: Recent Labs  Lab 01/22/18 1013  AST 26  ALT 18  ALKPHOS 86  BILITOT 0.3  PROT 7.7  ALBUMIN 4.5   Recent Labs  Lab 01/22/18 1013  LIPASE 40   No results for input(s): AMMONIA in the last 168 hours.  Coagulation Profile: No results for input(s): INR, PROTIME in the last 168 hours. No results for input(s): DDIMER in the last 72 hours.  Cardiac Enzymes: No results for input(s): CKTOTAL, CKMB, CKMBINDEX, TROPONINI in the last 168 hours.  BNP (last 3 results) No results for input(s): PROBNP in the last 8760 hours.  HbA1C: No results for input(s): HGBA1C in the last 72 hours. No results found for: HGBA1C   CBG: Recent Labs  Lab 01/22/18 1241  GLUCAP 72    Lipid Profile: No results for input(s): CHOL, HDL, LDLCALC, TRIG, CHOLHDL, LDLDIRECT in the last 72 hours.  Thyroid Function Tests: No results for input(s): TSH, T4TOTAL, FREET4, T3FREE, THYROIDAB in the last  72 hours.  Anemia Panel: No results for input(s): VITAMINB12, FOLATE, FERRITIN, TIBC, IRON, RETICCTPCT in the last 72 hours.  Urine analysis:    Component Value Date/Time   COLORURINE YELLOW 01/22/2018 1236   APPEARANCEUR CLOUDY (A) 01/22/2018 1236   LABSPEC 1.020 01/22/2018 1236   PHURINE 5.0 01/22/2018 1236   GLUCOSEU NEGATIVE 01/22/2018 1236   HGBUR SMALL (A) 01/22/2018 1236   BILIRUBINUR NEGATIVE 01/22/2018 1236   KETONESUR NEGATIVE 01/22/2018 1236   PROTEINUR 30 (A) 01/22/2018 1236   UROBILINOGEN 0.2 03/30/2013 0519   NITRITE POSITIVE (A) 01/22/2018 1236   LEUKOCYTESUR LARGE (A) 01/22/2018 1236    Sepsis Labs: $RemoveBefo'@LABRCNTIP'bMjXlDJiLpC$ (procalcitonin:4,lacticidven:4) )No results found for this or any previous visit (from the past 240 hour(s)).       Radiological Exams on Admission: Dg Chest 2 View  Result Date: 01/22/2018 CLINICAL DATA:  Confusion weakness. EXAM: CHEST   2 VIEW COMPARISON:  CT chest and chest radiograph 03/30/2013. FINDINGS: Trachea is midline. Heart size normal. Thoracic aorta is calcified. Lungs are hyperinflated but clear. No pleural fluid. IMPRESSION: Hyperinflation without acute finding. Electronically Signed   By: Lorin Picket M.D.   On: 01/22/2018 12:34   Dg Chest 2 View  Result Date: 01/22/2018 CLINICAL DATA:  Confusion weakness. EXAM: CHEST  2 VIEW COMPARISON:  CT chest and chest radiograph 03/30/2013. FINDINGS: Trachea is midline. Heart size normal. Thoracic aorta is calcified. Lungs are hyperinflated but clear. No pleural fluid. IMPRESSION: Hyperinflation without acute finding. Electronically Signed   By: Lorin Picket M.D.   On: 01/22/2018 12:34      EKG: Independently reviewed. none  Assessment/Plan Active Problems:   AKI -likely prerenal   CKD (chronic kidney disease) stage 4, GFR 15-29 ml/min (HCC)   Likely the setting of urinary tract infection/diarrhea and dehydration   Continue aggressive IV hydration   Try to obtain baseline labs from nephrology office    CT abdomen pelvis to rule out obstructive uropathy in the setting of previous known renal cysts [family does not remember ever following up with a urologist]     Follow renal function closely     And continue sodium bicarbonate for chronic kidney disease      UTI (urinary tract infection) -continue Rocephin and follow urine culture, follow pro-calcitonin    Hypertension-blood pressure soft , will lower dose of metoprolol and resume, continue Norvasc  Hypothyroidism-continue Synthroid and check TSH          DVT prophylaxis:  Heparin     Code Status Orders full code  (From admission, onward)       consults called: none   Family Communication: Admission, patients condition and plan of care including tests being ordered have been discussed with the patient / daughter   who indicates understanding and agree with the plan and Code Status  Admission  status: inpatient    Disposition plan: Further plan will depend as patient's clinical course evolves and further radiologic and laboratory data become available. Likely home when stable   At the time of admission, it appears that the appropriate admission status for this patient is INPATIENT .Thisis judged to be reasonable and necessary in order to provide the required intensity of service to ensure the patient's safetygiven thepresenting symptoms, physical exam findings, and initial radiographic and laboratory data in the context of their chronic comorbidities.   Reyne Dumas MD [facility] Pager [phone]  If 7PM-7AM, please contact night-coverage [email or web address] Password [facility]  01/22/2018, 3:42 PM

## 2018-01-22 NOTE — ED Notes (Signed)
Gave report to Calvin 1504.

## 2018-01-22 NOTE — ED Notes (Signed)
Delay in fluid administration due to lack of IV access. IV team at bedside

## 2018-01-22 NOTE — ED Notes (Signed)
Attempted IV placement X2-IV team consult placed and at bedside

## 2018-01-23 DIAGNOSIS — E872 Acidosis: Secondary | ICD-10-CM

## 2018-01-23 DIAGNOSIS — N390 Urinary tract infection, site not specified: Secondary | ICD-10-CM

## 2018-01-23 DIAGNOSIS — N179 Acute kidney failure, unspecified: Principal | ICD-10-CM

## 2018-01-23 DIAGNOSIS — I10 Essential (primary) hypertension: Secondary | ICD-10-CM

## 2018-01-23 LAB — COMPREHENSIVE METABOLIC PANEL
ALBUMIN: 3.5 g/dL (ref 3.5–5.0)
ALK PHOS: 64 U/L (ref 38–126)
ALT: 18 U/L (ref 14–54)
AST: 36 U/L (ref 15–41)
Anion gap: 8 (ref 5–15)
BUN: 60 mg/dL — ABNORMAL HIGH (ref 6–20)
CALCIUM: 8.6 mg/dL — AB (ref 8.9–10.3)
CO2: 11 mmol/L — AB (ref 22–32)
CREATININE: 3.67 mg/dL — AB (ref 0.44–1.00)
Chloride: 116 mmol/L — ABNORMAL HIGH (ref 101–111)
GFR calc Af Amer: 13 mL/min — ABNORMAL LOW (ref >60)
GFR calc non Af Amer: 11 mL/min — ABNORMAL LOW (ref >60)
GLUCOSE: 82 mg/dL (ref 65–99)
Potassium: 3.2 mmol/L — ABNORMAL LOW (ref 3.5–5.1)
SODIUM: 135 mmol/L (ref 135–145)
Total Bilirubin: 0.4 mg/dL (ref 0.3–1.2)
Total Protein: 5.9 g/dL — ABNORMAL LOW (ref 6.5–8.1)

## 2018-01-23 LAB — GASTROINTESTINAL PANEL BY PCR, STOOL (REPLACES STOOL CULTURE)
ADENOVIRUS F40/41: NOT DETECTED
Astrovirus: NOT DETECTED
CRYPTOSPORIDIUM: NOT DETECTED
Campylobacter species: NOT DETECTED
Cyclospora cayetanensis: NOT DETECTED
ENTEROAGGREGATIVE E COLI (EAEC): NOT DETECTED
ENTEROPATHOGENIC E COLI (EPEC): NOT DETECTED
Entamoeba histolytica: NOT DETECTED
Enterotoxigenic E coli (ETEC): NOT DETECTED
GIARDIA LAMBLIA: NOT DETECTED
Norovirus GI/GII: NOT DETECTED
PLESIMONAS SHIGELLOIDES: NOT DETECTED
ROTAVIRUS A: NOT DETECTED
Salmonella species: NOT DETECTED
Sapovirus (I, II, IV, and V): NOT DETECTED
Shiga like toxin producing E coli (STEC): NOT DETECTED
Shigella/Enteroinvasive E coli (EIEC): NOT DETECTED
VIBRIO SPECIES: NOT DETECTED
Vibrio cholerae: NOT DETECTED
YERSINIA ENTEROCOLITICA: NOT DETECTED

## 2018-01-23 LAB — CBC
HCT: 28.5 % — ABNORMAL LOW (ref 36.0–46.0)
Hemoglobin: 9.6 g/dL — ABNORMAL LOW (ref 12.0–15.0)
MCH: 36.8 pg — AB (ref 26.0–34.0)
MCHC: 33.7 g/dL (ref 30.0–36.0)
MCV: 109.2 fL — ABNORMAL HIGH (ref 78.0–100.0)
PLATELETS: 126 10*3/uL — AB (ref 150–400)
RBC: 2.61 MIL/uL — ABNORMAL LOW (ref 3.87–5.11)
RDW: 15.6 % — AB (ref 11.5–15.5)
WBC: 6.3 10*3/uL (ref 4.0–10.5)

## 2018-01-23 LAB — PROCALCITONIN: Procalcitonin: 148.76 ng/mL

## 2018-01-23 MED ORDER — POTASSIUM CHLORIDE CRYS ER 20 MEQ PO TBCR
40.0000 meq | EXTENDED_RELEASE_TABLET | ORAL | Status: AC
  Administered 2018-01-23 (×2): 40 meq via ORAL
  Filled 2018-01-23 (×2): qty 2

## 2018-01-23 MED ORDER — SODIUM BICARBONATE 8.4 % IV SOLN
INTRAVENOUS | Status: DC
  Administered 2018-01-23 – 2018-01-25 (×4): via INTRAVENOUS
  Filled 2018-01-23 (×5): qty 100

## 2018-01-23 MED ORDER — BOOST / RESOURCE BREEZE PO LIQD CUSTOM
1.0000 | Freq: Three times a day (TID) | ORAL | Status: DC
  Administered 2018-01-23 – 2018-01-24 (×5): 1 via ORAL

## 2018-01-23 MED ORDER — LEVOTHYROXINE SODIUM 75 MCG PO TABS
75.0000 ug | ORAL_TABLET | Freq: Every day | ORAL | Status: DC
  Administered 2018-01-24 – 2018-01-25 (×2): 75 ug via ORAL
  Filled 2018-01-23 (×2): qty 1

## 2018-01-23 NOTE — Care Management Note (Signed)
Case Management Note  Patient Details  Name: Kourtlyn Charlet MRN: [id number] Date of Birth: 1946/04/12  Subjective/Objective:                  aki and pyelonephritis  Action/Plan: Date: January 23, 2018 Velva Harman, BSN, [place], [place] Chart and notes review for patient progress and needs. Will follow for case management and discharge needs. No cm or discharge needs present at time of this review. Next review date: [id number] Expected Discharge Date:  (unknown)               Expected Discharge Plan:  Home/Self Care  In-House Referral:     Discharge planning Services  CM Consult  Post Acute Care Choice:    Choice offered to:     DME Arranged:    DME Agency:     HH Arranged:    HH Agency:     Status of Service:  In process, will continue to follow  If discussed at Long Length of Stay Meetings, dates discussed:    Additional Comments:  Leeroy Cha, RN 01/23/2018, 8:41 AM

## 2018-01-23 NOTE — Progress Notes (Addendum)
PROGRESS NOTE        PATIENT DETAILS Name: [name] Age: 72 y.o. Sex: female Date of Birth: 1946/02/03 Admit Date: 01/22/2018 Admitting Physician Reyne Dumas, MD HFS:[id number], Bailey Mech, MD  Brief Narrative: Patient is a 72 y.o. female with history of chronic kidney disease stage III, significant hearing impairment status post cochlear implant-presented to the hospital several days history of nausea, vomiting, diarrhea and very poor oral intake.  She was found to have acute kidney injury and admitted to the hospitalist service.  See below for further details  Subjective: No further vomiting-diarrhea seems to have improved drastically.  No abdominal pain.  No chest pain or shortness of breath.  Lying comfortably in bed.  Assessment/Plan: Acute kidney injury on chronic kidney disease stage III: Acute kidney injury likely hemodynamically mediated-continue hydration-avoid nephrotoxic agents and follow renal function closely.  CT of the abdomen did not show any hydronephrosis.  Nausea/vomiting/diarrhea: Probable viral syndrome, markedly improved with supportive care.  CT of the abdomen negative for acute abnormalities.  Await GI profile.  Non-anion gap metabolic acidosis: Likely secondary to above-we will switch IV fluids to D5 with bicarbonate.  Abdomen is benign-soft and nontender on exam.  Continue to follow electrolytes closely.  She also appears to have anabolic acidosis chronically and appears to be on sodium bicarb tablets as an outpatient.  Hypokalemia: Likely secondary to GI loss-replete and recheck  UTI: Does acknowledge dysuria-continue Rocephin and follow cultures.  Hypothyroidism: TSH suppressed-decrease levothyroxine to 75 mcg.  Hypertension: His blood pressure soft-we will stop all antihypertensives and follow for now.  Significant hearing impairment-status post cochlear implant.  DVT Prophylaxis: Prophylactic Heparin   Code Status: Full code     Family Communication: Granddaughter and sister at bedside  Disposition Plan: Remain inpatient-home the next 1-2 days depending on improvement in renal function  Antimicrobial agents: Anti-infectives (From admission, onward)   Start     Dose/Rate Route Frequency Ordered Stop   01/23/18 1400  cefTRIAXone (ROCEPHIN) 1 g in dextrose 5 % 50 mL IVPB     1 g 100 mL/hr over 30 Minutes Intravenous Every 24 hours 01/22/18 1945     01/22/18 1400  cefTRIAXone (ROCEPHIN) 1 g in dextrose 5 % 50 mL IVPB     1 g 100 mL/hr over 30 Minutes Intravenous  Once 01/22/18 1349 01/22/18 1426      Procedures: None  CONSULTS:  None  Time spent: 25 minutes-Greater than 50% of this time was spent in counseling, explanation of diagnosis, planning of further management, and coordination of care.  MEDICATIONS: Scheduled Meds: . amLODipine  5 mg Oral Daily  . heparin  5,000 Units Subcutaneous Q8H  . [START ON 01/24/2018] levothyroxine  75 mcg Oral QAC breakfast  . metoprolol tartrate  12.5 mg Oral BID  . potassium chloride  40 mEq Oral Q4H  . senna-docusate  1 tablet Oral Daily  . sodium bicarbonate  650 mg Oral TID  . sodium chloride flush  3 mL Intravenous Q12H   Continuous Infusions: . sodium chloride    . cefTRIAXone (ROCEPHIN)  IV    .  sodium bicarbonate  infusion 1000 mL     PRN Meds:.sodium chloride, acetaminophen **OR** acetaminophen, levalbuterol, ondansetron **OR** ondansetron (ZOFRAN) IV, sodium chloride flush   PHYSICAL EXAM: Vital signs: Vitals:   01/22/18 1633 01/22/18 1648 01/22/18 2050 01/23/18 0449  BP: 111/71 134/64 109/62 (!) 104/57  Pulse: 70 70 (!) 58 63  Resp: $Remo'17 16 13 11  'FUFkS$ Temp: 97.6 F (36.4 C) (!) 97.3 F (36.3 C) 98.2 F (36.8 C) 98.2 F (36.8 C)  TempSrc: Oral Oral Oral Oral  SpO2:  99% 100% 100%  Weight: 43.4 kg (95 lb 9.6 oz)     Height: $Remove'5\' 1"'BujCkyK$  (1.549 m)      Filed Weights   01/22/18 1633  Weight: 43.4 kg (95 lb 9.6 oz)   Body mass index is 18.06 kg/m.    General appearance :Awake, alert, not in any distress.  Eyes:, pupils equally reactive to light and accomodation,no scleral icterus.Pink conjunctiva HEENT: Atraumatic and Normocephalic Neck: supple, no JVD. No cervical lymphadenopathy. No thyromegaly Resp:Good air entry bilaterally, no added sounds  CVS: S1 S2 regular, no murmurs.  GI: Bowel sounds present, Non tender and not distended with no gaurding, rigidity or rebound.No organomegaly Extremities: B/L Lower Ext shows no edema, both legs are warm to touch Neurology:  speech clear,Non focal, sensation is grossly intact. Psychiatric: Normal judgment and insight. Alert and oriented x 3. Normal mood. Musculoskeletal:No digital cyanosis Skin:No Rash, warm and dry Wounds:N/A  I have personally reviewed following labs and imaging studies  LABORATORY DATA: CBC: Recent Labs  Lab 01/22/18 1013 01/23/18 0618  WBC 11.0* 6.3  HGB 12.0 9.6*  HCT 36.4 28.5*  MCV 112.3* 109.2*  PLT 220 126*    Basic Metabolic Panel: Recent Labs  Lab 01/22/18 1013 01/23/18 0618  NA 135 135  K 4.2 3.2*  CL 114* 116*  CO2 9* 11*  GLUCOSE 106* 82  BUN 69* 60*  CREATININE 4.86* 3.67*  CALCIUM 9.4 8.6*    GFR: Estimated Creatinine Clearance: 9.4 mL/min (A) (by C-G formula based on SCr of 3.67 mg/dL (H)).  Liver Function Tests: Recent Labs  Lab 01/22/18 1013 01/23/18 0618  AST 26 36  ALT 18 18  ALKPHOS 86 64  BILITOT 0.3 0.4  PROT 7.7 5.9*  ALBUMIN 4.5 3.5   Recent Labs  Lab 01/22/18 1013  LIPASE 40   No results for input(s): AMMONIA in the last 168 hours.  Coagulation Profile: No results for input(s): INR, PROTIME in the last 168 hours.  Cardiac Enzymes: No results for input(s): CKTOTAL, CKMB, CKMBINDEX, TROPONINI in the last 168 hours.  BNP (last 3 results) No results for input(s): PROBNP in the last 8760 hours.  HbA1C: No results for input(s): HGBA1C in the last 72 hours.  CBG: Recent Labs  Lab 01/22/18 1241   GLUCAP 72    Lipid Profile: No results for input(s): CHOL, HDL, LDLCALC, TRIG, CHOLHDL, LDLDIRECT in the last 72 hours.  Thyroid Function Tests: Recent Labs    01/22/18 1528  TSH 0.044*    Anemia Panel: No results for input(s): VITAMINB12, FOLATE, FERRITIN, TIBC, IRON, RETICCTPCT in the last 72 hours.  Urine analysis:    Component Value Date/Time   COLORURINE YELLOW 01/22/2018 1236   APPEARANCEUR CLOUDY (A) 01/22/2018 1236   LABSPEC 1.020 01/22/2018 1236   PHURINE 5.0 01/22/2018 1236   GLUCOSEU NEGATIVE 01/22/2018 1236   HGBUR SMALL (A) 01/22/2018 1236   BILIRUBINUR NEGATIVE 01/22/2018 1236   KETONESUR NEGATIVE 01/22/2018 1236   PROTEINUR 30 (A) 01/22/2018 1236   UROBILINOGEN 0.2 03/30/2013 0519   NITRITE POSITIVE (A) 01/22/2018 1236   LEUKOCYTESUR LARGE (A) 01/22/2018 1236    Sepsis Labs: Lactic Acid, Venous    Component Value Date/Time   LATICACIDVEN 1.90 01/22/2018 1253  MICROBIOLOGY: No results found for this or any previous visit (from the past 240 hour(s)).  RADIOLOGY STUDIES/RESULTS: Ct Abdomen Pelvis Wo Contrast  Result Date: 01/22/2018 CLINICAL DATA:  Weakness and confusion.  Loose stools. EXAM: CT ABDOMEN AND PELVIS WITHOUT CONTRAST TECHNIQUE: Multidetector CT imaging of the abdomen and pelvis was performed following the standard protocol without IV contrast. COMPARISON:  None. FINDINGS: Lower chest: Lung bases are clear. Hepatobiliary: Liver is nodular. No IV contrast. Gallbladder is distended to 4.5 cm. No gallbladder inflammation. Common bile duct normal Pancreas: Pancreas is normal. No ductal dilatation. No pancreatic inflammation. Spleen: Normal spleen Adrenals/urinary tract: Adrenal glands and kidneys are normal. The ureters and bladder normal. Stomach/Bowel: Stomach, small bowel, appendix, and cecum are normal. The colon and rectosigmoid colon are normal. Vascular/Lymphatic: Abdominal aorta is normal caliber with atherosclerotic calcification. There  is no retroperitoneal or periportal lymphadenopathy. No pelvic lymphadenopathy. Reproductive: Uterus and ovaries normal Other: No free fluid. Musculoskeletal: No aggressive osseous lesion. IMPRESSION: 1. Nodule liver suggests underlying cirrhosis. 2. Distended gallbladder without evidence acute inflammation. 3. No acute findings in the abdomen or pelvis. 4. Normal appendix. Aortic Atherosclerosis (ICD10-I70.0). Electronically Signed   By: Suzy Bouchard M.D.   On: 01/22/2018 16:10   Dg Chest 2 View  Result Date: 01/22/2018 CLINICAL DATA:  Confusion weakness. EXAM: CHEST  2 VIEW COMPARISON:  CT chest and chest radiograph 03/30/2013. FINDINGS: Trachea is midline. Heart size normal. Thoracic aorta is calcified. Lungs are hyperinflated but clear. No pleural fluid. IMPRESSION: Hyperinflation without acute finding. Electronically Signed   By: Lorin Picket M.D.   On: 01/22/2018 12:34     LOS: 1 day   Oren Binet, MD  [facility] Pager:[phone]  If 7PM-7AM, please contact night-coverage [email or web address] Password [facility] 01/23/2018, 9:55 AM

## 2018-01-23 NOTE — Progress Notes (Signed)
Initial Nutrition Assessment  DOCUMENTATION CODES:   Underweight, Severe malnutrition in context of social or environmental circumstances  INTERVENTION:    Boost Breeze po TID, each supplement provides 250 kcal and 9 grams of protein  Magic cup TID with meals, each supplement provides 290 kcal and 9 grams of protein  Recommend checking magnesium and phosphorus level  NUTRITION DIAGNOSIS:   Severe Malnutrition related to social / environmental circumstances(depression) as evidenced by energy intake < 75% for > or equal to 3 months, severe fat depletion, severe muscle depletion.  GOAL:   Patient will meet greater than or equal to 90% of their needs  MONITOR:   PO intake, Supplement acceptance, Weight trends, Labs, I & O's  REASON FOR ASSESSMENT:   Malnutrition Screening Tool    ASSESSMENT:   Pt with PMH significant for CKD IV, hearing loss s/p cochlear implant, HTN, and liver disease. Presents this admission with AKI on CKD IV likely in the setting of UTI and dehydration.    Spoke with pt and family at bedside. Pt reports having poor PO intake since her husband passed in May 2018. Right before he passed, pt was taking care of him. He would not eat, so she wouldn't eat either. States she typically eats bites throughout the day of mostly soup. Pt does not use protein supplementation at home. Discussed the importance of protein intake for preservation of lean body mass. Talked about protein supplement options including unflavored protein powder she could sprinkle on regular food. Pt does not like "milky" supplements. Will stick to clear and provide Magic Cups as pt enjoys ice cream.   Family states pt has always been "petite" and her UBW stays around 120. The last time pt was right before her husband passed 7 months. Weight records are limited per chart. Nutrition-Focused physical exam completed.   Would recommend checking magnesium and phosphorus levels this stay due to prolonged  decreased PO intake.   Medications reviewed and include: 40 mEq KCl, senokot, IV abx, 100 mEq sodium bicarb with D5 @ 100 ml/hr Labs reviewed: K 3.2 (L) BUN 60 (H) Creatinine 3.67 (H)   NUTRITION - FOCUSED PHYSICAL EXAM:    Most Recent Value  Orbital Region  Severe depletion  Upper Arm Region  Severe depletion  Thoracic and Lumbar Region  Severe depletion  Buccal Region  Severe depletion  Temple Region  Severe depletion  Clavicle Bone Region  Severe depletion  Clavicle and Acromion Bone Region  Severe depletion  Scapular Bone Region  Severe depletion  Dorsal Hand  Severe depletion  Patellar Region  Severe depletion  Anterior Thigh Region  Severe depletion  Posterior Calf Region  Severe depletion  Edema (RD Assessment)  None  Hair  Reviewed  Eyes  Reviewed  Mouth  Reviewed  Skin  Reviewed  Nails  Reviewed     Diet Order:  Diet regular Room service appropriate? Yes; Fluid consistency: Thin  EDUCATION NEEDS:   Education needs have been addressed  Skin:  Skin Assessment: Reviewed RN Assessment  Last BM:  01/23/18  Height:   Ht Readings from Last 1 Encounters:  01/22/18 $RemoveB'5\' 1"'kVDUJDBC$  (1.549 m)    Weight:   Wt Readings from Last 1 Encounters:  01/22/18 95 lb 9.6 oz (43.4 kg)    Ideal Body Weight:  47.7 kg  BMI:  Body mass index is 18.06 kg/m.  Estimated Nutritional Needs:   Kcal:  1550-1750 kcal/day  Protein:  70-80 g/day  Fluid:  >1.5 L/day  Mariana Single RD, LDN Clinical Nutrition Pager # [phone]

## 2018-01-24 ENCOUNTER — Inpatient Hospital Stay (HOSPITAL_COMMUNITY): Payer: Medicare Other

## 2018-01-24 DIAGNOSIS — R9431 Abnormal electrocardiogram [ECG] [EKG]: Secondary | ICD-10-CM

## 2018-01-24 DIAGNOSIS — R197 Diarrhea, unspecified: Secondary | ICD-10-CM

## 2018-01-24 DIAGNOSIS — R1110 Vomiting, unspecified: Secondary | ICD-10-CM

## 2018-01-24 LAB — URINE CULTURE: Culture: >=100000 — AB

## 2018-01-24 LAB — ECHOCARDIOGRAM COMPLETE
HEIGHTINCHES: 61 in
Weight: 1529.6 oz

## 2018-01-24 LAB — BASIC METABOLIC PANEL
Anion gap: 10 (ref 5–15)
BUN: 45 mg/dL — ABNORMAL HIGH (ref 6–20)
CHLORIDE: 107 mmol/L (ref 101–111)
CO2: 20 mmol/L — AB (ref 22–32)
CREATININE: 2.38 mg/dL — AB (ref 0.44–1.00)
Calcium: 8.5 mg/dL — ABNORMAL LOW (ref 8.9–10.3)
GFR calc non Af Amer: 19 mL/min — ABNORMAL LOW (ref >60)
GFR, EST AFRICAN AMERICAN: 22 mL/min — AB (ref >60)
Glucose, Bld: 136 mg/dL — ABNORMAL HIGH (ref 65–99)
Potassium: 3.2 mmol/L — ABNORMAL LOW (ref 3.5–5.1)
Sodium: 137 mmol/L (ref 135–145)

## 2018-01-24 LAB — PROCALCITONIN: Procalcitonin: 84.59 ng/mL

## 2018-01-24 MED ORDER — METOPROLOL TARTRATE 25 MG PO TABS
25.0000 mg | ORAL_TABLET | Freq: Two times a day (BID) | ORAL | Status: DC
  Administered 2018-01-24: 25 mg via ORAL
  Filled 2018-01-24 (×2): qty 1

## 2018-01-24 MED ORDER — POTASSIUM CHLORIDE CRYS ER 20 MEQ PO TBCR
40.0000 meq | EXTENDED_RELEASE_TABLET | ORAL | Status: AC
  Administered 2018-01-24 (×2): 40 meq via ORAL
  Filled 2018-01-24 (×2): qty 2

## 2018-01-24 MED ORDER — LOPERAMIDE HCL 2 MG PO CAPS
2.0000 mg | ORAL_CAPSULE | ORAL | Status: DC | PRN
  Administered 2018-01-24: 2 mg via ORAL
  Filled 2018-01-24: qty 1

## 2018-01-24 MED ORDER — PROMETHAZINE HCL 25 MG/ML IJ SOLN
12.5000 mg | Freq: Four times a day (QID) | INTRAMUSCULAR | Status: DC | PRN
  Administered 2018-01-24: 12.5 mg via INTRAVENOUS
  Filled 2018-01-24: qty 1

## 2018-01-24 NOTE — Progress Notes (Signed)
PROGRESS NOTE        PATIENT DETAILS Name: [name] Age: 72 y.o. Sex: female Date of Birth: 1946-01-13 Admit Date: 01/22/2018 Admitting Physician Reyne Dumas, MD [id number], Bailey Mech, MD  Brief Narrative: Patient is a 72 y.o. female with history of chronic kidney disease stage III, significant hearing impairment status post cochlear implant-presented to the hospital several days history of nausea, vomiting, diarrhea and very poor oral intake.  She was found to have acute kidney injury and admitted to the hospitalist service.  See below for further details  Subjective: Vomiting better-one episode of vomiting last night-diarrhea continues-but has slowed down  Assessment/Plan: Acute kidney injury on chronic kidney disease stage III: Acute kidney injury likely hemodynamically mediated-improving with hydration.  Continue to follow renal function.  Avoid nephrotoxic agents.  CT of the abdomen did not show any hydronephrosis.  Nausea/vomiting/diarrhea: Probable viral syndrome, continue supportive care-add Phenergan for nausea-since GI pathogen panel negative-we will see if as needed Imodium helps with the diarrhea.  Continues to have a very benign abdominal exam.  Non-anion gap metabolic acidosis: Likely secondary to acute kidney injury-improving with IV fluids likely secondary to above-we will switch IV fluids with bicarb-an improvement in renal function.  She appears to have chronic metabolic acidosis at baseline-we will resume oral bicarb supplementation over the next few days.    Hypokalemia: Secondary to GI loss-replete and recheck.   Episodes of narrow complex tachycardia likely SVT: Start low-dose beta-blocker and follow.  Check echo.  UTI: Did acknowledge dysuria-continue Rocephin-urine culture positive for E. coli sensitive to Rocephin.  Suspect just needs a total of 3 days of treatment.  Hypothyroidism: TSH suppressed-decreased levothyroxine to 75  mcg-repeat TSH in 3 months.  Hypertension: Stable-starting low-dose beta-blocker given narrow complex tachycardia.  Follow closely  Significant hearing impairment-status post cochlear implant.  DVT Prophylaxis: Prophylactic Heparin   Code Status: Full code   Family Communication: None at bedside   Disposition Plan: Remain inpatient-home the next 1-2 days depending on improvement in renal function  Antimicrobial agents: Anti-infectives (From admission, onward)   Start     Dose/Rate Route Frequency Ordered Stop   01/23/18 1400  cefTRIAXone (ROCEPHIN) 1 g in dextrose 5 % 50 mL IVPB     1 g 100 mL/hr over 30 Minutes Intravenous Every 24 hours 01/22/18 1945 01/24/18 2359   01/22/18 1400  cefTRIAXone (ROCEPHIN) 1 g in dextrose 5 % 50 mL IVPB     1 g 100 mL/hr over 30 Minutes Intravenous  Once 01/22/18 1349 01/22/18 1426      Procedures: None  CONSULTS:  None  Time spent: 25 minutes-Greater than 50% of this time was spent in counseling, explanation of diagnosis, planning of further management, and coordination of care.  MEDICATIONS: Scheduled Meds: . feeding supplement  1 Container Oral TID BM  . heparin  5,000 Units Subcutaneous Q8H  . levothyroxine  75 mcg Oral QAC breakfast  . potassium chloride  40 mEq Oral Q4H  . senna-docusate  1 tablet Oral Daily  . sodium chloride flush  3 mL Intravenous Q12H   Continuous Infusions: . sodium chloride Stopped (01/23/18 1025)  . cefTRIAXone (ROCEPHIN)  IV Stopped (01/23/18 1414)  .  sodium bicarbonate  infusion 1000 mL 100 mL/hr at 01/24/18 0715   PRN Meds:.sodium chloride, acetaminophen **OR** acetaminophen, levalbuterol, loperamide, ondansetron **OR** ondansetron (ZOFRAN) IV, promethazine, sodium  chloride flush   PHYSICAL EXAM: Vital signs: Vitals:   01/23/18 0449 01/23/18 1445 01/23/18 2045 01/24/18 0506  BP: (!) 104/57 94/61 111/72 116/67  Pulse: 63 73 86 84  Resp: $Remo'11 16 11 15  'PELgl$ Temp: 98.2 F (36.8 C) 97.9 F (36.6  C) 98.1 F (36.7 C) 97.6 F (36.4 C)  TempSrc: Oral Oral Oral Oral  SpO2: 100% 100% 100% 100%  Weight:      Height:       Filed Weights   01/22/18 1633  Weight: 43.4 kg (95 lb 9.6 oz)   Body mass index is 18.06 kg/m.   General appearance :Awake, alert, not in any distress.  Very hard of hearing Eyes:, pupils equally reactive to light and accomodation,no scleral icterus. HEENT: Atraumatic and Normocephalic Neck: supple, no JVD. Resp:Good air entry bilaterally, no rales or rhonchi CVS: S1 S2 regular, no murmurs.  GI: Bowel sounds present, Non tender and not distended with no gaurding, rigidity or rebound. Extremities: B/L Lower Ext shows no edema, both legs are warm to touch Neurology:  speech clear,Non focal, sensation is grossly intact. Psychiatric: Normal judgment and insight. Normal mood. Musculoskeletal:No digital cyanosis Skin:No Rash, warm and dry Wounds:N/A  I have personally reviewed following labs and imaging studies  LABORATORY DATA: CBC: Recent Labs  Lab 01/22/18 1013 01/23/18 0618  WBC 11.0* 6.3  HGB 12.0 9.6*  HCT 36.4 28.5*  MCV 112.3* 109.2*  PLT 220 126*    Basic Metabolic Panel: Recent Labs  Lab 01/22/18 1013 01/23/18 0618 01/24/18 0541  NA 135 135 137  K 4.2 3.2* 3.2*  CL 114* 116* 107  CO2 9* 11* 20*  GLUCOSE 106* 82 136*  BUN 69* 60* 45*  CREATININE 4.86* 3.67* 2.38*  CALCIUM 9.4 8.6* 8.5*    GFR: Estimated Creatinine Clearance: 14.4 mL/min (A) (by C-G formula based on SCr of 2.38 mg/dL (H)).  Liver Function Tests: Recent Labs  Lab 01/22/18 1013 01/23/18 0618  AST 26 36  ALT 18 18  ALKPHOS 86 64  BILITOT 0.3 0.4  PROT 7.7 5.9*  ALBUMIN 4.5 3.5   Recent Labs  Lab 01/22/18 1013  LIPASE 40   No results for input(s): AMMONIA in the last 168 hours.  Coagulation Profile: No results for input(s): INR, PROTIME in the last 168 hours.  Cardiac Enzymes: No results for input(s): CKTOTAL, CKMB, CKMBINDEX, TROPONINI in the  last 168 hours.  BNP (last 3 results) No results for input(s): PROBNP in the last 8760 hours.  HbA1C: No results for input(s): HGBA1C in the last 72 hours.  CBG: Recent Labs  Lab 01/22/18 1241  GLUCAP 72    Lipid Profile: No results for input(s): CHOL, HDL, LDLCALC, TRIG, CHOLHDL, LDLDIRECT in the last 72 hours.  Thyroid Function Tests: Recent Labs    01/22/18 1528  TSH 0.044*    Anemia Panel: No results for input(s): VITAMINB12, FOLATE, FERRITIN, TIBC, IRON, RETICCTPCT in the last 72 hours.  Urine analysis:    Component Value Date/Time   COLORURINE YELLOW 01/22/2018 1236   APPEARANCEUR CLOUDY (A) 01/22/2018 1236   LABSPEC 1.020 01/22/2018 1236   PHURINE 5.0 01/22/2018 1236   GLUCOSEU NEGATIVE 01/22/2018 1236   HGBUR SMALL (A) 01/22/2018 1236   BILIRUBINUR NEGATIVE 01/22/2018 1236   KETONESUR NEGATIVE 01/22/2018 1236   PROTEINUR 30 (A) 01/22/2018 1236   UROBILINOGEN 0.2 03/30/2013 0519   NITRITE POSITIVE (A) 01/22/2018 1236   LEUKOCYTESUR LARGE (A) 01/22/2018 1236    Sepsis Labs: Lactic Acid, Venous  Component Value Date/Time   LATICACIDVEN 1.90 01/22/2018 1253    MICROBIOLOGY: Recent Results (from the past 240 hour(s))  Urine Culture     Status: Abnormal   Collection Time: 01/22/18  3:29 PM  Result Value Ref Range Status   Specimen Description   Final    URINE, CLEAN CATCH Performed at [facility], [place] [address] [place], [place] [address]    Special Requests   Final    NONE Performed at [facility], [place] [address], [place], [place] [address]    Culture >=100,000 COLONIES/mL ESCHERICHIA COLI (A)  Final   Report Status 01/24/2018 FINAL  Final   Organism ID, Bacteria ESCHERICHIA COLI (A)  Final      Susceptibility   Escherichia coli - MIC*    AMPICILLIN >=32 RESISTANT Resistant     CEFAZOLIN >=64 RESISTANT Resistant     CEFTRIAXONE <=1 SENSITIVE Sensitive     CIPROFLOXACIN <=0.25 SENSITIVE Sensitive      GENTAMICIN <=1 SENSITIVE Sensitive     IMIPENEM <=0.25 SENSITIVE Sensitive     NITROFURANTOIN <=16 SENSITIVE Sensitive     TRIMETH/SULFA <=20 SENSITIVE Sensitive     AMPICILLIN/SULBACTAM >=32 RESISTANT Resistant     PIP/TAZO 8 SENSITIVE Sensitive     Extended ESBL NEGATIVE Sensitive     * >=100,000 COLONIES/mL ESCHERICHIA COLI  Gastrointestinal Panel by PCR , Stool     Status: None   Collection Time: 01/23/18 12:42 AM  Result Value Ref Range Status   Campylobacter species NOT DETECTED NOT DETECTED Final   Plesimonas shigelloides NOT DETECTED NOT DETECTED Final   Salmonella species NOT DETECTED NOT DETECTED Final   Yersinia enterocolitica NOT DETECTED NOT DETECTED Final   Vibrio species NOT DETECTED NOT DETECTED Final   Vibrio cholerae NOT DETECTED NOT DETECTED Final   Enteroaggregative E coli (EAEC) NOT DETECTED NOT DETECTED Final   Enteropathogenic E coli (EPEC) NOT DETECTED NOT DETECTED Final   Enterotoxigenic E coli (ETEC) NOT DETECTED NOT DETECTED Final   Shiga like toxin producing E coli (STEC) NOT DETECTED NOT DETECTED Final   Shigella/Enteroinvasive E coli (EIEC) NOT DETECTED NOT DETECTED Final   Cryptosporidium NOT DETECTED NOT DETECTED Final   Cyclospora cayetanensis NOT DETECTED NOT DETECTED Final   Entamoeba histolytica NOT DETECTED NOT DETECTED Final   Giardia lamblia NOT DETECTED NOT DETECTED Final   Adenovirus F40/41 NOT DETECTED NOT DETECTED Final   Astrovirus NOT DETECTED NOT DETECTED Final   Norovirus GI/GII NOT DETECTED NOT DETECTED Final   Rotavirus A NOT DETECTED NOT DETECTED Final   Sapovirus (I, II, IV, and V) NOT DETECTED NOT DETECTED Final    Comment: Performed at [facility], [address] [place], [place] [address]    RADIOLOGY STUDIES/RESULTS: Ct Abdomen Pelvis Wo Contrast  Result Date: 01/22/2018 CLINICAL DATA:  Weakness and confusion.  Loose stools. EXAM: CT ABDOMEN AND PELVIS WITHOUT CONTRAST TECHNIQUE: Multidetector CT imaging of the  abdomen and pelvis was performed following the standard protocol without IV contrast. COMPARISON:  None. FINDINGS: Lower chest: Lung bases are clear. Hepatobiliary: Liver is nodular. No IV contrast. Gallbladder is distended to 4.5 cm. No gallbladder inflammation. Common bile duct normal Pancreas: Pancreas is normal. No ductal dilatation. No pancreatic inflammation. Spleen: Normal spleen Adrenals/urinary tract: Adrenal glands and kidneys are normal. The ureters and bladder normal. Stomach/Bowel: Stomach, small bowel, appendix, and cecum are normal. The colon and rectosigmoid colon are normal. Vascular/Lymphatic: Abdominal aorta is normal caliber with atherosclerotic calcification. There  is no retroperitoneal or periportal lymphadenopathy. No pelvic lymphadenopathy. Reproductive: Uterus and ovaries normal Other: No free fluid. Musculoskeletal: No aggressive osseous lesion. IMPRESSION: 1. Nodule liver suggests underlying cirrhosis. 2. Distended gallbladder without evidence acute inflammation. 3. No acute findings in the abdomen or pelvis. 4. Normal appendix. Aortic Atherosclerosis (ICD10-I70.0). Electronically Signed   By: Suzy Bouchard M.D.   On: 01/22/2018 16:10   Dg Chest 2 View  Result Date: 01/22/2018 CLINICAL DATA:  Confusion weakness. EXAM: CHEST  2 VIEW COMPARISON:  CT chest and chest radiograph 03/30/2013. FINDINGS: Trachea is midline. Heart size normal. Thoracic aorta is calcified. Lungs are hyperinflated but clear. No pleural fluid. IMPRESSION: Hyperinflation without acute finding. Electronically Signed   By: Lorin Picket M.D.   On: 01/22/2018 12:34     LOS: 2 days   Oren Binet, MD  [facility] Pager:[phone]  If 7PM-7AM, please contact night-coverage [email or web address] Password [facility] 01/24/2018, 12:49 PM

## 2018-01-24 NOTE — Progress Notes (Signed)
  Echocardiogram 2D Echocardiogram has been performed.  Darlina Sicilian M 01/24/2018, 2:11 PM

## 2018-01-25 DIAGNOSIS — N184 Chronic kidney disease, stage 4 (severe): Secondary | ICD-10-CM

## 2018-01-25 DIAGNOSIS — N3000 Acute cystitis without hematuria: Secondary | ICD-10-CM

## 2018-01-25 DIAGNOSIS — E43 Unspecified severe protein-calorie malnutrition: Secondary | ICD-10-CM

## 2018-01-25 LAB — BASIC METABOLIC PANEL
Anion gap: 8 (ref 5–15)
BUN: 30 mg/dL — AB (ref 6–20)
CHLORIDE: 97 mmol/L — AB (ref 101–111)
CO2: 32 mmol/L (ref 22–32)
CREATININE: 1.94 mg/dL — AB (ref 0.44–1.00)
Calcium: 8.3 mg/dL — ABNORMAL LOW (ref 8.9–10.3)
GFR calc Af Amer: 28 mL/min — ABNORMAL LOW (ref >60)
GFR calc non Af Amer: 24 mL/min — ABNORMAL LOW (ref >60)
Glucose, Bld: 102 mg/dL — ABNORMAL HIGH (ref 65–99)
Potassium: 4.5 mmol/L (ref 3.5–5.1)
SODIUM: 137 mmol/L (ref 135–145)

## 2018-01-25 LAB — MAGNESIUM: MAGNESIUM: 1.7 mg/dL (ref 1.7–2.4)

## 2018-01-25 MED ORDER — LOPERAMIDE HCL 2 MG PO CAPS: 2.0000 mg | ORAL_CAPSULE | ORAL | 0 refills | Status: DC | PRN

## 2018-01-25 MED ORDER — LEVOTHYROXINE SODIUM 75 MCG PO TABS: 75.0000 ug | ORAL_TABLET | Freq: Every day | ORAL | 0 refills | Status: DC

## 2018-01-25 NOTE — Discharge Summary (Addendum)
PATIENT DETAILS Name: [name] Age: 72 y.o. Sex: female Date of Birth: 10/28/1946 MRN: [id number]. Admitting Physician: Reyne Dumas, MD ATF:[id number], Bailey Mech, MD  Admit Date: 01/22/2018 Discharge date: 01/25/2018  Recommendations for Outpatient Follow-up:  1. Follow up with PCP in 1-2 weeks 2. Please obtain BMP/CBC in one week 3. TSH suppressed-decreased levothyroxine to 75 mcg-repeat TSH in 3 months. 4. Please ensure follow up with nephrology 5. Please ensure outpatient follow-up with cardiology-may need a 30-day event monitor  Admitted From:  Home  Disposition: [place]: No  Equipment/Devices: None  Discharge Condition: Stable  CODE STATUS: FULL CODE  Diet recommendation:  Heart Healthy   Brief Summary: See H&P, Labs, Consult and Test reports for all details in brief, Patient is a 72 y.o. female with history of chronic kidney disease stage III, significant hearing impairment status post cochlear implant-presented to the hospital several days history of nausea, vomiting, diarrhea and very poor oral intake.  She was found to have acute kidney injury and admitted to the hospitalist service.  See below for further details  Brief Hospital Course: Acute kidney injury on chronic kidney disease stage III: Acute kidney injury likely hemodynamically mediated-Markedly improved with hydration.She is stable to be followed in the outpatient setting by her nephrologist.  Avoid nephrotoxic agents.  CT of the abdomen did not show any hydronephrosis.  Nausea/vomiting/diarrhea: Probable viral syndrome,Markedly better with supportive care.  No longer vomiting.  Diarrhea has slowed down significantly-stools are more formed.  GI pathogen panel is negative.  Continue with as needed Imodium for a few more days. continue supportive care-add Phenergan for nausea-since GI pathogen panel negative-we will see if as needed Imodium helps with the diarrhea. As noted above CT of the abdomen  did not show any acute abnormalities.  Non-anion gap metabolic acidosis: Likely secondary to acute kidney injury-improved with IV fluidsShe appears to have chronic metabolic acidosis at baseline-we will resume oral bicarb supplementation on discharge  Hypokalemia: Secondary to GI loss-repleted  Episodes of narrow complex tachycardia likely SVT or atrial tachycardia: continue Metoprolol-she is asymptomatic.  (Case was discussed with cardiology over the phone-who reviewed telemetry strips (Dr. Vic Ripper were to continue with beta-blocker and have patient follow with cardiology in the outpatient setting.  Echocardiogram showed preserved EF.  UTI: Did acknowledge dysuria-continue Rocephin-urine culture positive for E. coli sensitive to Rocephin.  Suspect just needs a total of 3 days of treatment.  Hypothyroidism: TSH suppressed-decreased levothyroxine to 75 mcg-repeat TSH in 3 months.  Hypertension: Stable- continue beta-blocker and amlodipine on discharge. Hold HCTZ until seen by outpatient MD.   Significant hearing impairment-status post cochlear implant.   Procedures/Studies: Echo>> - Left ventricle: The cavity size was normal. Wall thickness was   normal. Systolic function was vigorous. The estimated ejection   fraction was in the range of 65% to 70%. Wall motion was normal;   there were no regional wall motion abnormalities. Doppler   parameters are consistent with abnormal left ventricular   relaxation (grade 1 diastolic dysfunction). The E/e&' ratio is   between 8-15, suggesting indeterminate LV filling pressure. - Mitral valve: Mildly thickened leaflets . There was trivial   regurgitation. - Left atrium: The atrium was normal in size. - Inferior vena cava: The vessel was normal in size. The   respirophasic diameter changes were in the normal range (>= 50%),   consistent with normal central venous pressure.  Discharge Diagnoses:  Principal Problem:   AKI  (acute kidney injury) ([place]) Active  Problems:   CKD (chronic kidney disease) stage 4, GFR 15-29 ml/min (HCC)   Weight loss   Dehydration   UTI (urinary tract infection)   Protein-calorie malnutrition, severe   Discharge Instructions:  Activity:  As tolerated with Full fall precautions use walker/cane & assistance as needed   Discharge Instructions    Call MD for:  difficulty breathing, headache or visual disturbances   Complete by:  As directed    Call MD for:  persistant nausea and vomiting   Complete by:  As directed    Diet - low sodium heart healthy   Complete by:  As directed    Discharge instructions   Complete by:  As directed    Follow with Primary MD  Glendale Chard, MD  and  Dr Justin Mend in 1 week  Please get a complete blood count and chemistry panel checked by your Primary MD at your next visit, and again as instructed by your Primary MD.  Get Medicines reviewed and adjusted: Please take all your medications with you for your next visit with your Primary MD  Laboratory/radiological data: Please request your Primary MD to go over all hospital tests and procedure/radiological results at the follow up, please ask your Primary MD to get all Hospital records sent to his/her office.  In some cases, they will be blood work, cultures and biopsy results pending at the time of your discharge. Please request that your primary care M.D. follows up on these results.  Also Note the following: If you experience worsening of your admission symptoms, develop shortness of breath, life threatening emergency, suicidal or homicidal thoughts you must seek medical attention immediately by calling 911 or calling your MD immediately  if symptoms less severe.  You must read complete instructions/literature along with all the possible adverse reactions/side effects for all the Medicines you take and that have been prescribed to you. Take any new Medicines after you have completely understood and  accpet all the possible adverse reactions/side effects.   Do not drive when taking Pain medications or sleeping medications (Benzodaizepines)  Do not take more than prescribed Pain, Sleep and Anxiety Medications. It is not advisable to combine anxiety,sleep and pain medications without talking with your primary care practitioner  Special Instructions: If you have smoked or chewed Tobacco  in the last 2 yrs please stop smoking, stop any regular Alcohol  and or any Recreational drug use.  Wear Seat belts while driving.  Please note: You were cared for by a hospitalist during your hospital stay. Once you are discharged, your primary care physician will handle any further medical issues. Please note that NO REFILLS for any discharge medications will be authorized once you are discharged, as it is imperative that you return to your primary care physician (or establish a relationship with a primary care physician if you do not have one) for your post hospital discharge needs so that they can reassess your need for medications and monitor your lab values.   Increase activity slowly   Complete by:  As directed      Allergies as of 01/25/2018   No Known Allergies     Medication List    STOP taking these medications   hydrochlorothiazide 12.5 MG tablet Commonly known as:  HYDRODIURIL     TAKE these medications   amLODipine 5 MG tablet Commonly known as:  NORVASC Take 1 tablet (5 mg total) by mouth daily.   levothyroxine 75 MCG tablet Commonly known as:  SYNTHROID, LEVOTHROID  Take 1 tablet (75 mcg total) by mouth daily. What changed:    medication strength  how much to take   loperamide 2 MG capsule Commonly known as:  IMODIUM Take 1 capsule (2 mg total) by mouth as needed for diarrhea or loose stools. Do not take more than 16 mg/day   metoprolol tartrate 25 MG tablet Commonly known as:  LOPRESSOR Take 2 tablets (50 mg total) by mouth 2 (two) times daily.   senna-docusate 8.6-50  MG tablet Commonly known as:  Senokot-S Take 1 tablet by mouth daily.   sodium bicarbonate 650 MG tablet Take 1 tablet (650 mg total) by mouth 3 (three) times daily.   TYLENOL 8 HOUR ARTHRITIS PAIN 650 MG CR tablet Generic drug:  acetaminophen Take 1,950 mg by mouth daily as needed for pain (ARTHRITIS).      Follow-up Information    Glendale Chard, MD. Schedule an appointment as soon as possible for a visit in 1 week(s).   Specialty:  Internal Medicine Contact information: [address] [place] [address] [phone]        Edrick Oh, MD. Schedule an appointment as soon as possible for a visit in 1 week(s).   Specialty:  Nephrology Contact information: [place] [address] [phone]        Jerline Pain, MD. Schedule an appointment as soon as possible for a visit in 2 week(s).   Specialty:  Cardiology Contact information: [address] [place] [address] [phone]          No Known Allergies  Consultations:   None   Other Procedures/Studies: Ct Abdomen Pelvis Wo Contrast  Result Date: 01/22/2018 CLINICAL DATA:  Weakness and confusion.  Loose stools. EXAM: CT ABDOMEN AND PELVIS WITHOUT CONTRAST TECHNIQUE: Multidetector CT imaging of the abdomen and pelvis was performed following the standard protocol without IV contrast. COMPARISON:  None. FINDINGS: Lower chest: Lung bases are clear. Hepatobiliary: Liver is nodular. No IV contrast. Gallbladder is distended to 4.5 cm. No gallbladder inflammation. Common bile duct normal Pancreas: Pancreas is normal. No ductal dilatation. No pancreatic inflammation. Spleen: Normal spleen Adrenals/urinary tract: Adrenal glands and kidneys are normal. The ureters and bladder normal. Stomach/Bowel: Stomach, small bowel, appendix, and cecum are normal. The colon and rectosigmoid colon are normal. Vascular/Lymphatic: Abdominal aorta is normal caliber with atherosclerotic  calcification. There is no retroperitoneal or periportal lymphadenopathy. No pelvic lymphadenopathy. Reproductive: Uterus and ovaries normal Other: No free fluid. Musculoskeletal: No aggressive osseous lesion. IMPRESSION: 1. Nodule liver suggests underlying cirrhosis. 2. Distended gallbladder without evidence acute inflammation. 3. No acute findings in the abdomen or pelvis. 4. Normal appendix. Aortic Atherosclerosis (ICD10-I70.0). Electronically Signed   By: Suzy Bouchard M.D.   On: 01/22/2018 16:10   Dg Chest 2 View  Result Date: 01/22/2018 CLINICAL DATA:  Confusion weakness. EXAM: CHEST  2 VIEW COMPARISON:  CT chest and chest radiograph 03/30/2013. FINDINGS: Trachea is midline. Heart size normal. Thoracic aorta is calcified. Lungs are hyperinflated but clear. No pleural fluid. IMPRESSION: Hyperinflation without acute finding. Electronically Signed   By: Lorin Picket M.D.   On: 01/22/2018 12:34     TODAY-DAY OF DISCHARGE:  Subjective:   [place] Born today has no headache,no chest abdominal pain,no new weakness tingling or numbness, feels much better wants to go home today.   Objective:   Blood pressure (!) 100/59, pulse 72, temperature 98.6 F (37 C), temperature source Oral, resp. rate 16, height $RemoveBe'5\' 1"'jVSBGRkjx$  (1.549  m), weight 43.4 kg (95 lb 9.6 oz), SpO2 99 %.  Intake/Output Summary (Last 24 hours) at 01/25/2018 1051 Last data filed at 01/25/2018 0813 Gross per 24 hour  Intake 540 ml  Output 700 ml  Net -160 ml   Filed Weights   01/22/18 1633  Weight: 43.4 kg (95 lb 9.6 oz)    Exam: Awake Alert, Oriented *3, No new F.N deficits, Normal affect [place].AT,PERRAL Supple Neck,No JVD, No cervical lymphadenopathy appriciated.  Symmetrical Chest wall movement, Good air movement bilaterally, CTAB RRR,No Gallops,Rubs or new Murmurs, No Parasternal Heave +ve B.Sounds, Abd Soft, Non tender, No organomegaly appriciated, No rebound -guarding or rigidity. No Cyanosis, Clubbing or edema, No new Rash  or bruise   PERTINENT RADIOLOGIC STUDIES: Ct Abdomen Pelvis Wo Contrast  Result Date: 01/22/2018 CLINICAL DATA:  Weakness and confusion.  Loose stools. EXAM: CT ABDOMEN AND PELVIS WITHOUT CONTRAST TECHNIQUE: Multidetector CT imaging of the abdomen and pelvis was performed following the standard protocol without IV contrast. COMPARISON:  None. FINDINGS: Lower chest: Lung bases are clear. Hepatobiliary: Liver is nodular. No IV contrast. Gallbladder is distended to 4.5 cm. No gallbladder inflammation. Common bile duct normal Pancreas: Pancreas is normal. No ductal dilatation. No pancreatic inflammation. Spleen: Normal spleen Adrenals/urinary tract: Adrenal glands and kidneys are normal. The ureters and bladder normal. Stomach/Bowel: Stomach, small bowel, appendix, and cecum are normal. The colon and rectosigmoid colon are normal. Vascular/Lymphatic: Abdominal aorta is normal caliber with atherosclerotic calcification. There is no retroperitoneal or periportal lymphadenopathy. No pelvic lymphadenopathy. Reproductive: Uterus and ovaries normal Other: No free fluid. Musculoskeletal: No aggressive osseous lesion. IMPRESSION: 1. Nodule liver suggests underlying cirrhosis. 2. Distended gallbladder without evidence acute inflammation. 3. No acute findings in the abdomen or pelvis. 4. Normal appendix. Aortic Atherosclerosis (ICD10-I70.0). Electronically Signed   By: Suzy Bouchard M.D.   On: 01/22/2018 16:10   Dg Chest 2 View  Result Date: 01/22/2018 CLINICAL DATA:  Confusion weakness. EXAM: CHEST  2 VIEW COMPARISON:  CT chest and chest radiograph 03/30/2013. FINDINGS: Trachea is midline. Heart size normal. Thoracic aorta is calcified. Lungs are hyperinflated but clear. No pleural fluid. IMPRESSION: Hyperinflation without acute finding. Electronically Signed   By: Lorin Picket M.D.   On: 01/22/2018 12:34     PERTINENT LAB RESULTS: CBC: Recent Labs    01/23/18 0618  WBC 6.3  HGB 9.6*  HCT 28.5*  PLT  126*   CMET CMP     Component Value Date/Time   NA 137 01/25/2018 0645   K 4.5 01/25/2018 0645   CL 97 (L) 01/25/2018 0645   CO2 32 01/25/2018 0645   GLUCOSE 102 (H) 01/25/2018 0645   BUN 30 (H) 01/25/2018 0645   CREATININE 1.94 (H) 01/25/2018 0645   CALCIUM 8.3 (L) 01/25/2018 0645   PROT 5.9 (L) 01/23/2018 0618   ALBUMIN 3.5 01/23/2018 0618   AST 36 01/23/2018 0618   ALT 18 01/23/2018 0618   ALKPHOS 64 01/23/2018 0618   BILITOT 0.4 01/23/2018 0618   GFRNONAA 24 (L) 01/25/2018 0645   GFRAA 28 (L) 01/25/2018 0645    GFR Estimated Creatinine Clearance: 17.7 mL/min (A) (by C-G formula based on SCr of 1.94 mg/dL (H)). No results for input(s): LIPASE, AMYLASE in the last 72 hours. No results for input(s): CKTOTAL, CKMB, CKMBINDEX, TROPONINI in the last 72 hours. Invalid input(s): POCBNP No results for input(s): DDIMER in the last 72 hours. No results for input(s): HGBA1C in the last 72 hours. No results for input(s): CHOL,  HDL, LDLCALC, TRIG, CHOLHDL, LDLDIRECT in the last 72 hours. Recent Labs    01/22/18 1528  TSH 0.044*   No results for input(s): VITAMINB12, FOLATE, FERRITIN, TIBC, IRON, RETICCTPCT in the last 72 hours. Coags: No results for input(s): INR in the last 72 hours.  Invalid input(s): PT Microbiology: Recent Results (from the past 240 hour(s))  Urine Culture     Status: Abnormal   Collection Time: 01/22/18  3:29 PM  Result Value Ref Range Status   Specimen Description   Final    URINE, CLEAN CATCH Performed at [facility], [address], [place], [place] [address]    Special Requests   Final    NONE Performed at [facility], [address], [place], [place] [address]    Culture >=100,000 COLONIES/mL ESCHERICHIA COLI (A)  Final   Report Status 01/24/2018 FINAL  Final   Organism ID, Bacteria ESCHERICHIA COLI (A)  Final      Susceptibility   Escherichia coli - MIC*    AMPICILLIN >=32 RESISTANT Resistant      CEFAZOLIN >=64 RESISTANT Resistant     CEFTRIAXONE <=1 SENSITIVE Sensitive     CIPROFLOXACIN <=0.25 SENSITIVE Sensitive     GENTAMICIN <=1 SENSITIVE Sensitive     IMIPENEM <=0.25 SENSITIVE Sensitive     NITROFURANTOIN <=16 SENSITIVE Sensitive     TRIMETH/SULFA <=20 SENSITIVE Sensitive     AMPICILLIN/SULBACTAM >=32 RESISTANT Resistant     PIP/TAZO 8 SENSITIVE Sensitive     Extended ESBL NEGATIVE Sensitive     * >=100,000 COLONIES/mL ESCHERICHIA COLI  Gastrointestinal Panel by PCR , Stool     Status: None   Collection Time: 01/23/18 12:42 AM  Result Value Ref Range Status   Campylobacter species NOT DETECTED NOT DETECTED Final   Plesimonas shigelloides NOT DETECTED NOT DETECTED Final   Salmonella species NOT DETECTED NOT DETECTED Final   Yersinia enterocolitica NOT DETECTED NOT DETECTED Final   Vibrio species NOT DETECTED NOT DETECTED Final   Vibrio cholerae NOT DETECTED NOT DETECTED Final   Enteroaggregative E coli (EAEC) NOT DETECTED NOT DETECTED Final   Enteropathogenic E coli (EPEC) NOT DETECTED NOT DETECTED Final   Enterotoxigenic E coli (ETEC) NOT DETECTED NOT DETECTED Final   Shiga like toxin producing E coli (STEC) NOT DETECTED NOT DETECTED Final   Shigella/Enteroinvasive E coli (EIEC) NOT DETECTED NOT DETECTED Final   Cryptosporidium NOT DETECTED NOT DETECTED Final   Cyclospora cayetanensis NOT DETECTED NOT DETECTED Final   Entamoeba histolytica NOT DETECTED NOT DETECTED Final   Giardia lamblia NOT DETECTED NOT DETECTED Final   Adenovirus F40/41 NOT DETECTED NOT DETECTED Final   Astrovirus NOT DETECTED NOT DETECTED Final   Norovirus GI/GII NOT DETECTED NOT DETECTED Final   Rotavirus A NOT DETECTED NOT DETECTED Final   Sapovirus (I, II, IV, and V) NOT DETECTED NOT DETECTED Final    Comment: Performed at [facility], [address] [place], [place] [address]    FURTHER DISCHARGE INSTRUCTIONS:  Get Medicines reviewed and adjusted: Please take all your  medications with you for your next visit with your Primary MD  Laboratory/radiological data: Please request your Primary MD to go over all hospital tests and procedure/radiological results at the follow up, please ask your Primary MD to get all Hospital records sent to his/her office.  In some cases, they will be blood work, cultures and biopsy results pending at the time of your discharge. Please request that your primary care M.D. goes  through all the records of your hospital data and follows up on these results.  Also Note the following: If you experience worsening of your admission symptoms, develop shortness of breath, life threatening emergency, suicidal or homicidal thoughts you must seek medical attention immediately by calling 911 or calling your MD immediately  if symptoms less severe.  You must read complete instructions/literature along with all the possible adverse reactions/side effects for all the Medicines you take and that have been prescribed to you. Take any new Medicines after you have completely understood and accpet all the possible adverse reactions/side effects.   Do not drive when taking Pain medications or sleeping medications (Benzodaizepines)  Do not take more than prescribed Pain, Sleep and Anxiety Medications. It is not advisable to combine anxiety,sleep and pain medications without talking with your primary care practitioner  Special Instructions: If you have smoked or chewed Tobacco  in the last 2 yrs please stop smoking, stop any regular Alcohol  and or any Recreational drug use.  Wear Seat belts while driving.  Please note: You were cared for by a hospitalist during your hospital stay. Once you are discharged, your primary care physician will handle any further medical issues. Please note that NO REFILLS for any discharge medications will be authorized once you are discharged, as it is imperative that you return to your primary care physician (or establish a  relationship with a primary care physician if you do not have one) for your post hospital discharge needs so that they can reassess your need for medications and monitor your lab values.  Total Time spent coordinating discharge including counseling, education and face to face time equals 45 minutes.  SignedOren Binet 01/25/2018 10:51 AM

## 2018-01-25 NOTE — Progress Notes (Signed)
[name] Born to be D/C'd Home per MD order.  Discussed prescriptions and follow up appointments with the patient. Prescriptions given to patient, medication list explained in detail. Pt verbalized understanding.  Allergies as of 01/25/2018   No Known Allergies     Medication List    STOP taking these medications   hydrochlorothiazide 12.5 MG tablet Commonly known as:  HYDRODIURIL     TAKE these medications   amLODipine 5 MG tablet Commonly known as:  NORVASC Take 1 tablet (5 mg total) by mouth daily.   levothyroxine 75 MCG tablet Commonly known as:  SYNTHROID, LEVOTHROID Take 1 tablet (75 mcg total) by mouth daily. What changed:    medication strength  how much to take   loperamide 2 MG capsule Commonly known as:  IMODIUM Take 1 capsule (2 mg total) by mouth as needed for diarrhea or loose stools. Do not take more than 16 mg/day   metoprolol tartrate 25 MG tablet Commonly known as:  LOPRESSOR Take 2 tablets (50 mg total) by mouth 2 (two) times daily.   senna-docusate 8.6-50 MG tablet Commonly known as:  Senokot-S Take 1 tablet by mouth daily.   sodium bicarbonate 650 MG tablet Take 1 tablet (650 mg total) by mouth 3 (three) times daily.   TYLENOL 8 HOUR ARTHRITIS PAIN 650 MG CR tablet Generic drug:  acetaminophen Take 1,950 mg by mouth daily as needed for pain (ARTHRITIS).       Vitals:   01/25/18 0526 01/25/18 0900  BP: 102/77 (!) 100/59  Pulse: 78 72  Resp: 17 16  Temp: 98.3 F (36.8 C) 98.6 F (37 C)  SpO2: 100% 99%    Skin clean, dry and intact without evidence of skin break down, no evidence of skin tears noted. IV catheter discontinued intact. Site without signs and symptoms of complications. Dressing and pressure applied. Pt denies pain at this time. No complaints noted.  An After Visit Summary was printed and given to the patient. Patient escorted via [place], and D/C home via private auto.  [name] BSN, RN [employer] Phone [phone]

## 2018-02-03 DIAGNOSIS — R000 Tachycardia, unspecified: Secondary | ICD-10-CM | POA: Diagnosis not present

## 2018-02-03 DIAGNOSIS — N390 Urinary tract infection, site not specified: Secondary | ICD-10-CM | POA: Diagnosis not present

## 2018-02-03 DIAGNOSIS — R609 Edema, unspecified: Secondary | ICD-10-CM | POA: Diagnosis not present

## 2018-02-03 DIAGNOSIS — E860 Dehydration: Secondary | ICD-10-CM | POA: Diagnosis not present

## 2018-02-26 DIAGNOSIS — I471 Supraventricular tachycardia: Secondary | ICD-10-CM | POA: Insufficient documentation

## 2018-02-26 NOTE — Progress Notes (Signed)
Cardiology Office Note    Date:  02/27/2018   ID:  [name], DOB 05-31-1946, MRN [id number]  PCP:  Glendale Chard, MD  Cardiologist: Sinclair Grooms, MD   Chief Complaint  Patient presents with  . Irregular Heart Beat    History of Present Illness:  [name] is a 72 y.o. female not previously seen by cardiology who had an episode of narrow complex tachycardia during the hospitalization for urosepsis.  The case was discussed with Dr.Koneswaran who recommended that the patient be started on beta-blocker therapy and follow-up with cardiology as an outpatient.  The evaluation was set up by Dr. Oren Binet.  She is doing well.  She is status post cochlear implant.  She was deaf in the implant in the left ear has improved hearing.  While hospitalized in January she was noted to have narrow complex tachycardia at rates in the 170-180 B per minute range.  Beta-blocker therapy was started in addition to her chronic treatment with amlodipine.  She is currently on 50 mg twice daily.  When in the tachycardia she was unaware of any arrhythmia and had no complaints.  She feels she is doing well.  She denies orthopnea and PND.  No peripheral edema.  No chest pain or unexplained dyspnea.  Past Medical History:  Diagnosis Date  . Hypertension   . Liver disease   . Renal disorder   . Thyroid disease     Past Surgical History:  Procedure Laterality Date  . COCHLEAR IMPLANT    . KNEE SURGERY      Current Medications: Outpatient Medications Prior to Visit  Medication Sig Dispense Refill  . acetaminophen (TYLENOL 8 HOUR ARTHRITIS PAIN) 650 MG CR tablet Take 1,950 mg by mouth daily as needed for pain (ARTHRITIS).    Marland Kitchen amLODipine (NORVASC) 5 MG tablet Take 1 tablet (5 mg total) by mouth daily. 30 tablet 0  . levothyroxine (SYNTHROID, LEVOTHROID) 75 MCG tablet Take 1 tablet (75 mcg total) by mouth daily. 30 tablet 0  . loperamide (IMODIUM) 2 MG capsule Take 1 capsule (2 mg total) by  mouth as needed for diarrhea or loose stools. Do not take more than 16 mg/day 15 capsule 0  . senna-docusate (SENOKOT-S) 8.6-50 MG per tablet Take 1 tablet by mouth daily. 7 tablet 0  . sodium bicarbonate 650 MG tablet Take 1 tablet (650 mg total) by mouth 3 (three) times daily. 60 tablet 0  . metoprolol tartrate (LOPRESSOR) 25 MG tablet Take 2 tablets (50 mg total) by mouth 2 (two) times daily. 60 tablet 0   No facility-administered medications prior to visit.      Allergies:   Patient has no known allergies.   Social History   Socioeconomic History  . Marital status: Married    Spouse name: None  . Number of children: None  . Years of education: None  . Highest education level: None  Social Needs  . Financial resource strain: None  . Food insecurity - worry: None  . Food insecurity - inability: None  . Transportation needs - medical: None  . Transportation needs - non-medical: None  Occupational History  . None  Tobacco Use  . Smoking status: Former Research scientist (life sciences)  . Smokeless tobacco: Never Used  Substance and Sexual Activity  . Alcohol use: No  . Drug use: No  . Sexual activity: None  Other Topics Concern  . None  Social History Narrative  . None     Family History:  The patient's family history includes Aneurysm in her father; Cervical cancer in her mother; Hypertension in her other.   ROS:   Please see the history of present illness.    Decreased hearing.  Otherwise no complaints.  One sister died of COPD at age 15. All other systems reviewed and are negative.   PHYSICAL EXAM:   VS:  BP 140/86   Pulse 61   Ht $R'5\' 4"'yu$  (1.626 m)   Wt 99 lb 6.4 oz (45.1 kg)   BMI 17.06 kg/m    GEN: Well nourished, well developed, in no acute distress  HEENT: normal  Neck: no JVD, carotid bruits, or masses Cardiac: RRR; no murmurs, rubs, or gallops,no edema  Respiratory:  clear to auscultation bilaterally, normal work of breathing GI: soft, nontender, nondistended, + BS MS: no  deformity or atrophy  Skin: warm and dry, no rash Neuro:  Alert and Oriented x 3, Strength and sensation are intact Psych: euthymic mood, full affect  Wt Readings from Last 3 Encounters:  02/27/18 99 lb 6.4 oz (45.1 kg)  01/22/18 95 lb 9.6 oz (43.4 kg)  03/30/13 114 lb 13.8 oz (52.1 kg)      Studies/Labs Reviewed:   EKG:  EKG electrocardiogram performed January 22, 2018 demonstrates short PR interval, and LVH.  Recent Labs: 01/22/2018: TSH 0.044 01/23/2018: ALT 18; Hemoglobin 9.6; Platelets 126 01/25/2018: BUN 30; Creatinine, Ser 1.94; Magnesium 1.7; Potassium 4.5; Sodium 137   Lipid Panel No results found for: CHOL, TRIG, HDL, CHOLHDL, VLDL, LDLCALC, LDLDIRECT  Additional studies/ records that were reviewed today include:  2D Doppler echocardiogram performed on 01/24/18: Study Conclusions   - Left ventricle: The cavity size was normal. Wall thickness was   normal. Systolic function was vigorous. The estimated ejection   fraction was in the range of 65% to 70%. Wall motion was normal;   there were no regional wall motion abnormalities. Doppler   parameters are consistent with abnormal left ventricular   relaxation (grade 1 diastolic dysfunction). The E/e&' ratio is   between 8-15, suggesting indeterminate LV filling pressure. - Mitral valve: Mildly thickened leaflets . There was trivial   regurgitation. - Left atrium: The atrium was normal in size. - Inferior vena cava: The vessel was normal in size. The   respirophasic diameter changes were in the normal range (>= 50%),   consistent with normal central venous pressure.   Impressions:   - LVEF 65-70%, normal wall thickness, normal wall motion, grade 1   DD, indeterminate LV filling pressure, trivial MR, normal LA   Size, normal IVC.    ASSESSMENT:    1. Narrow complex tachycardia ([place])   2. Essential hypertension   3. Shortened PR interval   4. CKD (chronic kidney disease) stage 4, GFR 15-29 ml/min (HCC)   5. Tobacco  abuse   6. Left renal mass   7. Liver disease/? cirrhosis      PLAN:  In order of problems listed above:  1. 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  SVT was self terminating and asymptomatic.  She is now on metoprolol 50 mg twice daily (tartrate).  Will switch to metoprolol succinate 50 mg/day.  48-hour Holter monitor to assess for asymptomatic tachycardia. 2. Blood pressure control is borderline.  Target is 130/80 mmHg.  Low-salt diet is discussed. 3. 48-hour Holter to discover if there is asymptomatic tachycardia  1 month follow-up with APP to review Holter results and we should  have an EKG performed on that visit to reassess PR interval on definitive beta-blocker therapy.  May not need routine follow-up unless symptomatic arrhythmia.  Blood pressure recheck to ensure that we have not overdosed the beta-blocker.  Target blood pressure 130/80 mmHg.    Medication Adjustments/Labs and Tests Ordered: Current medicines are reviewed at length with the patient today.  Concerns regarding medicines are outlined above.  Medication changes, Labs and Tests ordered today are listed in the Patient Instructions below. Patient Instructions  Medication Instructions:  1) DISCONTINUE Metoprolol Tartrate once you run out of this medication, then 2) START Metoprolol Succinate 100mg  once daily  Labwork: None  Testing/Procedures: Your physician has recommended that you wear a 48 hour holter monitor. Holter monitors are medical devices that record the heart's electrical activity. Doctors most often use these monitors to diagnose arrhythmias. Arrhythmias are problems with the speed or rhythm of the heartbeat. The monitor is a small, portable device. You can wear one while you do your normal daily activities. This is usually used to diagnose what is causing palpitations/syncope (passing out).    Follow-Up: Your physician recommends that you schedule a  follow-up appointment in: 1 month with a PA or NP.  Your physician recommends that you schedule a follow-up appointment as needed with Dr. Tamala Julian.     Any Other Special Instructions Will Be Listed Below (If Applicable).     If you need a refill on your cardiac medications before your next appointment, please call your pharmacy.      Signed, Sinclair Grooms, MD  02/27/2018 1:46 PM    [facility], [place], [place]  [address] Phone: [phone]; Fax: [phone]

## 2018-02-27 ENCOUNTER — Ambulatory Visit: Payer: Medicare Other | Admitting: Interventional Cardiology

## 2018-02-27 ENCOUNTER — Encounter: Payer: Self-pay | Admitting: Interventional Cardiology

## 2018-02-27 VITALS — BP 140/86 | HR 61 | Ht 64.0 in | Wt 99.4 lb

## 2018-02-27 DIAGNOSIS — Z720 Tobacco use: Secondary | ICD-10-CM

## 2018-02-27 DIAGNOSIS — K769 Liver disease, unspecified: Secondary | ICD-10-CM | POA: Diagnosis not present

## 2018-02-27 DIAGNOSIS — I10 Essential (primary) hypertension: Secondary | ICD-10-CM

## 2018-02-27 DIAGNOSIS — I471 Supraventricular tachycardia: Secondary | ICD-10-CM

## 2018-02-27 DIAGNOSIS — N184 Chronic kidney disease, stage 4 (severe): Secondary | ICD-10-CM | POA: Diagnosis not present

## 2018-02-27 DIAGNOSIS — R9431 Abnormal electrocardiogram [ECG] [EKG]: Secondary | ICD-10-CM

## 2018-02-27 DIAGNOSIS — N2889 Other specified disorders of kidney and ureter: Secondary | ICD-10-CM

## 2018-02-27 MED ORDER — METOPROLOL SUCCINATE ER 100 MG PO TB24: 100.0000 mg | ORAL_TABLET | Freq: Every day | ORAL | 3 refills | Status: DC

## 2018-02-27 NOTE — Patient Instructions (Signed)
Medication Instructions:  1) DISCONTINUE Metoprolol Tartrate once you run out of this medication, then 2) START Metoprolol Succinate 100mg  once daily  Labwork: None  Testing/Procedures: Your physician has recommended that you wear a 48 hour holter monitor. Holter monitors are medical devices that record the heart's electrical activity. Doctors most often use these monitors to diagnose arrhythmias. Arrhythmias are problems with the speed or rhythm of the heartbeat. The monitor is a small, portable device. You can wear one while you do your normal daily activities. This is usually used to diagnose what is causing palpitations/syncope (passing out).    Follow-Up: Your physician recommends that you schedule a follow-up appointment in: 1 month with a PA or NP.  Your physician recommends that you schedule a follow-up appointment as needed with Dr. Tamala Julian.     Any Other Special Instructions Will Be Listed Below (If Applicable).     If you need a refill on your cardiac medications before your next appointment, please call your pharmacy.

## 2018-03-05 DIAGNOSIS — N2581 Secondary hyperparathyroidism of renal origin: Secondary | ICD-10-CM | POA: Diagnosis not present

## 2018-03-05 DIAGNOSIS — N179 Acute kidney failure, unspecified: Secondary | ICD-10-CM | POA: Diagnosis not present

## 2018-03-05 DIAGNOSIS — D631 Anemia in chronic kidney disease: Secondary | ICD-10-CM | POA: Diagnosis not present

## 2018-03-10 DIAGNOSIS — N184 Chronic kidney disease, stage 4 (severe): Secondary | ICD-10-CM | POA: Diagnosis not present

## 2018-03-13 DIAGNOSIS — E039 Hypothyroidism, unspecified: Secondary | ICD-10-CM | POA: Diagnosis not present

## 2018-03-13 DIAGNOSIS — N184 Chronic kidney disease, stage 4 (severe): Secondary | ICD-10-CM | POA: Diagnosis not present

## 2018-03-13 DIAGNOSIS — I129 Hypertensive chronic kidney disease with stage 1 through stage 4 chronic kidney disease, or unspecified chronic kidney disease: Secondary | ICD-10-CM | POA: Diagnosis not present

## 2018-03-13 DIAGNOSIS — R000 Tachycardia, unspecified: Secondary | ICD-10-CM | POA: Diagnosis not present

## 2018-03-14 ENCOUNTER — Ambulatory Visit (INDEPENDENT_AMBULATORY_CARE_PROVIDER_SITE_OTHER): Payer: Medicare Other

## 2018-03-14 DIAGNOSIS — I471 Supraventricular tachycardia: Secondary | ICD-10-CM

## 2018-03-28 ENCOUNTER — Telehealth: Payer: Self-pay | Admitting: Interventional Cardiology

## 2018-03-28 NOTE — Telephone Encounter (Signed)
With patient permission, informed patient's daughter of results.  Cancelled follow-up visit next week and patient understands to call if she has any symptoms at all. She was grateful for call and agrees with treatment plan.

## 2018-03-28 NOTE — Telephone Encounter (Signed)
[name] is returning a call . Thanks

## 2018-04-01 ENCOUNTER — Ambulatory Visit: Payer: Medicare Other | Admitting: Physician Assistant

## 2018-04-01 DIAGNOSIS — N179 Acute kidney failure, unspecified: Secondary | ICD-10-CM | POA: Diagnosis not present

## 2018-04-01 DIAGNOSIS — N2581 Secondary hyperparathyroidism of renal origin: Secondary | ICD-10-CM | POA: Diagnosis not present

## 2018-04-01 DIAGNOSIS — D631 Anemia in chronic kidney disease: Secondary | ICD-10-CM | POA: Diagnosis not present

## 2018-04-18 DIAGNOSIS — H903 Sensorineural hearing loss, bilateral: Secondary | ICD-10-CM | POA: Diagnosis not present

## 2018-04-23 ENCOUNTER — Other Ambulatory Visit (HOSPITAL_COMMUNITY): Payer: Self-pay | Admitting: *Deleted

## 2018-04-24 ENCOUNTER — Ambulatory Visit (HOSPITAL_COMMUNITY)
Admission: RE | Admit: 2018-04-24 | Discharge: 2018-04-24 | Disposition: A | Discharge status: Home / Self Care | Payer: Medicare Other | Source: Ambulatory Visit | Attending: Nephrology | Admitting: Nephrology

## 2018-04-24 VITALS — BP 163/63 | HR 55 | Temp 97.7°F | Resp 20

## 2018-04-24 DIAGNOSIS — Z5181 Encounter for therapeutic drug level monitoring: Secondary | ICD-10-CM | POA: Diagnosis not present

## 2018-04-24 DIAGNOSIS — N184 Chronic kidney disease, stage 4 (severe): Secondary | ICD-10-CM | POA: Diagnosis not present

## 2018-04-24 DIAGNOSIS — D631 Anemia in chronic kidney disease: Secondary | ICD-10-CM | POA: Diagnosis not present

## 2018-04-24 DIAGNOSIS — Z79899 Other long term (current) drug therapy: Secondary | ICD-10-CM | POA: Diagnosis not present

## 2018-04-24 LAB — POCT HEMOGLOBIN-HEMACUE: Hemoglobin: 10.2 g/dL — ABNORMAL LOW (ref 12.0–15.0)

## 2018-04-24 MED ORDER — DARBEPOETIN ALFA 40 MCG/0.4ML IJ SOSY
PREFILLED_SYRINGE | INTRAMUSCULAR | Status: AC
  Administered 2018-04-24: 40 ug
  Filled 2018-04-24: qty 0.4

## 2018-04-24 MED ORDER — DARBEPOETIN ALFA 40 MCG/0.4ML IJ SOSY: 40.0000 ug | PREFILLED_SYRINGE | INTRAMUSCULAR | Status: DC

## 2018-04-24 NOTE — Discharge Instructions (Signed)

## 2018-05-01 ENCOUNTER — Encounter (HOSPITAL_COMMUNITY): Payer: Medicare Other

## 2018-05-07 ENCOUNTER — Encounter (HOSPITAL_COMMUNITY): Payer: Medicare Other

## 2018-05-08 ENCOUNTER — Ambulatory Visit (HOSPITAL_COMMUNITY)
Admission: RE | Admit: 2018-05-08 | Discharge: 2018-05-08 | Disposition: A | Discharge status: Home / Self Care | Payer: Medicare Other | Source: Ambulatory Visit | Attending: Nephrology | Admitting: Nephrology

## 2018-05-08 VITALS — BP 167/66 | HR 52 | Temp 98.2°F | Resp 20

## 2018-05-08 DIAGNOSIS — N184 Chronic kidney disease, stage 4 (severe): Secondary | ICD-10-CM | POA: Diagnosis not present

## 2018-05-08 DIAGNOSIS — D631 Anemia in chronic kidney disease: Secondary | ICD-10-CM | POA: Insufficient documentation

## 2018-05-08 LAB — POCT HEMOGLOBIN-HEMACUE: HEMOGLOBIN: 10.8 g/dL — AB (ref 12.0–15.0)

## 2018-05-08 MED ORDER — CLONIDINE HCL 0.1 MG PO TABS: 0.1000 mg | ORAL_TABLET | ORAL | Status: DC | PRN

## 2018-05-08 MED ORDER — DARBEPOETIN ALFA 40 MCG/0.4ML IJ SOSY
40.0000 ug | PREFILLED_SYRINGE | INTRAMUSCULAR | Status: DC
  Administered 2018-05-08: 40 ug via SUBCUTANEOUS

## 2018-05-08 MED ORDER — DARBEPOETIN ALFA 40 MCG/0.4ML IJ SOSY
PREFILLED_SYRINGE | INTRAMUSCULAR | Status: AC
  Administered 2018-05-08: 40 ug via SUBCUTANEOUS
  Filled 2018-05-08: qty 0.4

## 2018-05-14 ENCOUNTER — Other Ambulatory Visit (HOSPITAL_COMMUNITY): Payer: Self-pay | Admitting: *Deleted

## 2018-05-14 NOTE — Discharge Instructions (Signed)
Darbepoetin Alfa injection °What is this medicine? °DARBEPOETIN ALFA (dar be POE e tin AL fa) helps your body make more red blood cells. It is used to treat anemia caused by chronic kidney failure and chemotherapy. °This medicine may be used for other purposes; ask your health care provider or pharmacist if you have questions. °COMMON BRAND NAME(S): Aranesp °What should I tell my health care provider before I take this medicine? °They need to know if you have any of these conditions: °-blood clotting disorders or history of blood clots °-cancer patient not on chemotherapy °-cystic fibrosis °-heart disease, such as angina, heart failure, or a history of a heart attack °-hemoglobin level of 12 g/dL or greater °-high blood pressure °-low levels of folate, iron, or vitamin B12 °-seizures °-an unusual or allergic reaction to darbepoetin, erythropoietin, albumin, hamster proteins, latex, other medicines, foods, dyes, or preservatives °-pregnant or trying to get pregnant °-breast-feeding °How should I use this medicine? °This medicine is for injection into a vein or under the skin. It is usually given by a health care professional in a hospital or clinic setting. °If you get this medicine at home, you will be taught how to prepare and give this medicine. Use exactly as directed. Take your medicine at regular intervals. Do not take your medicine more often than directed. °It is important that you put your used needles and syringes in a special sharps container. Do not put them in a trash can. If you do not have a sharps container, call your pharmacist or healthcare provider to get one. °A special MedGuide will be given to you by the pharmacist with each prescription and refill. Be sure to read this information carefully each time. °Talk to your pediatrician regarding the use of this medicine in children. While this medicine may be used in children as young as 1 year for selected conditions, precautions do  apply. °Overdosage: If you think you have taken too much of this medicine contact a poison control center or emergency room at once. °NOTE: This medicine is only for you. Do not share this medicine with others. °What if I miss a dose? °If you miss a dose, take it as soon as you can. If it is almost time for your next dose, take only that dose. Do not take double or extra doses. °What may interact with this medicine? °Do not take this medicine with any of the following medications: °-epoetin alfa °This list may not describe all possible interactions. Give your health care provider a list of all the medicines, herbs, non-prescription drugs, or dietary supplements you use. Also tell them if you smoke, drink alcohol, or use illegal drugs. Some items may interact with your medicine. °What should I watch for while using this medicine? °Your condition will be monitored carefully while you are receiving this medicine. °You may need blood work done while you are taking this medicine. °What side effects may I notice from receiving this medicine? °Side effects that you should report to your doctor or health care professional as soon as possible: °-allergic reactions like skin rash, itching or hives, swelling of the face, lips, or tongue °-breathing problems °-changes in vision °-chest pain °-confusion, trouble speaking or understanding °-feeling faint or lightheaded, falls °-high blood pressure °-muscle aches or pains °-pain, swelling, warmth in the leg °-rapid weight gain °-severe headaches °-sudden numbness or weakness of the face, arm or leg °-trouble walking, dizziness, loss of balance or coordination °-seizures (convulsions) °-swelling of the ankles, feet, hands °-  unusually weak or tired °Side effects that usually do not require medical attention (report to your doctor or health care professional if they continue or are bothersome): °-diarrhea °-fever, chills (flu-like symptoms) °-headaches °-nausea, vomiting °-redness,  stinging, or swelling at site where injected °This list may not describe all possible side effects. Call your doctor for medical advice about side effects. You may report side effects to FDA at 1-800-FDA-1088. °Where should I keep my medicine? °Keep out of the reach of children. °Store in a refrigerator between 2 and 8 degrees C (36 and 46 degrees F). Do not freeze. Do not shake. Throw away any unused portion if using a single-dose vial. Throw away any unused medicine after the expiration date. °NOTE: This sheet is a summary. It may not cover all possible information. If you have questions about this medicine, talk to your doctor, pharmacist, or health care provider. °© 2018 [employer] (2016-07-30 19:52:26) °Ferumoxytol injection °What is this medicine? °FERUMOXYTOL is an iron complex. Iron is used to make healthy red blood cells, which carry oxygen and nutrients throughout the body. This medicine is used to treat iron deficiency anemia in people with chronic kidney disease. °This medicine may be used for other purposes; ask your health care provider or pharmacist if you have questions. °COMMON BRAND NAME(S): Feraheme °What should I tell my health care provider before I take this medicine? °They need to know if you have any of these conditions: °-anemia not caused by low iron levels °-high levels of iron in the blood °-magnetic resonance imaging (MRI) test scheduled °-an unusual or allergic reaction to iron, other medicines, foods, dyes, or preservatives °-pregnant or trying to get pregnant °-breast-feeding °How should I use this medicine? °This medicine is for injection into a vein. It is given by a health care professional in a hospital or clinic setting. °Talk to your pediatrician regarding the use of this medicine in children. Special care may be needed. °Overdosage: If you think you have taken too much of this medicine contact a poison control center or emergency room at once. °NOTE: This medicine  is only for you. Do not share this medicine with others. °What if I miss a dose? °It is important not to miss your dose. Call your doctor or health care professional if you are unable to keep an appointment. °What may interact with this medicine? °This medicine may interact with the following medications: °-other iron products °This list may not describe all possible interactions. Give your health care provider a list of all the medicines, herbs, non-prescription drugs, or dietary supplements you use. Also tell them if you smoke, drink alcohol, or use illegal drugs. Some items may interact with your medicine. °What should I watch for while using this medicine? °Visit your doctor or healthcare professional regularly. Tell your doctor or healthcare professional if your symptoms do not start to get better or if they get worse. You may need blood work done while you are taking this medicine. °You may need to follow a special diet. Talk to your doctor. Foods that contain iron include: whole grains/cereals, dried fruits, beans, or peas, leafy green vegetables, and organ meats (liver, kidney). °What side effects may I notice from receiving this medicine? °Side effects that you should report to your doctor or health care professional as soon as possible: °-allergic reactions like skin rash, itching or hives, swelling of the face, lips, or tongue °-breathing problems °-changes in blood pressure °-feeling faint or lightheaded, falls °-fever or chills °-flushing,   sweating, or hot feelings °-swelling of the ankles or feet °Side effects that usually do not require medical attention (report to your doctor or health care professional if they continue or are bothersome): °-diarrhea °-headache °-nausea, vomiting °-stomach pain °This list may not describe all possible side effects. Call your doctor for medical advice about side effects. You may report side effects to FDA at 1-800-FDA-1088. °Where should I keep my medicine? °This drug  is given in a hospital or clinic and will not be stored at home. °NOTE: This sheet is a summary. It may not cover all possible information. If you have questions about this medicine, talk to your doctor, pharmacist, or health care provider. °© 2018 [employer] Standard (2016-01-12 12:41:49) ° °

## 2018-05-15 ENCOUNTER — Inpatient Hospital Stay (HOSPITAL_COMMUNITY)
Admission: RE | Admit: 2018-05-15 | Discharge: 2018-05-15 | Disposition: A | Discharge status: Home / Self Care | Payer: Medicare Other | Source: Ambulatory Visit | Attending: Nephrology | Admitting: Nephrology

## 2018-05-15 ENCOUNTER — Encounter (HOSPITAL_COMMUNITY): Payer: Self-pay

## 2018-05-22 ENCOUNTER — Encounter (HOSPITAL_COMMUNITY): Payer: Medicare Other

## 2018-05-23 ENCOUNTER — Ambulatory Visit (HOSPITAL_COMMUNITY)
Admission: RE | Admit: 2018-05-23 | Discharge: 2018-05-23 | Disposition: A | Discharge status: Home / Self Care | Payer: Medicare Other | Source: Ambulatory Visit | Attending: Nephrology | Admitting: Nephrology

## 2018-05-23 ENCOUNTER — Encounter (HOSPITAL_COMMUNITY): Payer: Medicare Other

## 2018-05-23 VITALS — BP 139/55 | HR 50 | Temp 97.7°F | Resp 16

## 2018-05-23 DIAGNOSIS — N184 Chronic kidney disease, stage 4 (severe): Secondary | ICD-10-CM | POA: Diagnosis not present

## 2018-05-23 DIAGNOSIS — D631 Anemia in chronic kidney disease: Secondary | ICD-10-CM | POA: Diagnosis not present

## 2018-05-23 LAB — POCT HEMOGLOBIN-HEMACUE: Hemoglobin: 10.8 g/dL — ABNORMAL LOW (ref 12.0–15.0)

## 2018-05-23 MED ORDER — DARBEPOETIN ALFA 100 MCG/0.5ML IJ SOSY: 100.0000 ug | PREFILLED_SYRINGE | INTRAMUSCULAR | Status: DC

## 2018-05-23 MED ORDER — DARBEPOETIN ALFA 40 MCG/0.4ML IJ SOSY
PREFILLED_SYRINGE | INTRAMUSCULAR | Status: AC
  Administered 2018-05-23: 40 ug
  Filled 2018-05-23: qty 0.4

## 2018-05-29 ENCOUNTER — Other Ambulatory Visit (HOSPITAL_COMMUNITY): Payer: Self-pay | Admitting: *Deleted

## 2018-05-30 ENCOUNTER — Ambulatory Visit (HOSPITAL_COMMUNITY)
Admission: RE | Admit: 2018-05-30 | Discharge: 2018-05-30 | Disposition: A | Discharge status: Home / Self Care | Payer: Medicare Other | Source: Ambulatory Visit | Attending: Nephrology | Admitting: Nephrology

## 2018-05-30 VITALS — BP 151/60 | HR 52 | Temp 97.7°F | Resp 20

## 2018-05-30 DIAGNOSIS — D631 Anemia in chronic kidney disease: Secondary | ICD-10-CM | POA: Diagnosis not present

## 2018-05-30 DIAGNOSIS — N184 Chronic kidney disease, stage 4 (severe): Secondary | ICD-10-CM | POA: Diagnosis not present

## 2018-05-30 LAB — IRON AND TIBC
IRON: 42 ug/dL (ref 28–170)
Saturation Ratios: 12 % (ref 10.4–31.8)
TIBC: 357 ug/dL (ref 250–450)
UIBC: 315 ug/dL

## 2018-05-30 LAB — FERRITIN: FERRITIN: 33 ng/mL (ref 11–307)

## 2018-05-30 LAB — POCT HEMOGLOBIN-HEMACUE: HEMOGLOBIN: 11.2 g/dL — AB (ref 12.0–15.0)

## 2018-05-30 MED ORDER — DARBEPOETIN ALFA 40 MCG/0.4ML IJ SOSY
40.0000 ug | PREFILLED_SYRINGE | INTRAMUSCULAR | Status: DC
  Administered 2018-05-30: 40 ug via SUBCUTANEOUS

## 2018-05-30 MED ORDER — DARBEPOETIN ALFA 40 MCG/0.4ML IJ SOSY
PREFILLED_SYRINGE | INTRAMUSCULAR | Status: AC
  Administered 2018-05-30: 40 ug via SUBCUTANEOUS
  Filled 2018-05-30: qty 0.4

## 2018-06-06 ENCOUNTER — Encounter (HOSPITAL_COMMUNITY)
Admission: RE | Admit: 2018-06-06 | Discharge: 2018-06-06 | Disposition: A | Discharge status: Home / Self Care | Payer: Medicare Other | Source: Ambulatory Visit | Attending: Nephrology | Admitting: Nephrology

## 2018-06-06 VITALS — BP 150/73 | HR 51 | Temp 97.7°F | Resp 20

## 2018-06-06 DIAGNOSIS — D631 Anemia in chronic kidney disease: Secondary | ICD-10-CM | POA: Diagnosis not present

## 2018-06-06 DIAGNOSIS — N184 Chronic kidney disease, stage 4 (severe): Secondary | ICD-10-CM | POA: Diagnosis not present

## 2018-06-06 MED ORDER — DARBEPOETIN ALFA 40 MCG/0.4ML IJ SOSY: 40.0000 ug | PREFILLED_SYRINGE | INTRAMUSCULAR | Status: DC

## 2018-06-06 MED ORDER — DARBEPOETIN ALFA 40 MCG/0.4ML IJ SOSY
PREFILLED_SYRINGE | INTRAMUSCULAR | Status: AC
  Administered 2018-06-06: 40 ug
  Filled 2018-06-06: qty 0.4

## 2018-06-09 LAB — POCT HEMOGLOBIN-HEMACUE: Hemoglobin: 10.5 g/dL — ABNORMAL LOW (ref 12.0–15.0)

## 2018-06-13 ENCOUNTER — Encounter (HOSPITAL_COMMUNITY): Payer: Medicare Other

## 2018-06-13 DIAGNOSIS — E871 Hypo-osmolality and hyponatremia: Secondary | ICD-10-CM | POA: Diagnosis not present

## 2018-06-13 DIAGNOSIS — I10 Essential (primary) hypertension: Secondary | ICD-10-CM | POA: Diagnosis not present

## 2018-06-13 DIAGNOSIS — D631 Anemia in chronic kidney disease: Secondary | ICD-10-CM | POA: Diagnosis not present

## 2018-06-13 DIAGNOSIS — E039 Hypothyroidism, unspecified: Secondary | ICD-10-CM | POA: Diagnosis not present

## 2018-06-17 ENCOUNTER — Encounter (HOSPITAL_COMMUNITY): Payer: Medicare Other

## 2018-06-21 ENCOUNTER — Encounter (HOSPITAL_COMMUNITY): Payer: Self-pay

## 2018-06-21 ENCOUNTER — Emergency Department (HOSPITAL_COMMUNITY): Payer: Medicare Other

## 2018-06-21 ENCOUNTER — Other Ambulatory Visit: Payer: Self-pay

## 2018-06-21 ENCOUNTER — Inpatient Hospital Stay (HOSPITAL_COMMUNITY)
Admission: EM | Admit: 2018-06-21 | Discharge: 2018-06-23 | DRG: 194 | Disposition: A | Discharge status: Home / Self Care | Payer: Medicare Other | Attending: Internal Medicine | Admitting: Internal Medicine

## 2018-06-21 DIAGNOSIS — I10 Essential (primary) hypertension: Secondary | ICD-10-CM | POA: Diagnosis present

## 2018-06-21 DIAGNOSIS — Z87891 Personal history of nicotine dependence: Secondary | ICD-10-CM

## 2018-06-21 DIAGNOSIS — I129 Hypertensive chronic kidney disease with stage 1 through stage 4 chronic kidney disease, or unspecified chronic kidney disease: Secondary | ICD-10-CM | POA: Diagnosis present

## 2018-06-21 DIAGNOSIS — J181 Lobar pneumonia, unspecified organism: Secondary | ICD-10-CM | POA: Diagnosis not present

## 2018-06-21 DIAGNOSIS — N184 Chronic kidney disease, stage 4 (severe): Secondary | ICD-10-CM | POA: Diagnosis not present

## 2018-06-21 DIAGNOSIS — R231 Pallor: Secondary | ICD-10-CM | POA: Diagnosis not present

## 2018-06-21 DIAGNOSIS — J189 Pneumonia, unspecified organism: Secondary | ICD-10-CM | POA: Diagnosis not present

## 2018-06-21 DIAGNOSIS — E871 Hypo-osmolality and hyponatremia: Secondary | ICD-10-CM | POA: Diagnosis present

## 2018-06-21 DIAGNOSIS — R197 Diarrhea, unspecified: Secondary | ICD-10-CM | POA: Diagnosis not present

## 2018-06-21 DIAGNOSIS — Z79890 Hormone replacement therapy (postmenopausal): Secondary | ICD-10-CM

## 2018-06-21 DIAGNOSIS — R55 Syncope and collapse: Secondary | ICD-10-CM | POA: Diagnosis not present

## 2018-06-21 DIAGNOSIS — Z79899 Other long term (current) drug therapy: Secondary | ICD-10-CM | POA: Diagnosis not present

## 2018-06-21 DIAGNOSIS — J440 Chronic obstructive pulmonary disease with acute lower respiratory infection: Secondary | ICD-10-CM | POA: Diagnosis not present

## 2018-06-21 DIAGNOSIS — E872 Acidosis: Secondary | ICD-10-CM | POA: Diagnosis not present

## 2018-06-21 DIAGNOSIS — Z9621 Cochlear implant status: Secondary | ICD-10-CM | POA: Diagnosis not present

## 2018-06-21 DIAGNOSIS — E162 Hypoglycemia, unspecified: Secondary | ICD-10-CM

## 2018-06-21 DIAGNOSIS — E039 Hypothyroidism, unspecified: Secondary | ICD-10-CM | POA: Diagnosis not present

## 2018-06-21 DIAGNOSIS — N179 Acute kidney failure, unspecified: Secondary | ICD-10-CM | POA: Diagnosis present

## 2018-06-21 DIAGNOSIS — E161 Other hypoglycemia: Secondary | ICD-10-CM | POA: Diagnosis not present

## 2018-06-21 DIAGNOSIS — R531 Weakness: Secondary | ICD-10-CM | POA: Diagnosis not present

## 2018-06-21 DIAGNOSIS — R05 Cough: Secondary | ICD-10-CM | POA: Diagnosis not present

## 2018-06-21 LAB — URINALYSIS, ROUTINE W REFLEX MICROSCOPIC
Bilirubin Urine: NEGATIVE
GLUCOSE, UA: NEGATIVE mg/dL
Hgb urine dipstick: NEGATIVE
Ketones, ur: NEGATIVE mg/dL
NITRITE: POSITIVE — AB
PROTEIN: NEGATIVE mg/dL
Specific Gravity, Urine: 1.02 (ref 1.005–1.030)
pH: 5 (ref 5.0–8.0)

## 2018-06-21 LAB — COMPREHENSIVE METABOLIC PANEL
ALBUMIN: 2.5 g/dL — AB (ref 3.5–5.0)
ALT: 46 U/L — AB (ref 0–44)
AST: 67 U/L — AB (ref 15–41)
Alkaline Phosphatase: 101 U/L (ref 38–126)
Anion gap: 11 (ref 5–15)
BUN: 55 mg/dL — ABNORMAL HIGH (ref 8–23)
CHLORIDE: 101 mmol/L (ref 98–111)
CO2: 18 mmol/L — AB (ref 22–32)
CREATININE: 3.89 mg/dL — AB (ref 0.44–1.00)
Calcium: 7.2 mg/dL — ABNORMAL LOW (ref 8.9–10.3)
GFR calc non Af Amer: 10 mL/min — ABNORMAL LOW (ref >60)
GFR, EST AFRICAN AMERICAN: 12 mL/min — AB (ref >60)
Glucose, Bld: 142 mg/dL — ABNORMAL HIGH (ref 70–99)
Potassium: 3.8 mmol/L (ref 3.5–5.1)
Sodium: 130 mmol/L — ABNORMAL LOW (ref 135–145)
Total Bilirubin: 1.1 mg/dL (ref 0.3–1.2)
Total Protein: 5.1 g/dL — ABNORMAL LOW (ref 6.5–8.1)

## 2018-06-21 LAB — CBC WITH DIFFERENTIAL/PLATELET
BASOS PCT: 0 %
Basophils Absolute: 0 10*3/uL (ref 0.0–0.1)
Eosinophils Absolute: 0 10*3/uL (ref 0.0–0.7)
Eosinophils Relative: 0 %
HCT: 33.9 % — ABNORMAL LOW (ref 36.0–46.0)
Hemoglobin: 11.3 g/dL — ABNORMAL LOW (ref 12.0–15.0)
Lymphocytes Relative: 4 %
Lymphs Abs: 0.4 10*3/uL — ABNORMAL LOW (ref 0.7–4.0)
MCH: 35.6 pg — ABNORMAL HIGH (ref 26.0–34.0)
MCHC: 33.3 g/dL (ref 30.0–36.0)
MCV: 106.9 fL — AB (ref 78.0–100.0)
MONO ABS: 0.6 10*3/uL (ref 0.1–1.0)
Monocytes Relative: 7 %
NEUTROS PCT: 89 %
Neutro Abs: 8.2 10*3/uL — ABNORMAL HIGH (ref 1.7–7.7)
Platelets: UNDETERMINED 10*3/uL (ref 150–400)
RBC: 3.17 MIL/uL — ABNORMAL LOW (ref 3.87–5.11)
RDW: 15.6 % — ABNORMAL HIGH (ref 11.5–15.5)
WBC: 9.2 10*3/uL (ref 4.0–10.5)

## 2018-06-21 LAB — I-STAT TROPONIN, ED: Troponin i, poc: 0.01 ng/mL (ref 0.00–0.08)

## 2018-06-21 LAB — CBG MONITORING, ED: GLUCOSE-CAPILLARY: 154 mg/dL — AB (ref 70–99)

## 2018-06-21 MED ORDER — HEPARIN SODIUM (PORCINE) 5000 UNIT/ML IJ SOLN
5000.0000 [IU] | Freq: Three times a day (TID) | INTRAMUSCULAR | Status: DC
  Administered 2018-06-21 – 2018-06-23 (×5): 5000 [IU] via SUBCUTANEOUS
  Filled 2018-06-21 (×5): qty 1

## 2018-06-21 MED ORDER — SODIUM CHLORIDE 0.9 % IV SOLN
1.0000 g | INTRAVENOUS | Status: DC
  Administered 2018-06-22: 1 g via INTRAVENOUS
  Filled 2018-06-21: qty 1

## 2018-06-21 MED ORDER — SODIUM CHLORIDE 0.9 % IV SOLN
1.0000 g | Freq: Once | INTRAVENOUS | Status: AC
  Administered 2018-06-21: 1 g via INTRAVENOUS
  Filled 2018-06-21: qty 10

## 2018-06-21 MED ORDER — LEVOTHYROXINE SODIUM 75 MCG PO TABS
75.0000 ug | ORAL_TABLET | ORAL | Status: DC
  Administered 2018-06-23: 75 ug via ORAL
  Filled 2018-06-21: qty 1

## 2018-06-21 MED ORDER — METOPROLOL SUCCINATE ER 100 MG PO TB24
100.0000 mg | ORAL_TABLET | Freq: Every day | ORAL | Status: DC
  Administered 2018-06-22 – 2018-06-23 (×2): 100 mg via ORAL
  Filled 2018-06-21 (×2): qty 1

## 2018-06-21 MED ORDER — SODIUM CHLORIDE 0.9 % IV SOLN
INTRAVENOUS | Status: DC
  Administered 2018-06-21: 125 mL/h via INTRAVENOUS
  Administered 2018-06-22 – 2018-06-23 (×3): via INTRAVENOUS

## 2018-06-21 MED ORDER — SODIUM BICARBONATE 650 MG PO TABS
650.0000 mg | ORAL_TABLET | Freq: Three times a day (TID) | ORAL | Status: DC
  Administered 2018-06-21 – 2018-06-23 (×5): 650 mg via ORAL
  Filled 2018-06-21 (×5): qty 1

## 2018-06-21 MED ORDER — DEXTROSE-NACL 5-0.9 % IV SOLN: Freq: Once | INTRAVENOUS | Status: DC

## 2018-06-21 MED ORDER — LEVOTHYROXINE SODIUM 75 MCG PO TABS: 75.0000 ug | ORAL_TABLET | Freq: Every day | ORAL | Status: DC

## 2018-06-21 MED ORDER — SODIUM CHLORIDE 0.9 % IV SOLN
500.0000 mg | Freq: Once | INTRAVENOUS | Status: AC
  Administered 2018-06-21: 500 mg via INTRAVENOUS
  Filled 2018-06-21: qty 500

## 2018-06-21 MED ORDER — SODIUM CHLORIDE 0.9 % IV SOLN: 500.0000 mg | Freq: Once | INTRAVENOUS | Status: DC

## 2018-06-21 MED ORDER — AMLODIPINE BESYLATE 5 MG PO TABS
5.0000 mg | ORAL_TABLET | Freq: Every day | ORAL | Status: DC
  Administered 2018-06-22 – 2018-06-23 (×2): 5 mg via ORAL
  Filled 2018-06-21 (×2): qty 1

## 2018-06-21 MED ORDER — AZITHROMYCIN 500 MG IV SOLR
500.0000 mg | INTRAVENOUS | Status: DC
  Administered 2018-06-22: 500 mg via INTRAVENOUS
  Filled 2018-06-21: qty 500

## 2018-06-21 NOTE — H&P (Signed)
History and Physical    Unknown Flannigan CWU:[id number] DOB: [birth year] DOA: 06/21/2018  PCP: Glendale Chard, MD  Patient coming from: Home  I have personally briefly reviewed patient's old medical records in [place]  Chief Complaint: Hypoglycemia, LOC  HPI: [name] is a 72 y.o. female with medical history significant of CKD stage 4, anemia on iron infusions, HTN.  Patient has been having cough for last 1.5 weeks.  Vomiting and diarrhea for the past month which she attributes to starting iron infusions (although symptoms didn't onset till 2-3 days after first infusion).  Over last few days has had decreased PO intake.  Had generalized weakness and ultimately syncope today.  EMS called.  BGL 33 on EMS arrival.  Patient doesn't have DM and is on no hypoglycemic meds.   ED Course: Given D10, feels better.  Work up in ED shows LLL PNA and AKI with creat 3.8 up from ~2.0 baseline it looks like.   Review of Systems: As per HPI otherwise 10 point review of systems negative.   Past Medical History:  Diagnosis Date  . Hypertension   . Liver disease   . Renal disorder   . Thyroid disease     Past Surgical History:  Procedure Laterality Date  . COCHLEAR IMPLANT    . KNEE SURGERY       reports that she has quit smoking. She has never used smokeless tobacco. She reports that she does not drink alcohol or use drugs.  No Known Allergies  Family History  Problem Relation Age of Onset  . Hypertension Other   . Cervical cancer Mother   . Aneurysm Father      Prior to Admission medications   Medication Sig Start Date End Date Taking? Authorizing Provider  amLODipine (NORVASC) 5 MG tablet Take 1 tablet (5 mg total) by mouth daily. 04/03/13  Yes Dhungel, Nishant, MD  hydrochlorothiazide (HYDRODIURIL) 12.5 MG tablet Take 12.5 mg by mouth daily.  03/25/18  Yes [provider]  levothyroxine (SYNTHROID, LEVOTHROID) 75 MCG tablet Take 1 tablet (75 mcg total) by mouth  daily. 01/25/18  Yes Ghimire, Henreitta Leber, MD  metoprolol succinate (TOPROL-XL) 100 MG 24 hr tablet Take 1 tablet (100 mg total) by mouth daily. Take with or immediately following a meal. 02/27/18 02/22/19 Yes Belva Crome, MD  senna-docusate (SENOKOT-S) 8.6-50 MG per tablet Take 1 tablet by mouth daily. 04/03/13  Yes Dhungel, Nishant, MD  sodium bicarbonate 650 MG tablet Take 1 tablet (650 mg total) by mouth 3 (three) times daily. 04/03/13  Yes Dhungel, Nishant, MD  loperamide (IMODIUM) 2 MG capsule Take 1 capsule (2 mg total) by mouth as needed for diarrhea or loose stools. Do not take more than 16 mg/day 01/25/18   Jonetta Osgood, MD    Physical Exam: Vitals:   06/21/18 1816 06/21/18 1951  BP: 125/83 124/87  Pulse: (!) 58 (!) 55  Resp: 18 17  Temp: 97.8 F (36.6 C)   TempSrc: Oral   SpO2: 96% 99%    Constitutional: NAD, calm, comfortable Eyes: PERRL, lids and conjunctivae normal ENMT: Mucous membranes are moist. Posterior pharynx clear of any exudate or lesions.Normal dentition.  Neck: normal, supple, no masses, no thyromegaly Respiratory: clear to auscultation bilaterally, no wheezing, no crackles. Normal respiratory effort. No accessory muscle use.  Cardiovascular: Regular rate and rhythm, no murmurs / rubs / gallops. No extremity edema. 2+ pedal pulses. No carotid bruits.  Abdomen: no tenderness, no masses palpated. No hepatosplenomegaly.  Bowel sounds positive.  Musculoskeletal: no clubbing / cyanosis. No joint deformity upper and lower extremities. Good ROM, no contractures. Normal muscle tone.  Skin: no rashes, lesions, ulcers. No induration Neurologic: CN 2-12 grossly intact. Sensation intact, DTR normal. Strength 5/5 in all 4.  Psychiatric: Normal judgment and insight. Alert and oriented x 3. Normal mood.    Labs on Admission: I have personally reviewed following labs and imaging studies  CBC: Recent Labs  Lab 06/21/18 1850  WBC 9.2  NEUTROABS 8.2*  HGB 11.3*  HCT 33.9*    MCV 106.9*  PLT PLATELET CLUMPS NOTED ON SMEAR, UNABLE TO ESTIMATE   Basic Metabolic Panel: Recent Labs  Lab 06/21/18 1850  NA 130*  K 3.8  CL 101  CO2 18*  GLUCOSE 142*  BUN 55*  CREATININE 3.89*  CALCIUM 7.2*   GFR: CrCl cannot be calculated (Unknown ideal weight.). Liver Function Tests: Recent Labs  Lab 06/21/18 1850  AST 67*  ALT 46*  ALKPHOS 101  BILITOT 1.1  PROT 5.1*  ALBUMIN 2.5*   No results for input(s): LIPASE, AMYLASE in the last 168 hours. No results for input(s): AMMONIA in the last 168 hours. Coagulation Profile: No results for input(s): INR, PROTIME in the last 168 hours. Cardiac Enzymes: No results for input(s): CKTOTAL, CKMB, CKMBINDEX, TROPONINI in the last 168 hours. BNP (last 3 results) No results for input(s): PROBNP in the last 8760 hours. HbA1C: No results for input(s): HGBA1C in the last 72 hours. CBG: Recent Labs  Lab 06/21/18 1821  GLUCAP 154*   Lipid Profile: No results for input(s): CHOL, HDL, LDLCALC, TRIG, CHOLHDL, LDLDIRECT in the last 72 hours. Thyroid Function Tests: No results for input(s): TSH, T4TOTAL, FREET4, T3FREE, THYROIDAB in the last 72 hours. Anemia Panel: No results for input(s): VITAMINB12, FOLATE, FERRITIN, TIBC, IRON, RETICCTPCT in the last 72 hours. Urine analysis:    Component Value Date/Time   COLORURINE YELLOW 01/22/2018 1236   APPEARANCEUR CLOUDY (A) 01/22/2018 1236   LABSPEC 1.020 01/22/2018 1236   PHURINE 5.0 01/22/2018 1236   GLUCOSEU NEGATIVE 01/22/2018 1236   HGBUR SMALL (A) 01/22/2018 1236   BILIRUBINUR NEGATIVE 01/22/2018 1236   KETONESUR NEGATIVE 01/22/2018 1236   PROTEINUR 30 (A) 01/22/2018 1236   UROBILINOGEN 0.2 03/30/2013 0519   NITRITE POSITIVE (A) 01/22/2018 1236   LEUKOCYTESUR LARGE (A) 01/22/2018 1236    Radiological Exams on Admission: Dg Chest 2 View  Result Date: 06/21/2018 CLINICAL DATA:  Syncope.  Cough. EXAM: CHEST - 2 VIEW COMPARISON:  Chest x-ray dated January 22, 2018. FINDINGS: The heart size and mediastinal contours are within normal limits. Normal pulmonary vascularity. The lungs remain hyperinflated with emphysematous changes. New patchy opacities in the left lower lobe. Stable scarring in the right upper lobe. No pleural effusion or pneumothorax. No acute osseous abnormality. IMPRESSION: 1. New patchy opacities in the left lower lobe, suspicious for pneumonia. Followup PA and lateral chest X-ray is recommended in 3-4 weeks following trial of antibiotic therapy to ensure resolution and exclude underlying malignancy. 2. COPD. Electronically Signed   By: Titus Dubin M.D.   On: 06/21/2018 20:16    EKG: Independently reviewed.  Assessment/Plan Principal Problem:   Community acquired pneumonia of left lower lobe of lung ([place]) Active Problems:   CKD (chronic kidney disease) stage 4, GFR 15-29 ml/min (HCC)   Hypothyroidism   AKI (acute kidney injury) ([place])   Essential hypertension   Diarrhea    1. LLL CAP - 1. PNA pathway 2.  Rocephin / azithro 3. Cultures pending 2. AKI on CKD stage 4 - 1. Likely dehydration due to PNA, decreased PO intake, N/V 2. IVF: NS at 125 cc/hr 3. Repeat BMP in AM 3. Diarrhea + Vomiting - 1. C.Diff ordered for ruleout - loperamide if negative 2. Probably related to PNA illness though 3. IVF 4. ABx as for PNA for the moment 4. HTN - 1. Hold HCTZ 2. Will continue toprol and norvasc daily for now 5. Hypothyroidism - 1. Synthroid recently reduced to M-F only, will put in for M-F synthroid  DVT prophylaxis: Heparin [place] Code Status: Full Family Communication: Family at bedside Disposition Plan: Home after admit Consults called: None Admission status: Admit to inpatient   Etta Quill DO [facility] Pager [phone] Only works nights!  If 7AM-7PM, please contact the primary day team physician taking care of patient  [email or web address] Password [email or web address]  06/21/2018, 8:55 PM

## 2018-06-21 NOTE — ED Notes (Signed)
Pt transported to x-ray. Will attempt to get a urine specimen when pt returns.

## 2018-06-21 NOTE — ED Notes (Signed)
ED TO INPATIENT HANDOFF REPORT  Name/Age/Gender [name] 72 y.o. female  Code Status    Code Status Orders  (From admission, onward)        Start     Ordered   06/21/18 2038  Full code  Continuous     06/21/18 2039    Code Status History    Date Active Date Inactive Code Status Order ID Comments User Context   01/22/2018 1447 01/25/2018 1453 Full Code [id number]  Reyne Dumas, MD ED      Home/SNF/Other Home  Chief Complaint Hypoglycemia  Level of Care/Admitting Diagnosis ED Disposition    ED Disposition Condition [place] [facility] Area: [facility] [[id number]]  Level of Care: Med-Surg [16]  Diagnosis: Community acquired pneumonia of left lower lobe of lung [facility]) [[id number]]  Admitting Physician: Doreatha Massed  Attending Physician: Etta Quill [phone]  Estimated length of stay: past midnight tomorrow  Certification:: I certify this patient will need inpatient services for at least 2 midnights  PT Class (Do Not Modify): Inpatient [101]  PT Acc Code (Do Not Modify): Private [1]       Medical History Past Medical History:  Diagnosis Date  . Hypertension   . Liver disease   . Renal disorder   . Thyroid disease     Allergies No Known Allergies  IV Location/Drains/Wounds Patient Lines/Drains/Airways Status   Active Line/Drains/Airways    Name:   Placement date:   Placement time:   Site:   Days:   Peripheral IV 06/21/18 Left Antecubital   06/21/18    1817    Antecubital   less than 1          Labs/Imaging Results for orders placed or performed during the hospital encounter of 06/21/18 (from the past 48 hour(s))  CBG monitoring, ED     Status: Abnormal   Collection Time: 06/21/18  6:21 PM  Result Value Ref Range   Glucose-Capillary 154 (H) 70 - 99 mg/dL  Comprehensive metabolic panel     Status: Abnormal   Collection Time: 06/21/18  6:50 PM  Result Value Ref Range   Sodium 130 (L) 135 - 145 mmol/L   Potassium  3.8 3.5 - 5.1 mmol/L   Chloride 101 98 - 111 mmol/L    Comment: Please note change in reference range.   CO2 18 (L) 22 - 32 mmol/L   Glucose, Bld 142 (H) 70 - 99 mg/dL    Comment: Please note change in reference range.   BUN 55 (H) 8 - 23 mg/dL    Comment: Please note change in reference range.   Creatinine, Ser 3.89 (H) 0.44 - 1.00 mg/dL   Calcium 7.2 (L) 8.9 - 10.3 mg/dL   Total Protein 5.1 (L) 6.5 - 8.1 g/dL   Albumin 2.5 (L) 3.5 - 5.0 g/dL   AST 67 (H) 15 - 41 U/L   ALT 46 (H) 0 - 44 U/L    Comment: Please note change in reference range.   Alkaline Phosphatase 101 38 - 126 U/L   Total Bilirubin 1.1 0.3 - 1.2 mg/dL   GFR calc non Af Amer 10 (L) >60 mL/min   GFR calc Af Amer 12 (L) >60 mL/min    Comment: (NOTE) The eGFR has been calculated using the CKD EPI equation. This calculation has not been validated in all clinical situations. eGFR's persistently <60 mL/min signify possible Chronic Kidney Disease.    Anion gap 11 5 -  15    Comment: Performed at [facility], [place] [address], [place], [place] [address]  CBC with Differential     Status: Abnormal   Collection Time: 06/21/18  6:50 PM  Result Value Ref Range   WBC 9.2 4.0 - 10.5 K/uL   RBC 3.17 (L) 3.87 - 5.11 MIL/uL   Hemoglobin 11.3 (L) 12.0 - 15.0 g/dL   HCT 33.9 (L) 36.0 - 46.0 %   MCV 106.9 (H) 78.0 - 100.0 fL   MCH 35.6 (H) 26.0 - 34.0 pg   MCHC 33.3 30.0 - 36.0 g/dL   RDW 15.6 (H) 11.5 - 15.5 %   Platelets PLATELET CLUMPS NOTED ON SMEAR, UNABLE TO ESTIMATE 150 - 400 K/uL   Neutrophils Relative % 89 %   Lymphocytes Relative 4 %   Monocytes Relative 7 %   Eosinophils Relative 0 %   Basophils Relative 0 %   Neutro Abs 8.2 (H) 1.7 - 7.7 K/uL   Lymphs Abs 0.4 (L) 0.7 - 4.0 K/uL   Monocytes Absolute 0.6 0.1 - 1.0 K/uL   Eosinophils Absolute 0.0 0.0 - 0.7 K/uL   Basophils Absolute 0.0 0.0 - 0.1 K/uL   RBC Morphology RARE NRBCs     Comment: POLYCHROMASIA PRESENT TEARDROP CELLS BURR CELLS Bite  cells present    WBC Morphology MILD LEFT SHIFT (1-5% METAS, OCC MYELO, OCC BANDS)     Comment: TOXIC GRANULATION Performed at [facility] [address] [place], [place] [address]   I-stat troponin, ED     Status: None   Collection Time: 06/21/18  7:00 PM  Result Value Ref Range   Troponin i, poc 0.01 0.00 - 0.08 ng/mL   Comment 3            Comment: Due to the release kinetics of cTnI, a negative result within the first hours of the onset of symptoms does not rule out myocardial infarction with certainty. If myocardial infarction is still suspected, repeat the test at appropriate intervals.    Dg Chest 2 View  Result Date: 06/21/2018 CLINICAL DATA:  Syncope.  Cough. EXAM: CHEST - 2 VIEW COMPARISON:  Chest x-ray dated January 22, 2018. FINDINGS: The heart size and mediastinal contours are within normal limits. Normal pulmonary vascularity. The lungs remain hyperinflated with emphysematous changes. New patchy opacities in the left lower lobe. Stable scarring in the right upper lobe. No pleural effusion or pneumothorax. No acute osseous abnormality. IMPRESSION: 1. New patchy opacities in the left lower lobe, suspicious for pneumonia. Followup PA and lateral chest X-ray is recommended in 3-4 weeks following trial of antibiotic therapy to ensure resolution and exclude underlying malignancy. 2. COPD. Electronically Signed   By: Titus Dubin M.D.   On: 06/21/2018 20:16    Pending Labs Unresulted Labs (From admission, onward)   Start     Ordered   06/22/18 0500  CBC  Tomorrow morning,   R     06/21/18 2039   06/22/18 4665  Basic metabolic panel  Tomorrow morning,   R     06/21/18 2039   06/21/18 2036  Culture, blood (routine x 2) Call MD if unable to obtain prior to antibiotics being given  BLOOD CULTURE X 2,   R    Comments:  If blood cultures drawn in Emergency Department - Do not draw and cancel order    06/21/18 2039   06/21/18 2036  Culture,  sputum-assessment  Once,   R  06/21/18 2039   06/21/18 2036  Gram stain  Once,   R     06/21/18 2039   06/21/18 2036  HIV antibody (Routine Screening)  Once,   R     06/21/18 2039   06/21/18 2036  Strep pneumoniae urinary antigen  Once,   R     06/21/18 2039   06/21/18 1926  C difficile quick scan w PCR reflex  (C Difficile quick screen w PCR reflex panel)  Once, for 24 hours,   R     06/21/18 1925   06/21/18 1850  Urinalysis, Routine w reflex microscopic  STAT,   STAT     06/21/18 1849      Vitals/Pain Today's Vitals   06/21/18 1816 06/21/18 1951  BP: 125/83 124/87  Pulse: (!) 58 (!) 55  Resp: 18 17  Temp: 97.8 F (36.6 C)   TempSrc: Oral   SpO2: 96% 99%  PainSc: 0-No pain     Isolation Precautions Enteric precautions (UV disinfection)  Medications Medications  0.9 %  sodium chloride infusion (125 mL/hr Intravenous New Bag/Given 06/21/18 2018)  cefTRIAXone (ROCEPHIN) 1 g in sodium chloride 0.9 % 100 mL IVPB (has no administration in time range)  azithromycin (ZITHROMAX) 500 mg in sodium chloride 0.9 % 250 mL IVPB (has no administration in time range)  amLODipine (NORVASC) tablet 5 mg (has no administration in time range)  metoprolol succinate (TOPROL-XL) 24 hr tablet 100 mg (has no administration in time range)  sodium bicarbonate tablet 650 mg (has no administration in time range)  heparin injection 5,000 Units (has no administration in time range)  cefTRIAXone (ROCEPHIN) 1 g in sodium chloride 0.9 % 100 mL IVPB (has no administration in time range)  azithromycin (ZITHROMAX) 500 mg in sodium chloride 0.9 % 250 mL IVPB (has no administration in time range)  levothyroxine (SYNTHROID, LEVOTHROID) tablet 75 mcg (has no administration in time range)    Mobility walks

## 2018-06-21 NOTE — ED Notes (Signed)
Bed: YY48 Expected date:  Expected time:  Means of arrival:  Comments: Syncopal episode- CBG 33 on scene

## 2018-06-21 NOTE — ED Triage Notes (Signed)
EMS reports from home called out for syncopal episode, witnessed by family, upon EMS arrival lethargic but A&O x 4. CBG on arrival 8. Decreased appetite over last few days per family. Denies pain.  BP 110/40 HR 70 Resp 16 Sp02 97 RA  20ga LAC  25gms D-10 post CBG 265 528ml NS

## 2018-06-21 NOTE — ED Provider Notes (Signed)
[facility] DEPT Provider Note   CSN: [id number] Arrival date & time: 06/21/18  1802     History   Chief Complaint Chief Complaint  Patient presents with  . Hypoglycemia  . Loss of Consciousness    HPI [name] is a 72 y.o. female.  Patient is a 72 year old female with a history of hypertension, hypothyroidism, anemia who presents with weakness.  She got up this morning feeling generally weak.  She did not have much energy.  She fell asleep for several hours which she does not normally do.  Her family said that she did not look like her normal self.  She went to lay down on the bed and as she was face timing her daughter, she fell back and became less responsive.  She said that she just wanted to go to sleep.  She was awake the whole time and was still talking to her daughter.  She did not fall.  She denies any chest pain or palpitations.  No shortness of breath.  She has had a cough for about the last week and a half.  She also has been getting iron infusions over the last month and since that time is had ongoing watery diarrhea and vomiting.  She has not had much of an appetite.  She was found to be hypoglycemic on EMS arrival with a glucose of 33.  She was given D10 and feels better now.     Past Medical History:  Diagnosis Date  . Hypertension   . Liver disease   . Renal disorder   . Thyroid disease     Patient Active Problem List   Diagnosis Date Noted  . Community acquired pneumonia of left lower lobe of lung ([place]) 06/21/2018  . Diarrhea 06/21/2018  . Essential hypertension 02/27/2018  . Narrow complex tachycardia ([place]) 02/26/2018  . Protein-calorie malnutrition, severe 01/25/2018  . UTI (urinary tract infection) 01/22/2018  . AKI (acute kidney injury) ([place]) 01/22/2018  . Left renal mass 04/03/2013  . Right upper lobe consolidation ([place]) 03/30/2013  . Cough with hemoptysis 03/30/2013  . Tobacco abuse 03/30/2013  . Weight loss  03/30/2013  . Thiazide diuretics causing adverse effect in therapeutic use 03/30/2013  . CKD (chronic kidney disease) stage 4, GFR 15-29 ml/min ([facility])   . Liver disease/? cirrhosis   . Hypothyroidism     Past Surgical History:  Procedure Laterality Date  . COCHLEAR IMPLANT    . KNEE SURGERY       OB History   None      Home Medications    Prior to Admission medications   Medication Sig Start Date End Date Taking? Authorizing Provider  amLODipine (NORVASC) 5 MG tablet Take 1 tablet (5 mg total) by mouth daily. 04/03/13  Yes Dhungel, Nishant, MD  hydrochlorothiazide (HYDRODIURIL) 12.5 MG tablet Take 12.5 mg by mouth daily.  03/25/18  Yes [provider]  levothyroxine (SYNTHROID, LEVOTHROID) 75 MCG tablet Take 1 tablet (75 mcg total) by mouth daily. 01/25/18  Yes Ghimire, Henreitta Leber, MD  metoprolol succinate (TOPROL-XL) 100 MG 24 hr tablet Take 1 tablet (100 mg total) by mouth daily. Take with or immediately following a meal. 02/27/18 02/22/19 Yes Belva Crome, MD  senna-docusate (SENOKOT-S) 8.6-50 MG per tablet Take 1 tablet by mouth daily. 04/03/13  Yes Dhungel, Nishant, MD  sodium bicarbonate 650 MG tablet Take 1 tablet (650 mg total) by mouth 3 (three) times daily. 04/03/13  Yes Dhungel, Flonnie Overman, MD  loperamide (  IMODIUM) 2 MG capsule Take 1 capsule (2 mg total) by mouth as needed for diarrhea or loose stools. Do not take more than 16 mg/day 01/25/18   Jonetta Osgood, MD    Family History Family History  Problem Relation Age of Onset  . Hypertension Other   . Cervical cancer Mother   . Aneurysm Father     Social History Social History   Tobacco Use  . Smoking status: Former Research scientist (life sciences)  . Smokeless tobacco: Never Used  Substance Use Topics  . Alcohol use: No  . Drug use: No     Allergies   Patient has no known allergies.   Review of Systems Review of Systems  Constitutional: Positive for fatigue. Negative for chills, diaphoresis and fever.  HENT: Negative for  congestion, rhinorrhea and sneezing.   Eyes: Negative.   Respiratory: Positive for cough. Negative for chest tightness and shortness of breath.   Cardiovascular: Negative for chest pain and leg swelling.  Gastrointestinal: Positive for diarrhea, nausea and vomiting. Negative for abdominal pain and blood in stool.  Genitourinary: Negative for difficulty urinating, flank pain, frequency and hematuria.  Musculoskeletal: Negative for arthralgias and back pain.  Skin: Negative for rash.  Neurological: Negative for dizziness, speech difficulty, weakness, numbness and headaches.     Physical Exam Updated Vital Signs BP 124/87 (BP Location: Left Arm)   Pulse (!) 55   Temp 97.8 F (36.6 C) (Oral)   Resp 17   SpO2 99%   Physical Exam  Constitutional: She is oriented to person, place, and time. She appears well-developed and well-nourished.  HENT:  Head: Normocephalic and atraumatic.  Eyes: Pupils are equal, round, and reactive to light.  Neck: Normal range of motion. Neck supple.  Cardiovascular: Normal rate, regular rhythm and normal heart sounds.  Pulmonary/Chest: Effort normal and breath sounds normal. No respiratory distress. She has no wheezes. She has no rales. She exhibits no tenderness.  Abdominal: Soft. Bowel sounds are normal. There is no tenderness. There is no rebound and no guarding.  Musculoskeletal: Normal range of motion. She exhibits no edema.  Lymphadenopathy:    She has no cervical adenopathy.  Neurological: She is alert and oriented to person, place, and time.  Skin: Skin is warm and dry. No rash noted.  Psychiatric: She has a normal mood and affect.     ED Treatments / Results  Labs (all labs ordered are listed, but only abnormal results are displayed) Labs Reviewed  COMPREHENSIVE METABOLIC PANEL - Abnormal; Notable for the following components:      Result Value   Sodium 130 (*)    CO2 18 (*)    Glucose, Bld 142 (*)    BUN 55 (*)    Creatinine, Ser 3.89  (*)    Calcium 7.2 (*)    Total Protein 5.1 (*)    Albumin 2.5 (*)    AST 67 (*)    ALT 46 (*)    GFR calc non Af Amer 10 (*)    GFR calc Af Amer 12 (*)    All other components within normal limits  CBC WITH DIFFERENTIAL/PLATELET - Abnormal; Notable for the following components:   RBC 3.17 (*)    Hemoglobin 11.3 (*)    HCT 33.9 (*)    MCV 106.9 (*)    MCH 35.6 (*)    RDW 15.6 (*)    Neutro Abs 8.2 (*)    Lymphs Abs 0.4 (*)    All other components within normal  limits  CBG MONITORING, ED - Abnormal; Notable for the following components:   Glucose-Capillary 154 (*)    All other components within normal limits  C DIFFICILE QUICK SCREEN W PCR REFLEX  CULTURE, BLOOD (ROUTINE X 2)  CULTURE, BLOOD (ROUTINE X 2)  CULTURE, EXPECTORATED SPUTUM-ASSESSMENT  GRAM STAIN  URINALYSIS, ROUTINE W REFLEX MICROSCOPIC  HIV ANTIBODY (ROUTINE TESTING)  STREP PNEUMONIAE URINARY ANTIGEN  CBC  BASIC METABOLIC PANEL  I-STAT CHEM 8, ED  I-STAT TROPONIN, ED    EKG EKG Interpretation  Date/Time:  Saturday June 21 2018 18:57:35 EDT Ventricular Rate:  54 PR Interval:    QRS Duration: 89 QT Interval:  471 QTC Calculation: 447 R Axis:   137 Text Interpretation:  Right and left arm electrode reversal, interpretation assumes no reversal Sinus rhythm Right axis deviation Abnormal T, consider ischemia, diffuse leads since last tracing no significant change Confirmed by Malvin Johns [phone]) on 06/21/2018 9:04:17 PM   Radiology Dg Chest 2 View  Result Date: 06/21/2018 CLINICAL DATA:  Syncope.  Cough. EXAM: CHEST - 2 VIEW COMPARISON:  Chest x-ray dated January 22, 2018. FINDINGS: The heart size and mediastinal contours are within normal limits. Normal pulmonary vascularity. The lungs remain hyperinflated with emphysematous changes. New patchy opacities in the left lower lobe. Stable scarring in the right upper lobe. No pleural effusion or pneumothorax. No acute osseous abnormality. IMPRESSION: 1. New  patchy opacities in the left lower lobe, suspicious for pneumonia. Followup PA and lateral chest X-ray is recommended in 3-4 weeks following trial of antibiotic therapy to ensure resolution and exclude underlying malignancy. 2. COPD. Electronically Signed   By: Titus Dubin M.D.   On: 06/21/2018 20:16    Procedures Procedures (including critical care time)  Medications Ordered in ED Medications  0.9 %  sodium chloride infusion (125 mL/hr Intravenous New Bag/Given 06/21/18 2018)  cefTRIAXone (ROCEPHIN) 1 g in sodium chloride 0.9 % 100 mL IVPB (has no administration in time range)  azithromycin (ZITHROMAX) 500 mg in sodium chloride 0.9 % 250 mL IVPB (has no administration in time range)  amLODipine (NORVASC) tablet 5 mg (has no administration in time range)  metoprolol succinate (TOPROL-XL) 24 hr tablet 100 mg (has no administration in time range)  sodium bicarbonate tablet 650 mg (has no administration in time range)  heparin injection 5,000 Units (has no administration in time range)  cefTRIAXone (ROCEPHIN) 1 g in sodium chloride 0.9 % 100 mL IVPB (has no administration in time range)  azithromycin (ZITHROMAX) 500 mg in sodium chloride 0.9 % 250 mL IVPB (has no administration in time range)  levothyroxine (SYNTHROID, LEVOTHROID) tablet 75 mcg (has no administration in time range)     Initial Impression / Assessment and Plan / ED Course  I have reviewed the triage vital signs and the nursing notes.  Pertinent labs & imaging results that were available during my care of the patient were reviewed by me and considered in my medical decision making (see chart for details).     Patient is a 72 year old female who presents with generalized weakness and an episode of hypoglycemia with near syncope earlier today.  She has evidence of pneumonia on x-ray.  She was started on IV antibiotics.  Her labs show acute kidney injury.  She was started on IV fluids.  Her blood sugar is stabilized.  I spoke  with Dr. Alcario Drought who will admit the patient for further treatment.  Final Clinical Impressions(s) / ED Diagnoses   Final diagnoses:  Community  acquired pneumonia of left lung, unspecified part of lung  Hypoglycemia  AKI (acute kidney injury) [facility])    ED Discharge Orders    None       Malvin Johns, MD 06/21/18 2104

## 2018-06-22 DIAGNOSIS — J181 Lobar pneumonia, unspecified organism: Principal | ICD-10-CM

## 2018-06-22 DIAGNOSIS — N179 Acute kidney failure, unspecified: Secondary | ICD-10-CM

## 2018-06-22 DIAGNOSIS — E039 Hypothyroidism, unspecified: Secondary | ICD-10-CM

## 2018-06-22 DIAGNOSIS — N184 Chronic kidney disease, stage 4 (severe): Secondary | ICD-10-CM

## 2018-06-22 DIAGNOSIS — E162 Hypoglycemia, unspecified: Secondary | ICD-10-CM

## 2018-06-22 DIAGNOSIS — I10 Essential (primary) hypertension: Secondary | ICD-10-CM

## 2018-06-22 LAB — BASIC METABOLIC PANEL
Anion gap: 13 (ref 5–15)
BUN: 51 mg/dL — ABNORMAL HIGH (ref 8–23)
CHLORIDE: 101 mmol/L (ref 98–111)
CO2: 18 mmol/L — AB (ref 22–32)
Calcium: 7.8 mg/dL — ABNORMAL LOW (ref 8.9–10.3)
Creatinine, Ser: 3.7 mg/dL — ABNORMAL HIGH (ref 0.44–1.00)
GFR calc Af Amer: 13 mL/min — ABNORMAL LOW (ref >60)
GFR calc non Af Amer: 11 mL/min — ABNORMAL LOW (ref >60)
GLUCOSE: 65 mg/dL — AB (ref 70–99)
POTASSIUM: 4.8 mmol/L (ref 3.5–5.1)
Sodium: 132 mmol/L — ABNORMAL LOW (ref 135–145)

## 2018-06-22 LAB — CBC
HEMATOCRIT: 34.2 % — AB (ref 36.0–46.0)
HEMOGLOBIN: 11.8 g/dL — AB (ref 12.0–15.0)
MCH: 35.8 pg — ABNORMAL HIGH (ref 26.0–34.0)
MCHC: 34.5 g/dL (ref 30.0–36.0)
MCV: 103.6 fL — AB (ref 78.0–100.0)
Platelets: 209 10*3/uL (ref 150–400)
RBC: 3.3 MIL/uL — AB (ref 3.87–5.11)
RDW: 15.2 % (ref 11.5–15.5)
WBC: 7.4 10*3/uL (ref 4.0–10.5)

## 2018-06-22 MED ORDER — DM-GUAIFENESIN ER 30-600 MG PO TB12
1.0000 | ORAL_TABLET | Freq: Two times a day (BID) | ORAL | Status: DC
  Administered 2018-06-22 – 2018-06-23 (×3): 1 via ORAL
  Filled 2018-06-22 (×3): qty 1

## 2018-06-22 MED ORDER — ONDANSETRON HCL 4 MG/2ML IJ SOLN
4.0000 mg | Freq: Four times a day (QID) | INTRAMUSCULAR | Status: DC | PRN
  Administered 2018-06-22: 4 mg via INTRAVENOUS
  Filled 2018-06-22: qty 2

## 2018-06-22 NOTE — Progress Notes (Signed)
PROGRESS NOTE    Sharlee Rufino   NID:[id number]  DOB: 12/12/1946  DOA: 06/21/2018 PCP: Glendale Chard, MD   Brief Narrative:  Margia Wiesen is a 72 y.o. female with medical history significant of CKD stage 4, anemia on iron infusions, HTN.  Patient has been having cough for last 1.5 weeks.   She has been receiving Iron infusions and has been having nausea, vomiting and some diarrhea from this. She presents with fevers and weakness and is found to have a CBG of 33. CXR showed a LLL pneumonia. Renal function was elevated with a CR of 3.8 up from 2.0.    Subjective: She has a mild cough but no longer has vomiting and diarrhea.     Assessment & Plan:   Principal Problem:   Community acquired pneumonia of left lower lobe of lung - cont Rocephin and Azithromycin - appears to be recuperating well  Active Problems:   AKI and hyponatremia on CKD (chronic kidney disease) stage 4, GFR 15-29 ml/min  -cont slow IVF- oral intake has improved and GI symptoms have resolved  Metabolic acidosis - due to above- chronically on Bicarb tabs  Hypoglycemia - due to poor oral intake- has resolved    Hypothyroidism - cont Levothyroxine    Essential hypertension - Norvasc, Metoprolol      DVT prophylaxis: heparin Code Status: Full code Family Communication: with daughter Disposition Plan: home when stable Consultants:   none Procedures:   none Antimicrobials:  Anti-infectives (From admission, onward)   Start     Dose/Rate Route Frequency Ordered Stop   06/22/18 2200  cefTRIAXone (ROCEPHIN) 1 g in sodium chloride 0.9 % 100 mL IVPB     1 g 200 mL/hr over 30 Minutes Intravenous Every 24 hours 06/21/18 2039 06/28/18 2159   06/22/18 2200  azithromycin (ZITHROMAX) 500 mg in sodium chloride 0.9 % 250 mL IVPB     500 mg 250 mL/hr over 60 Minutes Intravenous Every 24 hours 06/21/18 2039 06/28/18 2159   06/21/18 2300  azithromycin (ZITHROMAX) 500 mg in sodium chloride 0.9 % 250 mL IVPB     500 mg 250 mL/hr over 60 Minutes Intravenous  Once 06/21/18 2205 06/22/18 0005   06/21/18 2030  cefTRIAXone (ROCEPHIN) 1 g in sodium chloride 0.9 % 100 mL IVPB     1 g 200 mL/hr over 30 Minutes Intravenous  Once 06/21/18 2023 06/21/18 2145   06/21/18 2030  azithromycin (ZITHROMAX) 500 mg in sodium chloride 0.9 % 250 mL IVPB  Status:  Discontinued     500 mg 250 mL/hr over 60 Minutes Intravenous  Once 06/21/18 2023 06/22/18 0027       Objective: Vitals:   06/21/18 1816 06/21/18 1951 06/21/18 2153 06/22/18 0638  BP: 125/83 124/87 (!) 115/97 113/61  Pulse: (!) 58 (!) 55 69 71  Resp: $Remo'18 17 18 18  'MuSHM$ Temp: 97.8 F (36.6 C)  97.9 F (36.6 C) 97.9 F (36.6 C)  TempSrc: Oral  Oral Oral  SpO2: 96% 99% 100% 97%    Intake/Output Summary (Last 24 hours) at 06/22/2018 1336 Last data filed at 06/22/2018 0500 Gross per 24 hour  Intake 1500 ml  Output 400 ml  Net 1100 ml   There were no vitals filed for this visit.  Examination: General exam: Appears comfortable  HEENT: PERRLA, oral mucosa moist, no sclera icterus or thrush Respiratory system: faint crackles in RLL Cardiovascular system: S1 & S2 heard, RRR.   Gastrointestinal system: Abdomen soft, non-tender, nondistended. Normal bowel sound.  No organomegaly Central nervous system: Alert and oriented. No focal neurological deficits. Extremities: No cyanosis, clubbing or edema Skin: No rashes or ulcers Psychiatry:  Mood & affect appropriate.     Data Reviewed: I have personally reviewed following labs and imaging studies  CBC: Recent Labs  Lab 06/21/18 1850 06/22/18 0632  WBC 9.2 7.4  NEUTROABS 8.2*  --   HGB 11.3* 11.8*  HCT 33.9* 34.2*  MCV 106.9* 103.6*  PLT PLATELET CLUMPS NOTED ON SMEAR, UNABLE TO ESTIMATE 643   Basic Metabolic Panel: Recent Labs  Lab 06/21/18 1850 06/22/18 0632  NA 130* 132*  K 3.8 4.8  CL 101 101  CO2 18* 18*  GLUCOSE 142* 65*  BUN 55* 51*  CREATININE 3.89* 3.70*  CALCIUM 7.2* 7.8*    GFR: CrCl cannot be calculated (Unknown ideal weight.). Liver Function Tests: Recent Labs  Lab 06/21/18 1850  AST 67*  ALT 46*  ALKPHOS 101  BILITOT 1.1  PROT 5.1*  ALBUMIN 2.5*   No results for input(s): LIPASE, AMYLASE in the last 168 hours. No results for input(s): AMMONIA in the last 168 hours. Coagulation Profile: No results for input(s): INR, PROTIME in the last 168 hours. Cardiac Enzymes: No results for input(s): CKTOTAL, CKMB, CKMBINDEX, TROPONINI in the last 168 hours. BNP (last 3 results) No results for input(s): PROBNP in the last 8760 hours. HbA1C: No results for input(s): HGBA1C in the last 72 hours. CBG: Recent Labs  Lab 06/21/18 1821  GLUCAP 154*   Lipid Profile: No results for input(s): CHOL, HDL, LDLCALC, TRIG, CHOLHDL, LDLDIRECT in the last 72 hours. Thyroid Function Tests: No results for input(s): TSH, T4TOTAL, FREET4, T3FREE, THYROIDAB in the last 72 hours. Anemia Panel: No results for input(s): VITAMINB12, FOLATE, FERRITIN, TIBC, IRON, RETICCTPCT in the last 72 hours. Urine analysis:    Component Value Date/Time   COLORURINE YELLOW 06/21/2018 1850   APPEARANCEUR HAZY (A) 06/21/2018 1850   LABSPEC 1.020 06/21/2018 1850   PHURINE 5.0 06/21/2018 1850   GLUCOSEU NEGATIVE 06/21/2018 1850   HGBUR NEGATIVE 06/21/2018 1850   BILIRUBINUR NEGATIVE 06/21/2018 1850   KETONESUR NEGATIVE 06/21/2018 1850   PROTEINUR NEGATIVE 06/21/2018 1850   UROBILINOGEN 0.2 03/30/2013 0519   NITRITE POSITIVE (A) 06/21/2018 1850   LEUKOCYTESUR TRACE (A) 06/21/2018 1850   Sepsis Labs: $[email or web address]'[id number]$ (procalcitonin:4,lacticidven:4) )No results found for this or any previous visit (from the past 240 hour(s)).       Radiology Studies: Dg Chest 2 View  Result Date: 06/21/2018 CLINICAL DATA:  Syncope.  Cough. EXAM: CHEST - 2 VIEW COMPARISON:  Chest x-ray dated January 22, 2018. FINDINGS: The heart size and mediastinal contours are within normal limits. Normal pulmonary  vascularity. The lungs remain hyperinflated with emphysematous changes. New patchy opacities in the left lower lobe. Stable scarring in the right upper lobe. No pleural effusion or pneumothorax. No acute osseous abnormality. IMPRESSION: 1. New patchy opacities in the left lower lobe, suspicious for pneumonia. Followup PA and lateral chest X-ray is recommended in 3-4 weeks following trial of antibiotic therapy to ensure resolution and exclude underlying malignancy. 2. COPD. Electronically Signed   By: Titus Dubin M.D.   On: 06/21/2018 20:16      Scheduled Meds: . amLODipine  5 mg Oral Daily  . dextromethorphan-guaiFENesin  1 tablet Oral BID  . heparin  5,000 Units Subcutaneous Q8H  . [START ON 06/23/2018] levothyroxine  75 mcg Oral Once per day on Mon Tue Wed Thu Fri  . metoprolol succinate  100 mg  Oral Daily  . sodium bicarbonate  650 mg Oral TID   Continuous Infusions: . sodium chloride 125 mL/hr at 06/22/18 1130  . azithromycin    . cefTRIAXone (ROCEPHIN)  IV       LOS: 1 day    Time spent in minutes: 35    Debbe Odea, MD [facility] Pager: [email or web address] Password [email or web address] 06/22/2018, 1:36 PM

## 2018-06-23 ENCOUNTER — Other Ambulatory Visit: Payer: Self-pay

## 2018-06-23 DIAGNOSIS — J189 Pneumonia, unspecified organism: Secondary | ICD-10-CM

## 2018-06-23 DIAGNOSIS — R197 Diarrhea, unspecified: Secondary | ICD-10-CM

## 2018-06-23 LAB — BASIC METABOLIC PANEL
Anion gap: 11 (ref 5–15)
BUN: 45 mg/dL — AB (ref 8–23)
CO2: 18 mmol/L — ABNORMAL LOW (ref 22–32)
CREATININE: 2.74 mg/dL — AB (ref 0.44–1.00)
Calcium: 7.7 mg/dL — ABNORMAL LOW (ref 8.9–10.3)
Chloride: 103 mmol/L (ref 98–111)
GFR calc Af Amer: 19 mL/min — ABNORMAL LOW (ref >60)
GFR, EST NON AFRICAN AMERICAN: 16 mL/min — AB (ref >60)
Glucose, Bld: 76 mg/dL (ref 70–99)
Potassium: 3.9 mmol/L (ref 3.5–5.1)
SODIUM: 132 mmol/L — AB (ref 135–145)

## 2018-06-23 LAB — HIV ANTIBODY (ROUTINE TESTING W REFLEX): HIV SCREEN 4TH GENERATION: NONREACTIVE

## 2018-06-23 MED ORDER — AZITHROMYCIN 500 MG PO TABS: 500.0000 mg | ORAL_TABLET | Freq: Every day | ORAL | 0 refills | Status: DC

## 2018-06-23 MED ORDER — DM-GUAIFENESIN ER 30-600 MG PO TB12: 1.0000 | ORAL_TABLET | Freq: Two times a day (BID) | ORAL | 0 refills | Status: DC

## 2018-06-23 MED ORDER — MENTHOL 3 MG MT LOZG
1.0000 | LOZENGE | OROMUCOSAL | Status: DC | PRN
  Filled 2018-06-23: qty 9

## 2018-06-23 MED ORDER — CEFPODOXIME PROXETIL 200 MG PO TABS: 200.0000 mg | ORAL_TABLET | Freq: Two times a day (BID) | ORAL | 0 refills | Status: DC

## 2018-06-23 MED ORDER — AZITHROMYCIN 250 MG PO TABS: 500.0000 mg | ORAL_TABLET | Freq: Every day | ORAL | Status: DC

## 2018-06-23 NOTE — Progress Notes (Signed)
SATURATION QUALIFICATIONS: (This note is used to comply with regulatory documentation for home oxygen)  Patient Saturations on Room Air at Rest = 95 %  Patient Saturations on Room Air while Ambulating = 90 - 92%

## 2018-06-23 NOTE — Discharge Summary (Signed)
Physician Discharge Summary  [name] YIR:[id number] DOB: Aug 25, 1946 DOA: 06/21/2018  PCP: Glendale Chard, MD  Admit date: 06/21/2018 Discharge date: 06/24/2018  Admitted From: home  Disposition:  home   Recommendations for Outpatient Follow-up:  1. F/u Bmet until Cr normal   Discharge Condition:  stable   CODE STATUS:  Full code   Consultations:  none    Discharge Diagnoses:  Principal Problem:   Community acquired pneumonia of left lower lobe of lung ([place]) Active Problems:   AKI (acute kidney injury) ([place])   Diarrhea   CKD (chronic kidney disease) stage 4, GFR 15-29 ml/min ([place])   Hypothyroidism   Essential hypertension    Brief Summary: [name] is a 72 y.o.femalewith medical history significant ofCKD stage 4, anemia on iron infusions, HTN. Patient has been having cough for last 1.5 weeks.   She has been receiving Iron infusions and has been having nausea, vomiting and some diarrhea from this. She presents with fevers and weakness and is found to have a CBG of 33. CXR showed a LLL pneumonia. Renal function was elevated with a CR of 3.8 up from 2.0.   Hospital Course:  Principal Problem:   Community acquired pneumonia of left lower lobe of lung - treating with Rocephin and Azithromycin- transition to [place]  - she is recuperating well  Active Problems:   AKI and hyponatremia on CKD (chronic kidney disease) stage 4, GFR 15-29 ml/min    oral intake has improved and GI symptoms have resolved - AKI improving with IVF- Cr 3.89 > 2.74- she will see her PCP in 4 days and have repeat blood work done to ensure she is improving  Metabolic acidosis - due to above- chronically on Bicarb tabs  Hypoglycemia - due to poor oral intake- has resolved    Hypothyroidism - cont Levothyroxine    Essential hypertension - Norvasc, Metoprolol  Discharge Exam: Vitals:   06/23/18 0628 06/23/18 0827  BP: 111/86 111/86  Pulse: (!) 55 62  Resp: 17 17  Temp: 98.3  F (36.8 C) 98.3 F (36.8 C)  SpO2: 93%    Vitals:   06/22/18 0638 06/22/18 2055 06/23/18 0628 06/23/18 0827  BP: 113/61 138/80 111/86 111/86  Pulse: 71 69 (!) 55 62  Resp: $Remo'18 18 17 17  'VcdkN$ Temp: 97.9 F (36.6 C) 98.1 F (36.7 C) 98.3 F (36.8 C) 98.3 F (36.8 C)  TempSrc: Oral Oral Oral Oral  SpO2: 97% 96% 93%   Weight:    45.1 kg (99 lb 6.8 oz)  Height:    '5\' 3"'$  (1.6 m)    General: Pt is alert, awake, not in acute distress Cardiovascular: RRR, S1/S2 +, no rubs, no gallops Respiratory: CTA bilaterally, no wheezing, no rhonchi Abdominal: Soft, NT, ND, bowel sounds + Extremities: no edema, no cyanosis   Discharge Instructions  Discharge Instructions    Diet - low sodium heart healthy   Complete by:  As directed    Increase activity slowly   Complete by:  As directed      Allergies as of 06/23/2018   No Known Allergies     Medication List    STOP taking these medications   hydrochlorothiazide 12.5 MG tablet Commonly known as:  HYDRODIURIL     TAKE these medications   amLODipine 5 MG tablet Commonly known as:  NORVASC Take 1 tablet (5 mg total) by mouth daily.   azithromycin 500 MG tablet Commonly known as:  ZITHROMAX Take 1 tablet (500 mg total) by  mouth daily.   cefpodoxime 200 MG tablet Commonly known as:  VANTIN Take 1 tablet (200 mg total) by mouth 2 (two) times daily.   dextromethorphan-guaiFENesin 30-600 MG 12hr tablet Commonly known as:  MUCINEX DM Take 1 tablet by mouth 2 (two) times daily.   levothyroxine 75 MCG tablet Commonly known as:  SYNTHROID, LEVOTHROID Take 1 tablet (75 mcg total) by mouth daily.   loperamide 2 MG capsule Commonly known as:  IMODIUM Take 1 capsule (2 mg total) by mouth as needed for diarrhea or loose stools. Do not take more than 16 mg/day   metoprolol succinate 100 MG 24 hr tablet Commonly known as:  TOPROL-XL Take 1 tablet (100 mg total) by mouth daily. Take with or immediately following a meal.   senna-docusate  8.6-50 MG tablet Commonly known as:  Senokot-S Take 1 tablet by mouth daily.   sodium bicarbonate 650 MG tablet Take 1 tablet (650 mg total) by mouth 3 (three) times daily.      Follow-up Information    Glendale Chard, MD Follow up.   Specialty:  Internal Medicine Why:  have Bmet checked on Friday Contact information: [address] [place] [address] [phone]        Belva Crome, MD .   Specialty:  Cardiology Contact information: [phone] [address] [place] [address] [phone]          No Known Allergies   Procedures/Studies:    Dg Chest 2 View  Result Date: 06/21/2018 CLINICAL DATA:  Syncope.  Cough. EXAM: CHEST - 2 VIEW COMPARISON:  Chest x-ray dated January 22, 2018. FINDINGS: The heart size and mediastinal contours are within normal limits. Normal pulmonary vascularity. The lungs remain hyperinflated with emphysematous changes. New patchy opacities in the left lower lobe. Stable scarring in the right upper lobe. No pleural effusion or pneumothorax. No acute osseous abnormality. IMPRESSION: 1. New patchy opacities in the left lower lobe, suspicious for pneumonia. Followup PA and lateral chest X-ray is recommended in 3-4 weeks following trial of antibiotic therapy to ensure resolution and exclude underlying malignancy. 2. COPD. Electronically Signed   By: Titus Dubin M.D.   On: 06/21/2018 20:16     The results of significant diagnostics from this hospitalization (including imaging, microbiology, ancillary and laboratory) are listed below for reference.     Microbiology: Recent Results (from the past 240 hour(s))  Culture, blood (routine x 2) Call MD if unable to obtain prior to antibiotics being given     Status: None (Preliminary result)   Collection Time: 06/22/18 12:46 AM  Result Value Ref Range Status   Specimen Description   Final    BLOOD RIGHT HAND Performed at [facility] [address], [place], [place] [address]    Special Requests   Final    BOTTLES DRAWN AEROBIC ONLY Blood Culture results may not be optimal due to an inadequate volume of blood received in culture bottles Performed at [facility] [address] [place], [place] [address]    Culture   Final    NO GROWTH 2 DAYS Performed at [facility] [address] [place], [place] [address]    Report Status PENDING  Incomplete  Culture, blood (routine x 2) Call MD if unable to obtain prior to antibiotics being given     Status: None (Preliminary result)   Collection Time: 06/22/18 10:24 AM  Result Value Ref Range Status   Specimen  Description   Final    BLOOD LEFT HAND Performed at [facility], [address], [place], [place] [address]    Special Requests   Final    BOTTLES DRAWN AEROBIC ONLY Blood Culture adequate volume Performed at [facility], [address], [place], [place] [address]    Culture   Final    NO GROWTH 2 DAYS Performed at [facility], [address], [place], [place] [address]    Report Status PENDING  Incomplete     Labs: BNP (last 3 results) No results for input(s): BNP in the last 8760 hours. Basic Metabolic Panel: Recent Labs  Lab 06/21/18 1850 06/22/18 0632 06/23/18 0608  NA 130* 132* 132*  K 3.8 4.8 3.9  CL 101 101 103  CO2 18* 18* 18*  GLUCOSE 142* 65* 76  BUN 55* 51* 45*  CREATININE 3.89* 3.70* 2.74*  CALCIUM 7.2* 7.8* 7.7*   Liver Function Tests: Recent Labs  Lab 06/21/18 1850  AST 67*  ALT 46*  ALKPHOS 101  BILITOT 1.1  PROT 5.1*  ALBUMIN 2.5*   No results for input(s): LIPASE, AMYLASE in the last 168 hours. No results for input(s): AMMONIA in the last 168 hours. CBC: Recent Labs  Lab 06/21/18 1850 06/22/18 0632  WBC 9.2 7.4  NEUTROABS 8.2*  --   HGB 11.3* 11.8*  HCT 33.9* 34.2*  MCV 106.9* 103.6*  PLT PLATELET CLUMPS NOTED ON SMEAR, UNABLE TO ESTIMATE 209   Cardiac  Enzymes: No results for input(s): CKTOTAL, CKMB, CKMBINDEX, TROPONINI in the last 168 hours. BNP: Invalid input(s): POCBNP CBG: Recent Labs  Lab 06/21/18 1821  GLUCAP 154*   D-Dimer No results for input(s): DDIMER in the last 72 hours. Hgb A1c No results for input(s): HGBA1C in the last 72 hours. Lipid Profile No results for input(s): CHOL, HDL, LDLCALC, TRIG, CHOLHDL, LDLDIRECT in the last 72 hours. Thyroid function studies No results for input(s): TSH, T4TOTAL, T3FREE, THYROIDAB in the last 72 hours.  Invalid input(s): FREET3 Anemia work up No results for input(s): VITAMINB12, FOLATE, FERRITIN, TIBC, IRON, RETICCTPCT in the last 72 hours. Urinalysis    Component Value Date/Time   COLORURINE YELLOW 06/21/2018 1850   APPEARANCEUR HAZY (A) 06/21/2018 1850   LABSPEC 1.020 06/21/2018 1850   PHURINE 5.0 06/21/2018 1850   GLUCOSEU NEGATIVE 06/21/2018 1850   HGBUR NEGATIVE 06/21/2018 1850   BILIRUBINUR NEGATIVE 06/21/2018 1850   KETONESUR NEGATIVE 06/21/2018 1850   PROTEINUR NEGATIVE 06/21/2018 1850   UROBILINOGEN 0.2 03/30/2013 0519   NITRITE POSITIVE (A) 06/21/2018 1850   LEUKOCYTESUR TRACE (A) 06/21/2018 1850   Sepsis Labs Invalid input(s): PROCALCITONIN,  WBC,  LACTICIDVEN Microbiology Recent Results (from the past 240 hour(s))  Culture, blood (routine x 2) Call MD if unable to obtain prior to antibiotics being given     Status: None (Preliminary result)   Collection Time: 06/22/18 12:46 AM  Result Value Ref Range Status   Specimen Description   Final    BLOOD RIGHT HAND Performed at [facility], [address] [place], [place] [address]    Special Requests   Final    BOTTLES DRAWN AEROBIC ONLY Blood Culture results may not be optimal due to an inadequate volume of blood received in culture bottles Performed at [facility], [address] [place], [place] [address]    Culture   Final    NO GROWTH 2 DAYS Performed at [facility],  [address] [place], [place] [address]    Report Status PENDING  Incomplete  Culture, blood (routine x 2) Call MD if unable to obtain prior to antibiotics being given     Status: None (Preliminary result)   Collection Time: 06/22/18 10:24 AM  Result Value Ref Range Status   Specimen Description   Final    BLOOD LEFT HAND Performed at [facility] [address], [place], [place] [address]    Special Requests   Final    BOTTLES DRAWN AEROBIC ONLY Blood Culture adequate volume Performed at [facility] [address] [place], [place] [address]    Culture   Final    NO GROWTH 2 DAYS Performed at [facility] [address] [place], [place] [address]    Report Status PENDING  Incomplete     Time coordinating discharge in minutes: 59  SIGNED:   Debbe Odea, MD  [facility] 06/24/2018, 3:13 PM Pager   If 7PM-7AM, please contact night-coverage [email or web address] Password [email or web address]

## 2018-06-23 NOTE — Discharge Instructions (Signed)
Drink plenty of water. Ask your doctor to check your Bmet (blood work) on Friday please to see if your kidney function continues to improve.   You were cared for by a hospitalist during your hospital stay. If you have any questions about your discharge medications or the care you received while you were in the hospital after you are discharged, you can call the unit and asked to speak with the hospitalist on call if the hospitalist that took care of you is not available. Once you are discharged, your primary care physician will handle any further medical issues. Please note that NO REFILLS for any discharge medications will be authorized once you are discharged, as it is imperative that you return to your primary care physician (or establish a relationship with a primary care physician if you do not have one) for your aftercare needs so that they can reassess your need for medications and monitor your lab values.  Please take all your medications with you for your next visit with your Primary MD. Please ask your Primary MD to get all Hospital records sent to his/her office. Please request your Primary MD to go over all hospital test results at the follow up.    If you experience worsening of your admission symptoms, develop shortness of breath, chest pain, suicidal or homicidal thoughts or a life threatening emergency, you must seek medical attention immediately by calling 911 or calling your MD.   Dennis Bast must read the complete instructions/literature along with all the possible adverse reactions/side effects for all the medicines you take including new medications that have been prescribed to you. Take new medicines after you have completely understood and accpet all the possible adverse reactions/side effects.    Do not drive when taking pain medications or sedatives.     Do not take more than prescribed Pain, Sleep and Anxiety Medications   If you have smoked or chewed Tobacco in the last 2 yrs  please stop. Stop any regular alcohol  and or recreational drug use.   Wear Seat belts while driving.

## 2018-06-23 NOTE — Progress Notes (Signed)
PHARMACIST - PHYSICIAN COMMUNICATION DR:   Wynelle Cleveland CONCERNING: Antibiotic IV to Oral Route Change Policy  RECOMMENDATION: This patient is receiving azithromycin by the intravenous route.  Based on criteria approved by the Pharmacy and Therapeutics Committee, the antibiotic(s) is/are being converted to the equivalent oral dose form(s).   DESCRIPTION: These criteria include:  Patient being treated for a respiratory tract infection, urinary tract infection, cellulitis or clostridium difficile associated diarrhea if on metronidazole  The patient is not neutropenic and does not exhibit a GI malabsorption state  The patient is eating (either orally or via tube) and/or has been taking other orally administered medications for a least 24 hours  The patient is improving clinically and has a Tmax < 100.5  If you have questions about this conversion, please contact the Pharmacy Department   Theodis Shove, PharmD, BCPS Clinical Pharmacist Pager: [phone] 06/23/18 10:58 AM

## 2018-06-23 NOTE — Progress Notes (Signed)
Patient has discharged to home on 06/23/18. Discharge instruction including medication and appointment was given to patient. Patient has no question at this time.

## 2018-06-24 ENCOUNTER — Encounter (HOSPITAL_COMMUNITY): Payer: Medicare Other

## 2018-06-27 LAB — CULTURE, BLOOD (ROUTINE X 2)
CULTURE: NO GROWTH
Culture: NO GROWTH
Special Requests: ADEQUATE

## 2018-11-07 ENCOUNTER — Other Ambulatory Visit: Payer: Self-pay

## 2018-11-07 DIAGNOSIS — N185 Chronic kidney disease, stage 5: Secondary | ICD-10-CM

## 2018-11-12 ENCOUNTER — Encounter: Payer: Medicare Other | Admitting: Vascular Surgery

## 2018-11-12 ENCOUNTER — Encounter (HOSPITAL_COMMUNITY): Payer: Medicare Other

## 2018-11-12 ENCOUNTER — Other Ambulatory Visit (HOSPITAL_COMMUNITY): Payer: Medicare Other

## 2018-11-17 ENCOUNTER — Encounter: Payer: Self-pay | Admitting: Nurse Practitioner

## 2018-11-17 ENCOUNTER — Ambulatory Visit (INDEPENDENT_AMBULATORY_CARE_PROVIDER_SITE_OTHER): Payer: Medicare Other | Admitting: Nurse Practitioner

## 2018-11-17 ENCOUNTER — Other Ambulatory Visit: Payer: Self-pay | Admitting: Nurse Practitioner

## 2018-11-17 VITALS — BP 160/90 | HR 53 | Temp 97.4°F | Ht 63.0 in | Wt 103.4 lb

## 2018-11-17 DIAGNOSIS — H532 Diplopia: Secondary | ICD-10-CM

## 2018-11-17 DIAGNOSIS — I129 Hypertensive chronic kidney disease with stage 1 through stage 4 chronic kidney disease, or unspecified chronic kidney disease: Secondary | ICD-10-CM | POA: Diagnosis not present

## 2018-11-17 DIAGNOSIS — R220 Localized swelling, mass and lump, head: Secondary | ICD-10-CM

## 2018-11-17 DIAGNOSIS — N184 Chronic kidney disease, stage 4 (severe): Secondary | ICD-10-CM | POA: Diagnosis not present

## 2018-11-17 DIAGNOSIS — I10 Essential (primary) hypertension: Secondary | ICD-10-CM

## 2018-11-17 DIAGNOSIS — E039 Hypothyroidism, unspecified: Secondary | ICD-10-CM

## 2018-11-17 MED ORDER — AMLODIPINE BESYLATE 5 MG PO TABS: 5.0000 mg | ORAL_TABLET | Freq: Every day | ORAL | 0 refills | Status: DC

## 2018-11-17 MED ORDER — HYDROCHLOROTHIAZIDE 12.5 MG PO TABS: 12.5000 mg | ORAL_TABLET | Freq: Every day | ORAL | 1 refills | Status: DC

## 2018-11-17 NOTE — Progress Notes (Signed)
Subjective:     Patient ID: [name] Born , female    DOB: [birth year] , 72 y.o.   MRN: [id number]   Chief Complaint  Patient presents with  . Follow-up  . Diplopia    HPI  Thyroid Problem  Presents for follow-up visit. Symptoms include diaphoresis. Patient reports no anxiety, fatigue or palpitations.  Hypertension  This is a chronic problem. The current episode started more than 1 year ago. The problem has been gradually worsening since onset. The problem is uncontrolled. Pertinent negatives include no anxiety, malaise/fatigue, palpitations or peripheral edema. There are no known risk factors for coronary artery disease. Past treatments include nothing. There is no history of kidney disease or retinopathy. Identifiable causes of hypertension include chronic renal disease and a thyroid problem.     Past Medical History:  Diagnosis Date  . Hypertension   . Liver disease   . Renal disorder   . Thyroid disease      Family History  Problem Relation Age of Onset  . Hypertension Other   . Cervical cancer Mother   . Aneurysm Father      Current Outpatient Medications:  .  amLODipine (NORVASC) 5 MG tablet, Take 1 tablet (5 mg total) by mouth daily., Disp: 30 tablet, Rfl: 0 .  cefpodoxime (VANTIN) 200 MG tablet, Take 1 tablet (200 mg total) by mouth 2 (two) times daily., Disp: 11 tablet, Rfl: 0 .  dextromethorphan-guaiFENesin (MUCINEX DM) 30-600 MG 12hr tablet, Take 1 tablet by mouth 2 (two) times daily., Disp: 60 tablet, Rfl: 0 .  levothyroxine (SYNTHROID, LEVOTHROID) 75 MCG tablet, Take 1 tablet (75 mcg total) by mouth daily., Disp: 30 tablet, Rfl: 0 .  loperamide (IMODIUM) 2 MG capsule, Take 1 capsule (2 mg total) by mouth as needed for diarrhea or loose stools. Do not take more than 16 mg/day, Disp: 15 capsule, Rfl: 0 .  metoprolol succinate (TOPROL-XL) 100 MG 24 hr tablet, Take 1 tablet (100 mg total) by mouth daily. Take with or immediately following a meal., Disp: 90 tablet, Rfl:  3 .  senna-docusate (SENOKOT-S) 8.6-50 MG per tablet, Take 1 tablet by mouth daily., Disp: 7 tablet, Rfl: 0 .  sodium bicarbonate 650 MG tablet, Take 1 tablet (650 mg total) by mouth 3 (three) times daily., Disp: 60 tablet, Rfl: 0   No Known Allergies   Review of Systems  Constitutional: Positive for diaphoresis. Negative for fatigue and malaise/fatigue.  Cardiovascular: Negative for palpitations.  Psychiatric/Behavioral: The patient is not nervous/anxious.      Today's Vitals   11/17/18 1602  BP: (!) 160/90  Pulse: (!) 53  Temp: (!) 97.4 F (36.3 C)  TempSrc: Oral   There is no height or weight on file to calculate BMI.   Objective:  Physical Exam  Constitutional: She is oriented to person, place, and time. She appears well-developed and well-nourished.  HENT:  Facial swelling  Eyes: Pupils are equal, round, and reactive to light.  Cardiovascular: Normal rate, regular rhythm, normal heart sounds and intact distal pulses.  No murmur heard. Pulmonary/Chest: Effort normal and breath sounds normal.  Musculoskeletal: Normal range of motion. She exhibits no edema.  Neurological: She is alert and oriented to person, place, and time. No cranial nerve deficit.  Skin: Skin is warm and dry. Capillary refill takes less than 2 seconds.  Psychiatric: She has a normal mood and affect.  Vitals reviewed.        Assessment And Plan:     1. Hypothyroidism, unspecified  type  Chronic, controlled  Continue with current medications  - TSH - T3, free - T4  2. Essential hypertension  Chronic, poorly controlled, elevated today  She has not been taking her medications wanted to see if she would get better without the medications  Stressed to continue with current medications  - BMP8+eGFR - amLODipine (NORVASC) 5 MG tablet; Take 1 tablet (5 mg total) by mouth daily.  Dispense: 30 tablet; Refill: 0 - hydrochlorothiazide (HYDRODIURIL) 12.5 MG tablet; Take 1 tablet (12.5 mg total) by  mouth daily.  Dispense: 90 tablet; Refill: 1  3. CKD (chronic kidney disease) stage 4, GFR 15-29 ml/min (HCC)  - BMP8+eGFR  4. Facial swelling  Likely related to her not taking her diuretic daily vs hypothyroid - Brain natriuretic peptide  5. Diplopia  She has been having blurred vision  Elevated blood pressure vs hypothyroid vs vision disturbances - Ambulatory referral to Ophthalmology   Minette Brine, FNP

## 2018-11-17 NOTE — Patient Instructions (Addendum)
   Restart your amlodipine and HCTZ daily  We will check your thyroid levels before starting your levothyroxine   I have referred you to an eye doctor for the blurred vision  Take Bayer or baby aspirin 81 mg daily.

## 2018-11-18 ENCOUNTER — Other Ambulatory Visit: Payer: Self-pay

## 2018-11-18 ENCOUNTER — Telehealth: Payer: Self-pay

## 2018-11-18 LAB — BMP8+EGFR
BUN / CREAT RATIO: 11 — AB (ref 12–28)
BUN: 33 mg/dL — ABNORMAL HIGH (ref 8–27)
CO2: 11 mmol/L — AB (ref 20–29)
Calcium: 9 mg/dL (ref 8.7–10.3)
Chloride: 111 mmol/L — ABNORMAL HIGH (ref 96–106)
Creatinine, Ser: 2.91 mg/dL — ABNORMAL HIGH (ref 0.57–1.00)
GFR calc Af Amer: 18 mL/min/{1.73_m2} — ABNORMAL LOW (ref >59)
GFR calc non Af Amer: 15 mL/min/{1.73_m2} — ABNORMAL LOW (ref >59)
GLUCOSE: 88 mg/dL (ref 65–99)
POTASSIUM: 4.6 mmol/L (ref 3.5–5.2)
SODIUM: 141 mmol/L (ref 134–144)

## 2018-11-18 LAB — T4: T4, Total: 0.4 ug/dL — CL (ref 4.5–12.0)

## 2018-11-18 LAB — T3, FREE

## 2018-11-18 LAB — TSH: TSH: 128.3 u[IU]/mL — AB (ref 0.450–4.500)

## 2018-11-18 LAB — PRO B NATRIURETIC PEPTIDE: NT-Pro BNP: 888 pg/mL — ABNORMAL HIGH (ref 0–301)

## 2018-11-18 MED ORDER — LEVOTHYROXINE SODIUM 75 MCG PO TABS: ORAL_TABLET | ORAL | 1 refills | Status: DC

## 2018-11-18 NOTE — Telephone Encounter (Signed)
The pt's daughter Leandro Reasoner was notified of the pt's lab results and was told that Dr. Baird Cancer wanted to know if the pt was taking her med and the daughter said no because the pt wanted to see how she would do without being on the medication and that is why she brought her mother to the dr because she found out that the pt hadn't been taking her medication.  Lavonda the pt's daughter was told that dr sanders said that it's important for the pt to take her thyroid medication because she is in heart failure and has stage 3 chronic kidney disease.  She was told that her mom needed to take 1/2 tab of levothyroine 75 mcg daily and keep her appt scheduled for Dec. 30th.

## 2018-12-22 ENCOUNTER — Ambulatory Visit: Payer: Medicare Other | Admitting: Nurse Practitioner

## 2018-12-25 ENCOUNTER — Ambulatory Visit (INDEPENDENT_AMBULATORY_CARE_PROVIDER_SITE_OTHER): Payer: Medicare Other | Admitting: Nurse Practitioner

## 2018-12-25 ENCOUNTER — Encounter (INDEPENDENT_AMBULATORY_CARE_PROVIDER_SITE_OTHER): Payer: Self-pay

## 2018-12-25 ENCOUNTER — Encounter: Payer: Self-pay | Admitting: Nurse Practitioner

## 2018-12-25 VITALS — BP 142/80 | HR 97 | Temp 97.5°F | Ht 61.2 in | Wt 105.4 lb

## 2018-12-25 DIAGNOSIS — I10 Essential (primary) hypertension: Secondary | ICD-10-CM

## 2018-12-25 DIAGNOSIS — I110 Hypertensive heart disease with heart failure: Secondary | ICD-10-CM

## 2018-12-25 DIAGNOSIS — I509 Heart failure, unspecified: Secondary | ICD-10-CM

## 2018-12-25 DIAGNOSIS — E039 Hypothyroidism, unspecified: Secondary | ICD-10-CM

## 2018-12-25 NOTE — Progress Notes (Signed)
Subjective:     Patient ID: [name] , female    DOB: 11/27/1946 , 73 y.o.   MRN: [id number]   Chief Complaint  Patient presents with  . Hypertension  . Hypothyroidism    HPI  Hypertension  This is a chronic problem. The current episode started more than 1 year ago. The problem has been gradually improving since onset. The problem is uncontrolled. Pertinent negatives include no anxiety, blurred vision or headaches. Risk factors for coronary artery disease include sedentary lifestyle. Past treatments include diuretics and ACE inhibitors. The current treatment provides moderate improvement. Compliance problems: she is now taking her medications more regularly.  There is no history of angina. Identifiable causes of hypertension include chronic renal disease and a thyroid problem.  Thyroid Problem  Presents for follow-up visit. Patient reports no anxiety, fatigue or weight loss. The symptoms have been worsening.     Past Medical History:  Diagnosis Date  . Hypertension   . Liver disease   . Renal disorder   . Thyroid disease      Family History  Problem Relation Age of Onset  . Hypertension Other   . Cervical cancer Mother   . Aneurysm Father      Current Outpatient Medications:  .  amLODipine (NORVASC) 5 MG tablet, TAKE 1 TABLET(5 MG) BY MOUTH DAILY, Disp: 90 tablet, Rfl: 1 .  sodium bicarbonate 650 MG tablet, Take 1 tablet (650 mg total) by mouth 3 (three) times daily. (Patient taking differently: Take 650 mg by mouth 2 (two) times daily. ), Disp: 60 tablet, Rfl: 0   No Known Allergies   Review of Systems  Constitutional: Negative for fatigue and weight loss.  Eyes: Negative for blurred vision and visual disturbance.  Respiratory: Negative.   Cardiovascular: Negative.   Gastrointestinal: Negative.   Endocrine: Negative.  Negative for polydipsia, polyphagia and polyuria.  Skin: Negative.   Neurological: Negative for dizziness and headaches.  Psychiatric/Behavioral:  Negative for confusion. The patient is not nervous/anxious.      Today's Vitals   12/25/18 1144  BP: (!) 142/80  Pulse: 97  Temp: (!) 97.5 F (36.4 C)  TempSrc: Oral  SpO2: 98%  Weight: 105 lb 6.4 oz (47.8 kg)  Height: 5' 1.2" (1.554 m)  PainSc: 0-No pain   Body mass index is 19.79 kg/m.   Objective:  Physical Exam Vitals signs reviewed.  Constitutional:      Appearance: Normal appearance. She is well-developed.  HENT:     Head: Normocephalic and atraumatic.  Eyes:     Pupils: Pupils are equal, round, and reactive to light.  Cardiovascular:     Rate and Rhythm: Normal rate and regular rhythm.     Pulses: Normal pulses.     Heart sounds: Normal heart sounds. No murmur.  Pulmonary:     Effort: Pulmonary effort is normal.     Breath sounds: Normal breath sounds.  Musculoskeletal:        General: Tenderness: cervical and low back.  Skin:    General: Skin is warm and dry.     Capillary Refill: Capillary refill takes less than 2 seconds.  Neurological:     General: No focal deficit present.     Mental Status: She is alert and oriented to person, place, and time.     Cranial Nerves: No cranial nerve deficit.  Psychiatric:        Mood and Affect: Mood normal.         Assessment And Plan:  1. Hypothyroidism, unspecified type  Chronic, poorly controlled, she had not been taking her medications  Will recheck her thyroid levels today  Continue with current medications pending lab results  - TSH - T3 - T4, Free  2. Essential hypertension . B/P is slightly elevated today, I will not make any changes at this time . Encouraged to stay well hydrated and limit intake of salt.  . CMP ordered to check renal function.  . The importance of dietary modification was stressed to the patient.  - CBC with Diff - BMP8+eGFR  3. Chronic congestive heart failure, unspecified heart failure type (HCC)  Chronic, fair control  Her face is not as swollen as her previous  visit  Continue with current medications - Brain natriuretic peptide       Minette Brine, FNP

## 2018-12-26 ENCOUNTER — Encounter: Payer: Self-pay | Admitting: Vascular Surgery

## 2018-12-26 ENCOUNTER — Inpatient Hospital Stay (HOSPITAL_COMMUNITY): Admission: RE | Admit: 2018-12-26 | Payer: Medicare Other | Source: Ambulatory Visit

## 2018-12-26 ENCOUNTER — Other Ambulatory Visit (HOSPITAL_COMMUNITY): Payer: Medicare Other

## 2018-12-26 ENCOUNTER — Encounter: Payer: Medicare Other | Admitting: Vascular Surgery

## 2018-12-26 LAB — BMP8+EGFR
BUN/Creatinine Ratio: 14 (ref 12–28)
BUN: 28 mg/dL — AB (ref 8–27)
CALCIUM: 9.2 mg/dL (ref 8.7–10.3)
CHLORIDE: 110 mmol/L — AB (ref 96–106)
CO2: 13 mmol/L — AB (ref 20–29)
CREATININE: 1.95 mg/dL — AB (ref 0.57–1.00)
GFR calc Af Amer: 29 mL/min/{1.73_m2} — ABNORMAL LOW (ref >59)
GFR calc non Af Amer: 25 mL/min/{1.73_m2} — ABNORMAL LOW (ref >59)
GLUCOSE: 100 mg/dL — AB (ref 65–99)
Potassium: 5 mmol/L (ref 3.5–5.2)
Sodium: 139 mmol/L (ref 134–144)

## 2018-12-26 LAB — CBC WITH DIFFERENTIAL/PLATELET
BASOS ABS: 0 10*3/uL (ref 0.0–0.2)
BASOS: 1 %
EOS (ABSOLUTE): 0 10*3/uL (ref 0.0–0.4)
Eos: 0 %
HEMATOCRIT: 27.4 % — AB (ref 34.0–46.6)
Hemoglobin: 9.7 g/dL — ABNORMAL LOW (ref 11.1–15.9)
IMMATURE GRANS (ABS): 0 10*3/uL (ref 0.0–0.1)
Immature Granulocytes: 1 %
LYMPHS ABS: 0.7 10*3/uL (ref 0.7–3.1)
Lymphs: 17 %
MCH: 42.7 pg — AB (ref 26.6–33.0)
MCHC: 35.4 g/dL (ref 31.5–35.7)
MCV: 121 fL — AB (ref 79–97)
MONOCYTES: 14 %
Monocytes Absolute: 0.6 10*3/uL (ref 0.1–0.9)
NEUTROS ABS: 2.9 10*3/uL (ref 1.4–7.0)
Neutrophils: 67 %
PLATELETS: 133 10*3/uL — AB (ref 150–450)
RBC: 2.27 x10E6/uL — CL (ref 3.77–5.28)
RDW: 16.8 % — ABNORMAL HIGH (ref 12.3–15.4)
WBC: 4.2 10*3/uL (ref 3.4–10.8)

## 2018-12-26 LAB — TSH: TSH: 2.81 u[IU]/mL (ref 0.450–4.500)

## 2018-12-26 LAB — T3: T3, Total: 79 ng/dL (ref 71–180)

## 2018-12-26 LAB — PRO B NATRIURETIC PEPTIDE: NT-PRO BNP: 598 pg/mL — AB (ref 0–301)

## 2018-12-26 LAB — T4, FREE: FREE T4: 1.2 ng/dL (ref 0.82–1.77)

## 2019-01-02 ENCOUNTER — Telehealth: Payer: Self-pay | Admitting: Nurse Practitioner

## 2019-01-07 NOTE — Telephone Encounter (Signed)
Attempted calling Leandro Reasoner the patient's daughter twice without an answer.  Will attempt calling again later today

## 2019-01-14 ENCOUNTER — Other Ambulatory Visit: Payer: Self-pay | Admitting: Nurse Practitioner

## 2019-01-14 DIAGNOSIS — D649 Anemia, unspecified: Secondary | ICD-10-CM

## 2019-01-14 DIAGNOSIS — Z1211 Encounter for screening for malignant neoplasm of colon: Secondary | ICD-10-CM

## 2019-01-14 NOTE — Progress Notes (Signed)
When people have low hemoglobin we like to check to see if losing blood in stool, I will look to see if she has had a colonoscopy recently.  It is recommended for her to take iron supplement because it has dropped again

## 2019-01-15 NOTE — Progress Notes (Signed)
I have placed a referral for GI and she does need to take an iron supplement because her oxygen level and RBC level is low before she needs another blood transfusion

## 2019-01-22 ENCOUNTER — Other Ambulatory Visit: Payer: Self-pay

## 2019-01-22 MED ORDER — LEVOTHYROXINE SODIUM 75 MCG PO TABS
75.0000 ug | ORAL_TABLET | Freq: Every day | ORAL | 1 refills | Status: DC
Start: 2019-01-22 — End: 2019-01-26

## 2019-01-26 ENCOUNTER — Other Ambulatory Visit: Payer: Self-pay | Admitting: Nurse Practitioner

## 2019-01-26 DIAGNOSIS — E039 Hypothyroidism, unspecified: Secondary | ICD-10-CM

## 2019-01-26 MED ORDER — LEVOTHYROXINE SODIUM 75 MCG PO TABS: ORAL_TABLET | ORAL | 1 refills | Status: AC

## 2019-02-24 ENCOUNTER — Encounter (HOSPITAL_COMMUNITY): Payer: Self-pay

## 2019-02-24 ENCOUNTER — Inpatient Hospital Stay (HOSPITAL_COMMUNITY): Payer: Medicare Other

## 2019-02-24 ENCOUNTER — Other Ambulatory Visit: Payer: Self-pay

## 2019-02-24 ENCOUNTER — Inpatient Hospital Stay (HOSPITAL_COMMUNITY)
Admission: EM | Admit: 2019-02-24 | Discharge: 2019-03-25 | DRG: 870 | Disposition: E | Payer: Medicare Other | Attending: Pulmonary Disease | Admitting: Pulmonary Disease

## 2019-02-24 ENCOUNTER — Emergency Department (HOSPITAL_COMMUNITY): Payer: Medicare Other

## 2019-02-24 DIAGNOSIS — H9190 Unspecified hearing loss, unspecified ear: Secondary | ICD-10-CM | POA: Diagnosis present

## 2019-02-24 DIAGNOSIS — E162 Hypoglycemia, unspecified: Secondary | ICD-10-CM | POA: Diagnosis present

## 2019-02-24 DIAGNOSIS — J690 Pneumonitis due to inhalation of food and vomit: Secondary | ICD-10-CM | POA: Diagnosis not present

## 2019-02-24 DIAGNOSIS — K7211 Chronic hepatic failure with coma: Secondary | ICD-10-CM

## 2019-02-24 DIAGNOSIS — R7989 Other specified abnormal findings of blood chemistry: Secondary | ICD-10-CM | POA: Diagnosis not present

## 2019-02-24 DIAGNOSIS — E871 Hypo-osmolality and hyponatremia: Secondary | ICD-10-CM | POA: Diagnosis not present

## 2019-02-24 DIAGNOSIS — I10 Essential (primary) hypertension: Secondary | ICD-10-CM | POA: Diagnosis not present

## 2019-02-24 DIAGNOSIS — E876 Hypokalemia: Secondary | ICD-10-CM

## 2019-02-24 DIAGNOSIS — R740 Nonspecific elevation of levels of transaminase and lactic acid dehydrogenase [LDH]: Secondary | ICD-10-CM

## 2019-02-24 DIAGNOSIS — B9561 Methicillin susceptible Staphylococcus aureus infection as the cause of diseases classified elsewhere: Secondary | ICD-10-CM

## 2019-02-24 DIAGNOSIS — Z87891 Personal history of nicotine dependence: Secondary | ICD-10-CM

## 2019-02-24 DIAGNOSIS — Z4659 Encounter for fitting and adjustment of other gastrointestinal appliance and device: Secondary | ICD-10-CM

## 2019-02-24 DIAGNOSIS — E039 Hypothyroidism, unspecified: Secondary | ICD-10-CM | POA: Diagnosis present

## 2019-02-24 DIAGNOSIS — I700 Atherosclerosis of aorta: Secondary | ICD-10-CM | POA: Diagnosis present

## 2019-02-24 DIAGNOSIS — E872 Acidosis: Secondary | ICD-10-CM | POA: Diagnosis present

## 2019-02-24 DIAGNOSIS — E870 Hyperosmolality and hypernatremia: Secondary | ICD-10-CM | POA: Diagnosis not present

## 2019-02-24 DIAGNOSIS — K1120 Sialoadenitis, unspecified: Secondary | ICD-10-CM | POA: Diagnosis present

## 2019-02-24 DIAGNOSIS — T17908A Unspecified foreign body in respiratory tract, part unspecified causing other injury, initial encounter: Secondary | ICD-10-CM

## 2019-02-24 DIAGNOSIS — N189 Chronic kidney disease, unspecified: Secondary | ICD-10-CM | POA: Diagnosis not present

## 2019-02-24 DIAGNOSIS — Z79899 Other long term (current) drug therapy: Secondary | ICD-10-CM

## 2019-02-24 DIAGNOSIS — E860 Dehydration: Secondary | ICD-10-CM | POA: Diagnosis present

## 2019-02-24 DIAGNOSIS — N3090 Cystitis, unspecified without hematuria: Secondary | ICD-10-CM | POA: Diagnosis not present

## 2019-02-24 DIAGNOSIS — Z978 Presence of other specified devices: Secondary | ICD-10-CM

## 2019-02-24 DIAGNOSIS — D539 Nutritional anemia, unspecified: Secondary | ICD-10-CM | POA: Diagnosis present

## 2019-02-24 DIAGNOSIS — R451 Restlessness and agitation: Secondary | ICD-10-CM | POA: Diagnosis not present

## 2019-02-24 DIAGNOSIS — K7200 Acute and subacute hepatic failure without coma: Secondary | ICD-10-CM | POA: Diagnosis present

## 2019-02-24 DIAGNOSIS — R680 Hypothermia, not associated with low environmental temperature: Secondary | ICD-10-CM | POA: Diagnosis present

## 2019-02-24 DIAGNOSIS — E43 Unspecified severe protein-calorie malnutrition: Secondary | ICD-10-CM | POA: Diagnosis present

## 2019-02-24 DIAGNOSIS — G9340 Encephalopathy, unspecified: Secondary | ICD-10-CM | POA: Diagnosis not present

## 2019-02-24 DIAGNOSIS — D631 Anemia in chronic kidney disease: Secondary | ICD-10-CM | POA: Diagnosis present

## 2019-02-24 DIAGNOSIS — Z7189 Other specified counseling: Secondary | ICD-10-CM | POA: Diagnosis not present

## 2019-02-24 DIAGNOSIS — R4182 Altered mental status, unspecified: Secondary | ICD-10-CM | POA: Diagnosis present

## 2019-02-24 DIAGNOSIS — Z8249 Family history of ischemic heart disease and other diseases of the circulatory system: Secondary | ICD-10-CM

## 2019-02-24 DIAGNOSIS — J9601 Acute respiratory failure with hypoxia: Secondary | ICD-10-CM | POA: Diagnosis not present

## 2019-02-24 DIAGNOSIS — D72829 Elevated white blood cell count, unspecified: Secondary | ICD-10-CM | POA: Diagnosis not present

## 2019-02-24 DIAGNOSIS — D65 Disseminated intravascular coagulation [defibrination syndrome]: Secondary | ICD-10-CM | POA: Diagnosis not present

## 2019-02-24 DIAGNOSIS — R1011 Right upper quadrant pain: Secondary | ICD-10-CM

## 2019-02-24 DIAGNOSIS — J9621 Acute and chronic respiratory failure with hypoxia: Secondary | ICD-10-CM

## 2019-02-24 DIAGNOSIS — D509 Iron deficiency anemia, unspecified: Secondary | ICD-10-CM | POA: Diagnosis present

## 2019-02-24 DIAGNOSIS — R7401 Elevation of levels of liver transaminase levels: Secondary | ICD-10-CM

## 2019-02-24 DIAGNOSIS — A4101 Sepsis due to Methicillin susceptible Staphylococcus aureus: Principal | ICD-10-CM | POA: Diagnosis present

## 2019-02-24 DIAGNOSIS — R188 Other ascites: Secondary | ICD-10-CM | POA: Diagnosis present

## 2019-02-24 DIAGNOSIS — E861 Hypovolemia: Secondary | ICD-10-CM | POA: Diagnosis not present

## 2019-02-24 DIAGNOSIS — N179 Acute kidney failure, unspecified: Secondary | ICD-10-CM | POA: Diagnosis present

## 2019-02-24 DIAGNOSIS — R7881 Bacteremia: Secondary | ICD-10-CM | POA: Diagnosis not present

## 2019-02-24 DIAGNOSIS — J189 Pneumonia, unspecified organism: Secondary | ICD-10-CM | POA: Diagnosis not present

## 2019-02-24 DIAGNOSIS — R6521 Severe sepsis with septic shock: Secondary | ICD-10-CM | POA: Diagnosis present

## 2019-02-24 DIAGNOSIS — D696 Thrombocytopenia, unspecified: Secondary | ICD-10-CM | POA: Diagnosis not present

## 2019-02-24 DIAGNOSIS — R739 Hyperglycemia, unspecified: Secondary | ICD-10-CM | POA: Diagnosis not present

## 2019-02-24 DIAGNOSIS — K7460 Unspecified cirrhosis of liver: Secondary | ICD-10-CM | POA: Diagnosis present

## 2019-02-24 DIAGNOSIS — N184 Chronic kidney disease, stage 4 (severe): Secondary | ICD-10-CM | POA: Diagnosis present

## 2019-02-24 DIAGNOSIS — Z9911 Dependence on respirator [ventilator] status: Secondary | ICD-10-CM | POA: Diagnosis not present

## 2019-02-24 DIAGNOSIS — E7220 Disorder of urea cycle metabolism, unspecified: Secondary | ICD-10-CM | POA: Diagnosis not present

## 2019-02-24 DIAGNOSIS — Z6821 Body mass index (BMI) 21.0-21.9, adult: Secondary | ICD-10-CM | POA: Diagnosis not present

## 2019-02-24 DIAGNOSIS — Z9621 Cochlear implant status: Secondary | ICD-10-CM | POA: Diagnosis present

## 2019-02-24 DIAGNOSIS — I129 Hypertensive chronic kidney disease with stage 1 through stage 4 chronic kidney disease, or unspecified chronic kidney disease: Secondary | ICD-10-CM | POA: Diagnosis present

## 2019-02-24 DIAGNOSIS — K7210 Chronic hepatic failure without coma: Secondary | ICD-10-CM | POA: Diagnosis present

## 2019-02-24 DIAGNOSIS — N816 Rectocele: Secondary | ICD-10-CM | POA: Diagnosis present

## 2019-02-24 DIAGNOSIS — Z515 Encounter for palliative care: Secondary | ICD-10-CM | POA: Diagnosis not present

## 2019-02-24 DIAGNOSIS — I361 Nonrheumatic tricuspid (valve) insufficiency: Secondary | ICD-10-CM | POA: Diagnosis not present

## 2019-02-24 DIAGNOSIS — R0602 Shortness of breath: Secondary | ICD-10-CM

## 2019-02-24 DIAGNOSIS — J9690 Respiratory failure, unspecified, unspecified whether with hypoxia or hypercapnia: Secondary | ICD-10-CM

## 2019-02-24 DIAGNOSIS — Z96651 Presence of right artificial knee joint: Secondary | ICD-10-CM | POA: Diagnosis not present

## 2019-02-24 DIAGNOSIS — D6489 Other specified anemias: Secondary | ICD-10-CM | POA: Diagnosis present

## 2019-02-24 DIAGNOSIS — I708 Atherosclerosis of other arteries: Secondary | ICD-10-CM | POA: Diagnosis present

## 2019-02-24 DIAGNOSIS — Z8049 Family history of malignant neoplasm of other genital organs: Secondary | ICD-10-CM

## 2019-02-24 DIAGNOSIS — B9620 Unspecified Escherichia coli [E. coli] as the cause of diseases classified elsewhere: Secondary | ICD-10-CM | POA: Diagnosis not present

## 2019-02-24 DIAGNOSIS — Z452 Encounter for adjustment and management of vascular access device: Secondary | ICD-10-CM

## 2019-02-24 DIAGNOSIS — E8770 Fluid overload, unspecified: Secondary | ICD-10-CM | POA: Diagnosis present

## 2019-02-24 DIAGNOSIS — R52 Pain, unspecified: Secondary | ICD-10-CM

## 2019-02-24 DIAGNOSIS — G9341 Metabolic encephalopathy: Secondary | ICD-10-CM | POA: Diagnosis not present

## 2019-02-24 DIAGNOSIS — Z79890 Hormone replacement therapy (postmenopausal): Secondary | ICD-10-CM

## 2019-02-24 DIAGNOSIS — A419 Sepsis, unspecified organism: Secondary | ICD-10-CM | POA: Diagnosis present

## 2019-02-24 DIAGNOSIS — Z66 Do not resuscitate: Secondary | ICD-10-CM | POA: Diagnosis present

## 2019-02-24 DIAGNOSIS — Z9289 Personal history of other medical treatment: Secondary | ICD-10-CM

## 2019-02-24 DIAGNOSIS — R54 Age-related physical debility: Secondary | ICD-10-CM | POA: Diagnosis present

## 2019-02-24 DIAGNOSIS — E8729 Other acidosis: Secondary | ICD-10-CM

## 2019-02-24 DIAGNOSIS — E875 Hyperkalemia: Secondary | ICD-10-CM | POA: Diagnosis not present

## 2019-02-24 DIAGNOSIS — Z825 Family history of asthma and other chronic lower respiratory diseases: Secondary | ICD-10-CM

## 2019-02-24 HISTORY — DX: Anemia, unspecified: D64.9

## 2019-02-24 LAB — COMPREHENSIVE METABOLIC PANEL
ALT: 35 U/L (ref 0–44)
ALT: 35 U/L (ref 0–44)
AST: 64 U/L — AB (ref 15–41)
AST: 68 U/L — ABNORMAL HIGH (ref 15–41)
Albumin: 2.9 g/dL — ABNORMAL LOW (ref 3.5–5.0)
Albumin: 2.8 g/dL — ABNORMAL LOW (ref 3.5–5.0)
Alkaline Phosphatase: 114 U/L (ref 38–126)
Alkaline Phosphatase: 112 U/L (ref 38–126)
Anion gap: 18 — ABNORMAL HIGH (ref 5–15)
Anion gap: 15 (ref 5–15)
BUN: 50 mg/dL — AB (ref 8–23)
BUN: 46 mg/dL — ABNORMAL HIGH (ref 8–23)
CO2: 15 mmol/L — ABNORMAL LOW (ref 22–32)
CO2: 14 mmol/L — ABNORMAL LOW (ref 22–32)
Calcium: 8.3 mg/dL — ABNORMAL LOW (ref 8.9–10.3)
Calcium: 7.8 mg/dL — ABNORMAL LOW (ref 8.9–10.3)
Chloride: 103 mmol/L (ref 98–111)
Chloride: 102 mmol/L (ref 98–111)
Creatinine, Ser: 3.37 mg/dL — ABNORMAL HIGH (ref 0.44–1.00)
Creatinine, Ser: 3.11 mg/dL — ABNORMAL HIGH (ref 0.44–1.00)
GFR calc Af Amer: 15 mL/min — ABNORMAL LOW (ref >60)
GFR calc Af Amer: 16 mL/min — ABNORMAL LOW (ref >60)
GFR calc non Af Amer: 13 mL/min — ABNORMAL LOW (ref >60)
GFR calc non Af Amer: 14 mL/min — ABNORMAL LOW (ref >60)
GLUCOSE: 120 mg/dL — AB (ref 70–99)
Glucose, Bld: 79 mg/dL (ref 70–99)
Potassium: 2.6 mmol/L — CL (ref 3.5–5.1)
Potassium: 3.4 mmol/L — ABNORMAL LOW (ref 3.5–5.1)
Sodium: 136 mmol/L (ref 135–145)
Sodium: 131 mmol/L — ABNORMAL LOW (ref 135–145)
Total Bilirubin: 3 mg/dL — ABNORMAL HIGH (ref 0.3–1.2)
Total Bilirubin: 3.2 mg/dL — ABNORMAL HIGH (ref 0.3–1.2)
Total Protein: 5.5 g/dL — ABNORMAL LOW (ref 6.5–8.1)
Total Protein: 5.2 g/dL — ABNORMAL LOW (ref 6.5–8.1)

## 2019-02-24 LAB — URINALYSIS, COMPLETE (UACMP) WITH MICROSCOPIC
Bilirubin Urine: NEGATIVE
Glucose, UA: NEGATIVE mg/dL
Ketones, ur: NEGATIVE mg/dL
Leukocytes,Ua: NEGATIVE
Nitrite: NEGATIVE
Protein, ur: NEGATIVE mg/dL
Specific Gravity, Urine: 1.018 (ref 1.005–1.030)
pH: 5 (ref 5.0–8.0)

## 2019-02-24 LAB — CBC WITH DIFFERENTIAL/PLATELET
Abs Immature Granulocytes: 0.14 10*3/uL — ABNORMAL HIGH (ref 0.00–0.07)
Abs Immature Granulocytes: 0.12 10*3/uL — ABNORMAL HIGH (ref 0.00–0.07)
Basophils Absolute: 0.1 10*3/uL (ref 0.0–0.1)
Basophils Absolute: 0 10*3/uL (ref 0.0–0.1)
Basophils Relative: 0 %
Basophils Relative: 0 %
Eosinophils Absolute: 0.4 10*3/uL (ref 0.0–0.5)
Eosinophils Absolute: 0.5 10*3/uL (ref 0.0–0.5)
Eosinophils Relative: 3 %
Eosinophils Relative: 3 %
HCT: 25.8 % — ABNORMAL LOW (ref 36.0–46.0)
HEMATOCRIT: 24 % — AB (ref 36.0–46.0)
Hemoglobin: 8.4 g/dL — ABNORMAL LOW (ref 12.0–15.0)
Hemoglobin: 8.1 g/dL — ABNORMAL LOW (ref 12.0–15.0)
Immature Granulocytes: 1 %
Immature Granulocytes: 1 %
LYMPHS PCT: 4 %
Lymphocytes Relative: 4 %
Lymphs Abs: 0.6 10*3/uL — ABNORMAL LOW (ref 0.7–4.0)
Lymphs Abs: 0.7 10*3/uL (ref 0.7–4.0)
MCH: 42 pg — AB (ref 26.0–34.0)
MCH: 43.5 pg — ABNORMAL HIGH (ref 26.0–34.0)
MCHC: 32.6 g/dL (ref 30.0–36.0)
MCHC: 33.8 g/dL (ref 30.0–36.0)
MCV: 129 fL — ABNORMAL HIGH (ref 80.0–100.0)
MCV: 129 fL — ABNORMAL HIGH (ref 80.0–100.0)
Monocytes Absolute: 1.9 10*3/uL — ABNORMAL HIGH (ref 0.1–1.0)
Monocytes Absolute: 2.5 10*3/uL — ABNORMAL HIGH (ref 0.1–1.0)
Monocytes Relative: 13 %
Monocytes Relative: 16 %
Neutro Abs: 11.4 10*3/uL — ABNORMAL HIGH (ref 1.7–7.7)
Neutro Abs: 11.5 10*3/uL — ABNORMAL HIGH (ref 1.7–7.7)
Neutrophils Relative %: 79 %
Neutrophils Relative %: 76 %
Platelets: DECREASED 10*3/uL (ref 150–400)
Platelets: 53 10*3/uL — ABNORMAL LOW (ref 150–400)
RBC: 2 MIL/uL — ABNORMAL LOW (ref 3.87–5.11)
RBC: 1.86 MIL/uL — ABNORMAL LOW (ref 3.87–5.11)
RDW: 25.2 % — AB (ref 11.5–15.5)
RDW: 25.9 % — AB (ref 11.5–15.5)
WBC: 14.4 10*3/uL — ABNORMAL HIGH (ref 4.0–10.5)
WBC: 15.3 10*3/uL — AB (ref 4.0–10.5)
nRBC: 1.2 % — ABNORMAL HIGH (ref 0.0–0.2)
nRBC: 1 % — ABNORMAL HIGH (ref 0.0–0.2)

## 2019-02-24 LAB — AMMONIA: AMMONIA: 81 umol/L — AB (ref 9–35)

## 2019-02-24 LAB — SAVE SMEAR(SSMR), FOR PROVIDER SLIDE REVIEW

## 2019-02-24 LAB — TSH: TSH: 4.124 u[IU]/mL (ref 0.350–4.500)

## 2019-02-24 LAB — RAPID URINE DRUG SCREEN, HOSP PERFORMED
Amphetamines: NOT DETECTED
Barbiturates: NOT DETECTED
Benzodiazepines: NOT DETECTED
Cocaine: NOT DETECTED
OPIATES: NOT DETECTED
Tetrahydrocannabinol: NOT DETECTED

## 2019-02-24 LAB — PROCALCITONIN: Procalcitonin: 99.43 ng/mL

## 2019-02-24 LAB — LACTATE DEHYDROGENASE: LDH: 482 U/L — ABNORMAL HIGH (ref 98–192)

## 2019-02-24 LAB — TROPONIN I: Troponin I: 0.04 ng/mL (ref <0.03)

## 2019-02-24 LAB — LACTIC ACID, PLASMA
LACTIC ACID, VENOUS: 7.1 mmol/L — AB (ref 0.5–1.9)
Lactic Acid, Venous: 7.2 mmol/L (ref 0.5–1.9)

## 2019-02-24 LAB — T4, FREE: Free T4: 1.48 ng/dL (ref 0.82–1.77)

## 2019-02-24 LAB — CBG MONITORING, ED
Glucose-Capillary: 157 mg/dL — ABNORMAL HIGH (ref 70–99)
Glucose-Capillary: 82 mg/dL (ref 70–99)

## 2019-02-24 LAB — NA AND K (SODIUM & POTASSIUM), RAND UR
Potassium Urine: 25 mmol/L
Sodium, Ur: <10 mmol/L

## 2019-02-24 LAB — MAGNESIUM: Magnesium: 1.8 mg/dL (ref 1.7–2.4)

## 2019-02-24 LAB — PHOSPHORUS: Phosphorus: 5.1 mg/dL — ABNORMAL HIGH (ref 2.5–4.6)

## 2019-02-24 MED ORDER — POTASSIUM CHLORIDE 10 MEQ/100ML IV SOLN: 10.0000 meq | INTRAVENOUS | Status: DC

## 2019-02-24 MED ORDER — POTASSIUM CHLORIDE 20 MEQ/15ML (10%) PO SOLN
40.0000 meq | Freq: Once | ORAL | Status: AC
  Administered 2019-02-24: 40 meq via ORAL
  Filled 2019-02-24: qty 30

## 2019-02-24 MED ORDER — SODIUM BICARBONATE 8.4 % IV SOLN
INTRAVENOUS | Status: DC
  Filled 2019-02-24: qty 150

## 2019-02-24 MED ORDER — SODIUM CHLORIDE 0.9 % IV BOLUS
1000.0000 mL | Freq: Once | INTRAVENOUS | Status: AC
  Administered 2019-02-24: 1000 mL via INTRAVENOUS

## 2019-02-24 MED ORDER — ACETAMINOPHEN 325 MG PO TABS: 650.0000 mg | ORAL_TABLET | Freq: Four times a day (QID) | ORAL | Status: DC | PRN

## 2019-02-24 MED ORDER — SODIUM CHLORIDE 0.9 % IV SOLN
INTRAVENOUS | Status: DC
  Administered 2019-02-25: 01:00:00 via INTRAVENOUS

## 2019-02-24 MED ORDER — VANCOMYCIN HCL IN DEXTROSE 1-5 GM/200ML-% IV SOLN
1000.0000 mg | Freq: Once | INTRAVENOUS | Status: AC
  Administered 2019-02-24: 1000 mg via INTRAVENOUS
  Filled 2019-02-24: qty 200

## 2019-02-24 MED ORDER — ONDANSETRON HCL 4 MG/2ML IJ SOLN
4.0000 mg | Freq: Four times a day (QID) | INTRAMUSCULAR | Status: DC | PRN
  Administered 2019-03-02 – 2019-03-04 (×3): 4 mg via INTRAVENOUS
  Filled 2019-02-24 (×3): qty 2

## 2019-02-24 MED ORDER — DEXTROSE 5 % IV SOLN
500.0000 mg | INTRAVENOUS | Status: DC
  Administered 2019-02-25: 500 mg via INTRAVENOUS
  Filled 2019-02-24 (×3): qty 0.5

## 2019-02-24 MED ORDER — SODIUM BICARBONATE 650 MG PO TABS
650.0000 mg | ORAL_TABLET | Freq: Two times a day (BID) | ORAL | Status: DC
  Filled 2019-02-24: qty 1

## 2019-02-24 MED ORDER — POTASSIUM CHLORIDE 10 MEQ/100ML IV SOLN
10.0000 meq | INTRAVENOUS | Status: AC
  Administered 2019-02-24: 10 meq via INTRAVENOUS
  Filled 2019-02-24: qty 100

## 2019-02-24 MED ORDER — TRAMADOL HCL 50 MG PO TABS
50.0000 mg | ORAL_TABLET | Freq: Four times a day (QID) | ORAL | Status: DC | PRN
  Administered 2019-02-24: 50 mg via ORAL
  Filled 2019-02-24: qty 1

## 2019-02-24 MED ORDER — LEVOTHYROXINE SODIUM 75 MCG PO TABS
37.5000 ug | ORAL_TABLET | Freq: Every day | ORAL | Status: DC
  Filled 2019-02-24: qty 0.5

## 2019-02-24 MED ORDER — LACTULOSE 10 GM/15ML PO SOLN
20.0000 g | Freq: Three times a day (TID) | ORAL | Status: DC
  Administered 2019-02-24: 20 g via ORAL
  Filled 2019-02-24 (×3): qty 30

## 2019-02-24 MED ORDER — ONDANSETRON HCL 4 MG PO TABS: 4.0000 mg | ORAL_TABLET | Freq: Four times a day (QID) | ORAL | Status: DC | PRN

## 2019-02-24 MED ORDER — VANCOMYCIN HCL 500 MG IV SOLR: 500.0000 mg | INTRAVENOUS | Status: DC

## 2019-02-24 MED ORDER — SODIUM CHLORIDE 0.9 % IV BOLUS
2000.0000 mL | Freq: Once | INTRAVENOUS | Status: AC
  Administered 2019-02-24: 2000 mL via INTRAVENOUS

## 2019-02-24 MED ORDER — POTASSIUM CHLORIDE 10 MEQ/100ML IV SOLN
10.0000 meq | INTRAVENOUS | Status: AC
  Administered 2019-02-24 (×3): 10 meq via INTRAVENOUS
  Filled 2019-02-24 (×3): qty 100

## 2019-02-24 MED ORDER — HEPARIN SODIUM (PORCINE) 5000 UNIT/ML IJ SOLN
5000.0000 [IU] | Freq: Three times a day (TID) | INTRAMUSCULAR | Status: DC
  Administered 2019-02-25: 5000 [IU] via SUBCUTANEOUS
  Filled 2019-02-24: qty 1

## 2019-02-24 MED ORDER — ACETAMINOPHEN 650 MG RE SUPP: 650.0000 mg | Freq: Four times a day (QID) | RECTAL | Status: DC | PRN

## 2019-02-24 NOTE — ED Notes (Signed)
Patient transported to Ultrasound 

## 2019-02-24 NOTE — H&P (Signed)
History and Physical    [name] YQI:[id number] DOB: [birth year] DOA: 02/22/2019  PCP: Glendale Chard, MD   Patient coming from: Home  Chief Complaint: Confusion  HPI: [name] is a 73 y.o. female with medical history significant of hypertension, questionable liver disease, chronic kidney disease stage IV, hypothyroidism, anemia chronic kidney disease, as well as other comorbidities who presents with a chief complaint of confusion.  Patient is extremely hard of hearing and is unable to provide a subjective history at this time given her altered mental status and history was taken from the patient's daughter along with EDP.  Patient's daughter states that the patient lives close by and patient daughter went to check on her she was severely confused and patient daughter states this is worse than her been.  Per patient's daughter she has been confused since this morning and EMS was called and they found patient to have a CBG of 44 upon arrival and she was given 25 g of D10 in route.  Patient is extremely hard of hearing and answers some questions but remains encephalopathic and her only complaint was some right hip and thigh pain.  States that she had not been complaining of anything to 90 diarrhea, nausea or vomiting.  Patient had any fevers, headaches, lightheadedness or dizziness and states that this is happened before in the past when her kidney function got too high.  No other concerns or complaints at this time and she was brought to the emergency room for further evaluation.  She was called admit this patient for acute encephalopathy and have called nephrology for further evaluation and assistance given her worsening kidney function  ED Course: Patient had an extensive work-up in the ED done including a CBC, CMP, head CT, blood cultures, urinalysis and urine culture, TSH and free T4 were ordered, along with a chest x-ray, hip x-ray, a CT maxillofacial as well as a right upper quadrant  ultrasound.  She is also given 1 L of normal saline bolus in the ED  Review of Systems: As per HPI otherwise 10 point review of systems negative.   Past Medical History:  Diagnosis Date  . Hypertension   . Liver disease   . Renal disorder   . Thyroid disease    Past Surgical History:  Procedure Laterality Date  . COCHLEAR IMPLANT    . KNEE SURGERY     SOCIAL HISTORY  reports that she has quit smoking. She has never used smokeless tobacco. She reports that she does not drink alcohol or use drugs.  ALLERGIES No Known Allergies  Family History  Problem Relation Age of Onset  . Hypertension Other   . Cervical cancer Mother   . Aneurysm Father    Prior to Admission medications   Medication Sig Start Date End Date Taking? Authorizing Provider  amLODipine (NORVASC) 5 MG tablet TAKE 1 TABLET(5 MG) BY MOUTH DAILY Patient taking differently: Take 5 mg by mouth daily.  11/19/18  Yes Minette Brine, FNP  hydrochlorothiazide (HYDRODIURIL) 12.5 MG tablet Take 12.5 mg by mouth daily.   Yes [provider]  levothyroxine (SYNTHROID, LEVOTHROID) 75 MCG tablet TAKE 1/2 TABLET DAILY Patient taking differently: Take 37.5 mcg by mouth daily.  01/26/19  Yes Minette Brine, FNP  sodium bicarbonate 650 MG tablet Take 1 tablet (650 mg total) by mouth 3 (three) times daily. Patient taking differently: Take 650 mg by mouth 2 (two) times daily.  04/03/13  Yes Dhungel, Flonnie Overman, MD   Physical Exam: Vitals:  03/04/2019 1456 03/16/2019 1600 03/12/2019 1700 03/17/2019 1705  BP:  (!) 116/94 130/69   Pulse:  92    Resp:  14 (!) 21   Temp:    (!) 96.7 F (35.9 C)  TempSrc:    Rectal  SpO2:  92%    Weight: 48.7 kg     Height: 5\' 1"  (1.549 m)      Constitutional: Thin elderly AAFand appears agitated and uncomfortable Eyes: Lids and conjunctivae normal, sclerae anicteric  ENMT: External Ears, Nose appear normal. Extremely hard of hearing. And MM appear dry; Has some swelling and erythema on the Right  Jaw Neck: Appears normal, supple, no cervical masses, normal ROM, no appreciable thyromegaly; no visible JVD Respiratory: Diminished to auscultation bilaterally, no wheezing, rales, rhonchi or crackles. Normal respiratory effort and patient is not tachypenic. No accessory muscle use.  Cardiovascular: RRR, no murmurs / rubs / gallops. S1 and S2 auscultated. 1-2+ LE extremity edema.  Abdomen: Soft, non-tender, non-distended. No masses palpated. No appreciable hepatosplenomegaly. Bowel sounds positive x4.  GU: Deferred. Musculoskeletal: No clubbing / cyanosis of digits/nails. No joint deformity upper and lower extremities.  Skin: No rashes, lesions, ulcers on a limited skin eval. No induration; Warm and dry.  Neurologic: Extremely hard of hearing and moves extremities Psychiatric: Impaired judgment and insight. Awake and not oriented x 3. Anxious mood and appropriate affect.   Labs on Admission: I have personally reviewed following labs and imaging studies  CBC: Recent Labs  Lab 02/23/2019 1512  WBC 14.4*  NEUTROABS 11.4*  HGB 8.4*  HCT 25.8*  MCV 129.0*  PLT PLATELET CLUMPS NOTED ON SMEAR, COUNT APPEARS DECREASED   Basic Metabolic Panel: Recent Labs  Lab 03/12/2019 1512  NA 136  K 2.6*  CL 103  CO2 15*  GLUCOSE 120*  BUN 50*  CREATININE 3.37*  CALCIUM 8.3*   GFR: Estimated Creatinine Clearance: 11.1 mL/min (A) (by C-G formula based on SCr of 3.37 mg/dL (H)). Liver Function Tests: Recent Labs  Lab 03/17/2019 1512  AST 64*  ALT 35  ALKPHOS 114  BILITOT 3.0*  PROT 5.5*  ALBUMIN 2.9*   No results for input(s): LIPASE, AMYLASE in the last 168 hours. Recent Labs  Lab 03/12/2019 1512  AMMONIA 81*   Coagulation Profile: No results for input(s): INR, PROTIME in the last 168 hours. Cardiac Enzymes: Recent Labs  Lab 02/27/2019 1512  TROPONINI 0.04*   BNP (last 3 results) Recent Labs    11/17/18 1652 12/25/18 1304  PROBNP 888* 598*   HbA1C: No results for input(s):  HGBA1C in the last 72 hours. CBG: Recent Labs  Lab 03/06/2019 1457 03/05/2019 1553  GLUCAP 157* 82   Lipid Profile: No results for input(s): CHOL, HDL, LDLCALC, TRIG, CHOLHDL, LDLDIRECT in the last 72 hours. Thyroid Function Tests: No results for input(s): TSH, T4TOTAL, FREET4, T3FREE, THYROIDAB in the last 72 hours. Anemia Panel: No results for input(s): VITAMINB12, FOLATE, FERRITIN, TIBC, IRON, RETICCTPCT in the last 72 hours. Urine analysis:    Component Value Date/Time   COLORURINE YELLOW 03/16/2019 1700   APPEARANCEUR HAZY (A) 03/09/2019 1700   LABSPEC 1.018 03/02/2019 1700   PHURINE 5.0 02/27/2019 1700   GLUCOSEU NEGATIVE 03/08/2019 1700   HGBUR SMALL (A) 02/27/2019 1700   BILIRUBINUR NEGATIVE 03/19/2019 1700   KETONESUR NEGATIVE 03/06/2019 1700   PROTEINUR NEGATIVE 02/28/2019 1700   UROBILINOGEN 0.2 03/30/2013 0519   NITRITE NEGATIVE 03/22/2019 1700   LEUKOCYTESUR NEGATIVE 03/21/2019 1700   Sepsis Labs: !!!!!!!!!!!!!!!!!!!!!!!!!!!!!!!!!!!!!!!!!!!! @LABRCNTIP (procalcitonin:4,lacticidven:4) )No  results found for this or any previous visit (from the past 240 hour(s)).   Radiological Exams on Admission: Dg Chest 2 View  Result Date: 03/11/2019 CLINICAL DATA:  Altered mental status EXAM: CHEST - 2 VIEW COMPARISON:  06/21/2018 FINDINGS: Decreased lung volume with mild bibasilar atelectasis. Negative for heart failure or effusion. Negative for pneumonia. IMPRESSION: Hypoventilation with mild bibasilar atelectasis. Electronically Signed   By: Franchot Gallo M.D.   On: 02/26/2019 17:03   Dg Pelvis 1-2 Views  Result Date: 03/22/2019 CLINICAL DATA:  Pain EXAM: PELVIS - 1-2 VIEW COMPARISON:  CT abdomen pelvis 01/22/2018 FINDINGS: Both hips are normal. No pelvic fracture or mass. Moderate to advanced degenerative change in the SI joints bilaterally. Lumbar scoliosis and degenerative change. IMPRESSION: Negative for fracture. Electronically Signed   By: Franchot Gallo M.D.   On:  03/21/2019 17:06   Ct Head Wo Contrast  Result Date: 03/12/2019 CLINICAL DATA:  AMS, no trauma EXAM: CT HEAD WITHOUT CONTRAST TECHNIQUE: Contiguous axial images were obtained from the base of the skull through the vertex without intravenous contrast. COMPARISON:  None. FINDINGS: Brain: Left-sided cochlear implant and related dense metallic streak artifact somewhat limits evaluation of the left hemispheric brain parenchyma. No evidence of acute infarction, hemorrhage, hydrocephalus, extra-axial collection or mass lesion/mass effect. Vascular: No hyperdense vessel or unexpected calcification. Skull: Normal. Negative for fracture or focal lesion. Sinuses/Orbits: No acute finding. Other: None. IMPRESSION: Left-sided cochlear implant and related dense metallic streak artifact somewhat limits evaluation of the left hemispheric brain parenchyma. Within this limitation, no acute intracranial pathology. Electronically Signed   By: Eddie Candle M.D.   On: 03/20/2019 15:54   Dg Femur Min 2 Views Right  Result Date: 03/06/2019 CLINICAL DATA:  Pain EXAM: RIGHT FEMUR 2 VIEWS COMPARISON:  None. FINDINGS: Normal right hip joint. No femur fracture. Right knee replacement satisfactory position alignment without loosening. IMPRESSION: No acute abnormality.  Right knee replacement. Electronically Signed   By: Franchot Gallo M.D.   On: 03/01/2019 17:05   EKG: Independently reviewed. Showed a Sinus Rhythm at 96 and Mild Twave inversions. qTC was  482.   Assessment/Plan Active Problems:   Encephalopathy acute  Acute Encephalopathy -Place in Observation Telemetry -Unclear Etiology but could be multifactorial given worsening Renal Fxn ([place]) confounded by Hyperammonemia (Hepatic) vs ? Infectious Source or other Metabolic Cause; ? Reportedly Hypoglycemic with a blood sugar of 44 via EMS -Head CT done and showed "Left-sided cochlear implant and related dense metallic streak artifact somewhat limits evaluation of the left  hemispheric brain parenchyma. Within this limitation, no acute intracranial pathology." -Check TSH and Free T4, Had a TSH of 128 a few months ago that improved to 2.8 a month ago -Check Urinalysis, Urine Cx, and UDS; Urinalysis showd Hazy appearance, Small Hgb, Negative Nitrites, Negative Leukocytes, and 0-5 WBC's; There was no Protein on Urinalysis  -Given Lactulose 20 g TID for Hyperammonemia -Check Blood Cx's x2 -CXR showed Decreased lung volume with mild bibasilar atelectasis. Negative for heart failure or effusion. Negative for pneumonia. -Patient only complaining of Hip /Thigh Pain -Discussed Case with Nephrology Dr. Hollie Salk who will come evaluate the patient -Consider getting an MRI and Neurology consult if Renal Fxn improves and Ammonia trends down and still confused -Delirium Precautions -NPO Until SLP evaluation   Hyperammonemia -Patient's Ammonia Level as 81 on admission -? History of Cirrhosis -RUQ U/S being ordered by EDP -Given Lactulose 20 g TID  Hypokalemia -In the setting of HCTZ usage -Hold HCTZ -Replete  with 40 mEQ of Potassium Chloride -Check Magnesium Level -Continue to Monitor and Replete as Necessary  Anion Gap Metabolic Acidosis -Patient's CO2 was 15 and AG was 18 -Check Lactic Acid Level -Resume Home po Bicarbonate when able to tolerate po -Started Sodium Bicarbonate gtt with 150 mEQ in D5W at 75 mL/hr x12 hours -Repeat CMP in AM  ? Hypoglycemia -C/w Sodium Bicarbonate in D5W -Continue to Monitor Blood Sugars carefully  AKI on CKD Stage 4 -Baseline CR was around 2 to 2.7 -Presents with a BUN/Cr of 50/3.37 -Given 1 liter of IVF in ED -Started on Maintenance IVF with Sodium Bicarbonate gtt in D5W -Avoid Nephrotoxic Medications and Hold Home HCTZ -Check Urine Lytes; ? Dehydration -Have asked Urology to evaluate and Treat  Leukocytosis  -Patient is Afebrile and ? Reactive in the setting of Dehydration -R/o Infection and Obtain Blood Cx x2 -Urinalysis  unremarkable and Urine Cx pending -WBC was 14.4 -Continue to Monitor for S/Sx of Infection and hold off Abx for now -Maxillofacial CT Scan being obtained  -Follow up on Cx -Repeat CBC in AM  Hypothyroidism -Check TSH and Free T4 -Last TSH was 2.8 -C/w Levothyroxine 37.5 mcg po Daily  Mildly Elevated AST -Check RUQ U/S and Acute Hepatitis Panel -AST was 64 and ALT was 35 -Continue to Monitor and Trend Hepatic Fxn -Repeat CMP in AM  Macrocytic Anemia/Anemia of Chronic Kidney Disease -Hb/Hct on admission was 8.4/25.8 -Check Anemia Panel in the AM -Continue to Monitor for S/Sx of Bleeding -Repeat CBC in AM   Swelling  Erythema of the Right Jaw -Obtaining Maxillofacial CT and pending and make a decision about Abx pending further workup -Concern for Parotitis; Afebrile  HTN -BP currently at goal -Hold HCTZ and Hold Amlodipine for now  Protein Calorie Malnutrition -Will get Nutritionist on Board  DVT prophylaxis: Heparin 5,000 units sq q8h Code Status: FULL CODE Family Communication: Discussed with Daughter at bedside Disposition Plan: Pending Further Workup and Evaluation Consults called: Nephrology Dr. Hollie Salk Admission status: Obs Telemetry  Severity of Illness: The appropriate patient status for this patient is OBSERVATION. Observation status is judged to be reasonable and necessary in order to provide the required intensity of service to ensure the patient's safety. The patient's presenting symptoms, physical exam findings, and initial radiographic and laboratory data in the context of their medical condition is felt to place them at decreased risk for further clinical deterioration. Furthermore, it is anticipated that the patient will be medically stable for discharge from the hospital within 2 midnights of admission. The following factors support the patient status of observation.   " The patient's presenting symptoms include Confusion/Encephalopathy. " The physical exam  findings include Hard of hearing; 1-2+ LE Swelling . " The initial radiographic and laboratory data are as above and concerning   Kerney Elbe, D.O. [facility] PAGER is on [place]  If 7PM-7AM, please contact night-coverage [email or web address] Password [facility]  03/08/2019, 5:28 PM

## 2019-02-24 NOTE — ED Triage Notes (Signed)
Pt arrives to ED from home with complaints of AMS since this morning as reported by family. EMS reports pt's CBG was 44 upon arrival, 25g of D10 en route. Pt also complains of right upper leg pain. Pt placed in position of comfort with bed locked and lowered, call bell in reach.

## 2019-02-24 NOTE — Progress Notes (Signed)
Pharmacy Antibiotic Note  [name] is a 73 y.o. female admitted on 03/12/2019 with sepsis.  Pharmacy has been consulted for vancomycin/cefepime dosing. Afebrile, WBC 14.4, LA 7.1. SCr 3.37 on admit (baseline ~2.5-3), CrCl~11.  Plan: Cefepime $RemoveBefore'500mg'[id number]$  IV q24h Vancomycin 1g IV x1; then Vancomycin 500 mg IV Q 48 hrs. Goal AUC 400-550. Expected AUC: 525 SCr used: 3.37 Monitor clinical progress, c/s, renal function F/u de-escalation plan/LOT, vancomycin trough as indicated   Height: $Remove'5\' 1"'OGyEEGO$  (154.9 cm) Weight: 107 lb 5.8 oz (48.7 kg) IBW/kg (Calculated) : 47.8  Temp (24hrs), Avg:97.6 F (36.4 C), Min:96.7 F (35.9 C), Max:98.5 F (36.9 C)  Recent Labs  Lab 02/22/2019 1512 03/12/2019 1824  WBC 14.4*  --   CREATININE 3.37*  --   LATICACIDVEN  --  7.1*    Estimated Creatinine Clearance: 11.1 mL/min (A) (by C-G formula based on SCr of 3.37 mg/dL (H)).    No Known Allergies  Antimicrobials this admission: 3/3 vancomycin >>  3/3 cefepime >>   Dose adjustments this admission:   Microbiology results:   Elicia Lamp, PharmD, BCPS Clinical Pharmacist 03/09/2019 8:40 PM

## 2019-02-24 NOTE — ED Provider Notes (Signed)
[facility] EMERGENCY DEPARTMENT Provider Note   CSN: [id number] Arrival date & time: 03/13/2019  1437    History   Chief Complaint Chief Complaint  Patient presents with  . Altered Mental Status    HPI [name] is a 73 y.o. female.     The history is provided by the patient.  Altered Mental Status  Presenting symptoms: confusion   Severity:  Mild Most recent episode:  Today Episode history:  Single Timing:  Rare Progression:  Improving Chronicity:  New Context: not dementia, not drug use and not recent illness   Associated symptoms: no abdominal pain, no fever, no headaches, no light-headedness, no nausea, no palpitations, no rash, no seizures and no vomiting     Past Medical History:  Diagnosis Date  . Hypertension   . Liver disease   . Renal disorder   . Thyroid disease     Patient Active Problem List   Diagnosis Date Noted  . Encephalopathy acute 03/15/2019  . Hyperammonemia ([place]) 03/10/2019  . Hypokalemia 03/19/2019  . Increased anion gap metabolic acidosis 78/29/5621  . Acute kidney injury superimposed on chronic kidney disease ([place]) 03/12/2019  . Leukocytosis 02/28/2019  . Elevated AST (SGOT) 03/06/2019  . Macrocytic anemia 03/13/2019  . Community acquired pneumonia of left lower lobe of lung ([place]) 06/21/2018  . Diarrhea 06/21/2018  . Essential hypertension 02/27/2018  . Narrow complex tachycardia ([place]) 02/26/2018  . Protein-calorie malnutrition, severe 01/25/2018  . UTI (urinary tract infection) 01/22/2018  . AKI (acute kidney injury) ([place]) 01/22/2018  . Left renal mass 04/03/2013  . Right upper lobe consolidation ([place]) 03/30/2013  . Cough with hemoptysis 03/30/2013  . Tobacco abuse 03/30/2013  . Weight loss 03/30/2013  . Thiazide diuretics causing adverse effect in therapeutic use 03/30/2013  . CKD (chronic kidney disease) stage 4, GFR 15-29 ml/min (HCC)   . Liver disease/? cirrhosis   . Hypothyroidism     Past Surgical  History:  Procedure Laterality Date  . COCHLEAR IMPLANT    . KNEE SURGERY       OB History   No obstetric history on file.      Home Medications    Prior to Admission medications   Medication Sig Start Date End Date Taking? Authorizing Provider  amLODipine (NORVASC) 5 MG tablet TAKE 1 TABLET(5 MG) BY MOUTH DAILY Patient taking differently: Take 5 mg by mouth daily.  11/19/18  Yes Minette Brine, FNP  hydrochlorothiazide (HYDRODIURIL) 12.5 MG tablet Take 12.5 mg by mouth daily.   Yes [provider]  levothyroxine (SYNTHROID, LEVOTHROID) 75 MCG tablet TAKE 1/2 TABLET DAILY Patient taking differently: Take 37.5 mcg by mouth daily.  01/26/19  Yes Minette Brine, FNP  sodium bicarbonate 650 MG tablet Take 1 tablet (650 mg total) by mouth 3 (three) times daily. Patient taking differently: Take 650 mg by mouth 2 (two) times daily.  04/03/13  Yes Dhungel, Flonnie Overman, MD    Family History Family History  Problem Relation Age of Onset  . Hypertension Other   . Cervical cancer Mother   . Aneurysm Father     Social History Social History   Tobacco Use  . Smoking status: Former Research scientist (life sciences)  . Smokeless tobacco: Never Used  Substance Use Topics  . Alcohol use: No  . Drug use: No     Allergies   Patient has no known allergies.   Review of Systems Review of Systems  Constitutional: Positive for appetite change. Negative for chills and fever.  HENT:  Negative for ear pain and sore throat.   Eyes: Negative for pain and visual disturbance.  Respiratory: Positive for shortness of breath. Negative for cough.   Cardiovascular: Positive for leg swelling. Negative for chest pain and palpitations.  Gastrointestinal: Negative for abdominal pain, nausea and vomiting.  Genitourinary: Positive for dysuria. Negative for hematuria.  Musculoskeletal: Positive for arthralgias. Negative for back pain.  Skin: Negative for color change and rash.  Neurological: Negative for seizures, syncope,  light-headedness and headaches.  Psychiatric/Behavioral: Positive for confusion.  All other systems reviewed and are negative.    Physical Exam Updated Vital Signs  ED Triage Vitals  Enc Vitals Group     BP 03/02/2019 1454 (!) 110/94     Pulse Rate 02/22/2019 1454 99     Resp 03/05/2019 1454 19     Temp 03/13/2019 1454 98.5 F (36.9 C)     Temp Source 03/15/2019 1454 Oral     SpO2 03/22/2019 1454 92 %     Weight 03/06/2019 1456 107 lb 5.8 oz (48.7 kg)     Height 03/18/2019 1456 $RemoveBefor'5\' 1"'[id number]$  (1.549 m)     Head Circumference --      Peak Flow --      Pain Score 03/05/2019 1455 8     Pain Loc --      Pain Edu? --      Excl. in [place]? --     Physical Exam   ED Treatments / Results  Labs (all labs ordered are listed, but only abnormal results are displayed) Labs Reviewed  COMPREHENSIVE METABOLIC PANEL - Abnormal; Notable for the following components:      Result Value   Potassium 2.6 (*)    CO2 15 (*)    Glucose, Bld 120 (*)    BUN 50 (*)    Creatinine, Ser 3.37 (*)    Calcium 8.3 (*)    Total Protein 5.5 (*)    Albumin 2.9 (*)    AST 64 (*)    Total Bilirubin 3.0 (*)    GFR calc non Af Amer 13 (*)    GFR calc Af Amer 15 (*)    Anion gap 18 (*)    All other components within normal limits  CBC WITH DIFFERENTIAL/PLATELET - Abnormal; Notable for the following components:   WBC 14.4 (*)    RBC 2.00 (*)    Hemoglobin 8.4 (*)    HCT 25.8 (*)    MCV 129.0 (*)    MCH 42.0 (*)    RDW 25.2 (*)    nRBC 1.2 (*)    Neutro Abs 11.4 (*)    Lymphs Abs 0.6 (*)    Monocytes Absolute 1.9 (*)    Abs Immature Granulocytes 0.14 (*)    All other components within normal limits  URINALYSIS, COMPLETE (UACMP) WITH MICROSCOPIC - Abnormal; Notable for the following components:   APPearance HAZY (*)    Hgb urine dipstick SMALL (*)    Bacteria, UA MANY (*)    All other components within normal limits  AMMONIA - Abnormal; Notable for the following components:   Ammonia 81 (*)    All other components within  normal limits  TROPONIN I - Abnormal; Notable for the following components:   Troponin I 0.04 (*)    All other components within normal limits  PHOSPHORUS - Abnormal; Notable for the following components:   Phosphorus 5.1 (*)    All other components within normal limits  CBG MONITORING, ED - Abnormal; Notable  for the following components:   Glucose-Capillary 157 (*)    All other components within normal limits  URINE CULTURE  CULTURE, BLOOD (ROUTINE X 2)  CULTURE, BLOOD (ROUTINE X 2)  MAGNESIUM  LACTIC ACID, PLASMA  TSH  T4, FREE  NA AND K (SODIUM & POTASSIUM), RAND UR  HEMOGLOBIN A1C  COMPREHENSIVE METABOLIC PANEL  CBC  RAPID URINE DRUG SCREEN, HOSP PERFORMED  CBG MONITORING, ED    EKG None  Radiology Dg Chest 2 View  Result Date: 03/21/2019 CLINICAL DATA:  Altered mental status EXAM: CHEST - 2 VIEW COMPARISON:  06/21/2018 FINDINGS: Decreased lung volume with mild bibasilar atelectasis. Negative for heart failure or effusion. Negative for pneumonia. IMPRESSION: Hypoventilation with mild bibasilar atelectasis. Electronically Signed   By: Franchot Gallo M.D.   On: 03/14/2019 17:03   Dg Pelvis 1-2 Views  Result Date: 02/28/2019 CLINICAL DATA:  Pain EXAM: PELVIS - 1-2 VIEW COMPARISON:  CT abdomen pelvis 01/22/2018 FINDINGS: Both hips are normal. No pelvic fracture or mass. Moderate to advanced degenerative change in the SI joints bilaterally. Lumbar scoliosis and degenerative change. IMPRESSION: Negative for fracture. Electronically Signed   By: Franchot Gallo M.D.   On: 03/16/2019 17:06   Ct Head Wo Contrast  Result Date: 03/20/2019 CLINICAL DATA:  AMS, no trauma EXAM: CT HEAD WITHOUT CONTRAST TECHNIQUE: Contiguous axial images were obtained from the base of the skull through the vertex without intravenous contrast. COMPARISON:  None. FINDINGS: Brain: Left-sided cochlear implant and related dense metallic streak artifact somewhat limits evaluation of the left hemispheric brain  parenchyma. No evidence of acute infarction, hemorrhage, hydrocephalus, extra-axial collection or mass lesion/mass effect. Vascular: No hyperdense vessel or unexpected calcification. Skull: Normal. Negative for fracture or focal lesion. Sinuses/Orbits: No acute finding. Other: None. IMPRESSION: Left-sided cochlear implant and related dense metallic streak artifact somewhat limits evaluation of the left hemispheric brain parenchyma. Within this limitation, no acute intracranial pathology. Electronically Signed   By: Eddie Candle M.D.   On: 03/06/2019 15:54   Dg Femur Min 2 Views Right  Result Date: 03/04/2019 CLINICAL DATA:  Pain EXAM: RIGHT FEMUR 2 VIEWS COMPARISON:  None. FINDINGS: Normal right hip joint. No femur fracture. Right knee replacement satisfactory position alignment without loosening. IMPRESSION: No acute abnormality.  Right knee replacement. Electronically Signed   By: Franchot Gallo M.D.   On: 03/21/2019 17:05   Ct Maxillofacial Wo Contrast  Result Date: 03/06/2019 CLINICAL DATA:  Right facial swelling. EXAM: CT MAXILLOFACIAL WITHOUT CONTRAST TECHNIQUE: Multidetector CT imaging of the maxillofacial structures was performed. Multiplanar CT image reconstructions were also generated. COMPARISON:  Head CT obtained earlier today. FINDINGS: Osseous: Previously noted left cochlear implant. Reversal of the normal cervical lordosis and multilevel degenerative changes. No acute maxillofacial bony abnormality. The patient is edentulous. Orbits: Unremarkable. Sinuses: Normally aerated. Soft tissues: The right parotid gland is significantly larger than the left parotid gland. On the right, the parotid gland measures 4.2 x 3.1 cm and on the left the parotid gland measures 2.5 x 2.4 cm on image number 49 series 8. There is also adjacent right lateral subcutaneous edema at the level of the parotid gland on the right, extending anteriorly. No parotid stone is visualized. Limited intracranial: Unremarkable.  IMPRESSION: 1. Changes of parotitis on the right with no abscess visible, limited by the lack of intravenous contrast. 2. Cervical spine degenerative changes and reversal of the normal cervical lordosis. Electronically Signed   By: Claudie Revering M.D.   On: 02/23/2019 17:44  Procedures .Critical Care Performed by: Lennice Sites, DO Authorized by: Lennice Sites, DO   Critical care provider statement:    Critical care time (minutes):  45   Critical care time was exclusive of:  Separately billable procedures and treating other patients and teaching time   Critical care was necessary to treat or prevent imminent or life-threatening deterioration of the following conditions:  Renal failure and metabolic crisis   Critical care was time spent personally by me on the following activities:  Blood draw for specimens, development of treatment plan with patient or surrogate, discussions with primary provider, evaluation of patient's response to treatment, examination of patient, obtaining history from patient or surrogate, ordering and performing treatments and interventions, ordering and review of laboratory studies, ordering and review of radiographic studies, pulse oximetry, re-evaluation of patient's condition and review of old charts   I assumed direction of critical care for this patient from another provider in my specialty: no     (including critical care time)  Medications Ordered in ED Medications  potassium chloride 10 mEq in 100 mL IVPB (10 mEq Intravenous New Bag/Given 03/23/2019 1717)  levothyroxine (SYNTHROID, LEVOTHROID) tablet 37.5 mcg (has no administration in time range)  sodium bicarbonate tablet 650 mg (has no administration in time range)  heparin injection 5,000 Units (has no administration in time range)  sodium bicarbonate 150 mEq in dextrose 5 % 1,000 mL infusion (has no administration in time range)  acetaminophen (TYLENOL) tablet 650 mg (has no administration in time range)     Or  acetaminophen (TYLENOL) suppository 650 mg (has no administration in time range)  traMADol (ULTRAM) tablet 50 mg (has no administration in time range)  lactulose (CHRONULAC) 10 GM/15ML solution 20 g (has no administration in time range)  ondansetron (ZOFRAN) tablet 4 mg (has no administration in time range)    Or  ondansetron (ZOFRAN) injection 4 mg (has no administration in time range)  potassium chloride 20 MEQ/15ML (10%) solution 40 mEq (40 mEq Oral Given 02/23/2019 1652)  sodium chloride 0.9 % bolus 1,000 mL (1,000 mLs Intravenous New Bag/Given 03/13/2019 1716)     Initial Impression / Assessment and Plan / ED Course  I have reviewed the triage vital signs and the nursing notes.  Pertinent labs & imaging results that were available during my care of the patient were reviewed by me and considered in my medical decision making (see chart for details).        [name] is a 73 year old female with history of CKD, hypothyroidism, hypertension, possible liver disease who presents to the ED with altered mental status.  Patient with normal vitals.  No fever.  Patient with gradual confusion over the last several days according to family member.  Has no real specific complaints.  Has some chronic swelling over the right side of her face and states that she has some right thigh pain but denies any trauma.  Otherwise patient has no chest pain, shortness of breath, abdominal pain.  Overall history is limited due to patient with some confusion and hard of hearing.  Family member states that she is just not at her neurological baseline and seems more confused than normal.  Exam is overall unremarkable.  No neuro focal deficits.  Broad work-up was initiated including CT of the head and face, x-rays of the right thigh and hip.  Infectious work-up initiated with chest x-ray, urine studies.  Basic labs also ordered.  EKG showed sinus rhythm.  T wave inversions laterally and  inferiorly that have been there in  the past.  Patient denies any chest pain.  Lab work reveals multiple metabolic derangements including hypokalemia at 2.6 which was repleted.  Worsening AKI with a creatinine of 3.3.  Bicarb 15.  BUN 50.  Bilirubin mildly elevated at 3.  Patient mild white count of 14 chronic anemia 8.4.  CT of the head unremarkable.  CT of the face shows mild swelling of the parotid gland but no abscess.  Chronic in nature likely.  Patient with no fractures of the right lower extremity.  Urinalysis overall equivocal.  No leukocytes or nitrites.  Some bacteria, will get culture.  Will hold antibiotics and defer to inpatient team.  Overall believe confusion is likely from uremia, metabolic derangements, possible dehydration.  Low concern for infectious process at this time.  Patient did have elevated ammonia as well at 88.  Mild elevation of troponin however patient without any chest pain.  Overall reassuring EKG.  Will trend troponins.  Likely secondary to demand from electrolyte abnormalities, uremia.  There is no obvious cause of this.  Patient is not particularly tender in this area.  After discussion with the hospitalist we will order right upper quadrant ultrasound to further evaluate.  Urine electrolyte studies sent for.  Patient given normal saline bolus.  Will admit for further observation.  Will rehydrate and recheck labs.  Urine culture was also sent for.  Patient hemodynamically stable throughout my care.  This chart was dictated using voice recognition software.  Despite best efforts to proofread,  errors can occur which can change the documentation meaning.    Final Clinical Impressions(s) / ED Diagnoses   Final diagnoses:  RUQ pain  Hypokalemia  AKI (acute kidney injury) ([place])  Altered mental status, unspecified altered mental status type    ED Discharge Orders    None       Lennice Sites, DO 03/04/2019 1805

## 2019-02-24 NOTE — ED Notes (Signed)
Floor coverage PA notified of pt's high lactic acid and need for bed request to be changed. 3E not able to accept pt with high lactic.

## 2019-02-24 NOTE — ED Notes (Addendum)
ED TO INPATIENT HANDOFF REPORT  ED Nurse Name and Phone #:  Suezanne Jacquet [phone]  S Name/Age/Gender [name] 73 y.o. female Room/Bed: 035C/035C  Code Status   Code Status: Full Code  Home/SNF/Other Home Patient oriented to: X2 Is this baseline? No   Triage Complete: Triage complete  Chief Complaint altered  Triage Note Pt arrives to ED from home with complaints of AMS since this morning as reported by family. EMS reports pt's CBG was 44 upon arrival, 25g of D10 en route. Pt also complains of right upper leg pain. Pt placed in position of comfort with bed locked and lowered, call bell in reach.    Allergies No Known Allergies  Level of Care/Admitting Diagnosis ED Disposition    ED Disposition Condition Comment   Admit  Hospital Area: [facility] [[id number]]  Level of Care: Medical Telemetry [104]  I expect the patient will be discharged within 24 hours: No (not a candidate for 5C-Observation unit)  Diagnosis: Encephalopathy acute [893810]  Admitting Physician: Barton, Barton [[id number]]  Attending Physician: Raiford Noble LATIF [[id number]]  PT Class (Do Not Modify): Observation [104]  PT Acc Code (Do Not Modify): Observation [10022]       B Medical/Surgery History Past Medical History:  Diagnosis Date  . Hypertension   . Liver disease   . Renal disorder   . Thyroid disease    Past Surgical History:  Procedure Laterality Date  . COCHLEAR IMPLANT    . KNEE SURGERY       A IV Location/Drains/Wounds Patient Lines/Drains/Airways Status   Active Line/Drains/Airways    Name:   Placement date:   Placement time:   Site:   Days:   Peripheral IV 02/22/2019 Right Antecubital   03/05/2019    1826    Antecubital   less than 1          Intake/Output Last 24 hours  Intake/Output Summary (Last 24 hours) at 03/24/2019 1954 Last data filed at 03/02/2019 1817 Gross per 24 hour  Intake 0 ml  Output -  Net 0 ml    Labs/Imaging Results for orders placed or  performed during the hospital encounter of 02/22/2019 (from the past 48 hour(s))  CBG monitoring, ED     Status: Abnormal   Collection Time: 03/05/2019  2:57 PM  Result Value Ref Range   Glucose-Capillary 157 (H) 70 - 99 mg/dL   Comment 1 Notify RN    Comment 2 Document in Chart   Comprehensive metabolic panel     Status: Abnormal   Collection Time: 03/21/2019  3:12 PM  Result Value Ref Range   Sodium 136 135 - 145 mmol/L   Potassium 2.6 (LL) 3.5 - 5.1 mmol/L    Comment: CRITICAL RESULT CALLED TO, READ BACK BY AND VERIFIED WITH: Marlyn Corporal AT 1608 03/12/2019 BY L BENFIELD    Chloride 103 98 - 111 mmol/L   CO2 15 (L) 22 - 32 mmol/L   Glucose, Bld 120 (H) 70 - 99 mg/dL   BUN 50 (H) 8 - 23 mg/dL   Creatinine, Ser 3.37 (H) 0.44 - 1.00 mg/dL   Calcium 8.3 (L) 8.9 - 10.3 mg/dL   Total Protein 5.5 (L) 6.5 - 8.1 g/dL   Albumin 2.9 (L) 3.5 - 5.0 g/dL   AST 64 (H) 15 - 41 U/L   ALT 35 0 - 44 U/L   Alkaline Phosphatase 114 38 - 126 U/L   Total Bilirubin 3.0 (H) 0.3 - 1.2 mg/dL  GFR calc non Af Amer 13 (L) >60 mL/min   GFR calc Af Amer 15 (L) >60 mL/min   Anion gap 18 (H) 5 - 15    Comment: Performed at [facility] [address], [place], [place] [address]  CBC WITH DIFFERENTIAL     Status: Abnormal   Collection Time: 03/01/2019  3:12 PM  Result Value Ref Range   WBC 14.4 (H) 4.0 - 10.5 K/uL   RBC 2.00 (L) 3.87 - 5.11 MIL/uL   Hemoglobin 8.4 (L) 12.0 - 15.0 g/dL   HCT 25.8 (L) 36.0 - 46.0 %   MCV 129.0 (H) 80.0 - 100.0 fL   MCH 42.0 (H) 26.0 - 34.0 pg   MCHC 32.6 30.0 - 36.0 g/dL   RDW 25.2 (H) 11.5 - 15.5 %   Platelets  150 - 400 K/uL    PLATELET CLUMPS NOTED ON SMEAR, COUNT APPEARS DECREASED    Comment: Immature Platelet Fraction may be clinically indicated, consider ordering this additional test [id number]    nRBC 1.2 (H) 0.0 - 0.2 %   Neutrophils Relative % 79 %   Neutro Abs 11.4 (H) 1.7 - 7.7 K/uL   Lymphocytes Relative 4 %   Lymphs Abs 0.6 (L) 0.7 - 4.0 K/uL   Monocytes  Relative 13 %   Monocytes Absolute 1.9 (H) 0.1 - 1.0 K/uL   Eosinophils Relative 3 %   Eosinophils Absolute 0.4 0.0 - 0.5 K/uL   Basophils Relative 0 %   Basophils Absolute 0.1 0.0 - 0.1 K/uL   Immature Granulocytes 1 %   Abs Immature Granulocytes 0.14 (H) 0.00 - 0.07 K/uL   Tear Drop Cells PRESENT    Polychromasia PRESENT    Ovalocytes PRESENT     Comment: Performed at [facility], [place] [address] [place], [place] [address]  Ammonia     Status: Abnormal   Collection Time: 03/04/2019  3:12 PM  Result Value Ref Range   Ammonia 81 (H) 9 - 35 umol/L    Comment: Performed at [facility] [address], [place], [place] [address]  Troponin I - ONCE - STAT     Status: Abnormal   Collection Time: 03/04/2019  3:12 PM  Result Value Ref Range   Troponin I 0.04 (HH) <0.03 ng/mL    Comment: CRITICAL RESULT CALLED TO, READ BACK BY AND VERIFIED WITH: Marlyn Corporal AT 1608 03/10/2019 BY L BENFIELD Performed at [facility], [place] [address] [place], [place] [address]   CBG monitoring, ED     Status: None   Collection Time: 03/01/2019  3:53 PM  Result Value Ref Range   Glucose-Capillary 82 70 - 99 mg/dL  Urinalysis, Complete w Microscopic     Status: Abnormal   Collection Time: 02/25/2019  5:00 PM  Result Value Ref Range   Color, Urine YELLOW YELLOW   APPearance HAZY (A) CLEAR   Specific Gravity, Urine 1.018 1.005 - 1.030   pH 5.0 5.0 - 8.0   Glucose, UA NEGATIVE NEGATIVE mg/dL   Hgb urine dipstick SMALL (A) NEGATIVE   Bilirubin Urine NEGATIVE NEGATIVE   Ketones, ur NEGATIVE NEGATIVE mg/dL   Protein, ur NEGATIVE NEGATIVE mg/dL   Nitrite NEGATIVE NEGATIVE   Leukocytes,Ua NEGATIVE NEGATIVE   RBC / HPF 6-10 0 - 5 RBC/hpf   WBC, UA 0-5 0 - 5 WBC/hpf   Bacteria, UA MANY (A) NONE SEEN   Squamous Epithelial / LPF 0-5 0 - 5  Mucus PRESENT    Hyaline Casts, UA PRESENT     Comment: Performed at [facility], [place] [address], [place], [place] [address]  Na and K (sodium &  potassium), rand urine     Status: None   Collection Time: 03/06/2019  5:19 PM  Result Value Ref Range   Sodium, Ur <10 mmol/L   Potassium Urine 25 mmol/L    Comment: Performed at [facility] [address], [place], [place] [address]  Magnesium     Status: None   Collection Time: 03/18/2019  5:26 PM  Result Value Ref Range   Magnesium 1.8 1.7 - 2.4 mg/dL    Comment: Performed at [facility], [place] [address] [place], [place] [address]  Phosphorus     Status: Abnormal   Collection Time: 03/10/2019  5:26 PM  Result Value Ref Range   Phosphorus 5.1 (H) 2.5 - 4.6 mg/dL    Comment: Performed at [facility] [address], [place], [place] [address]  Urine rapid drug screen (hosp performed)     Status: None   Collection Time: 03/06/2019  5:31 PM  Result Value Ref Range   Opiates NONE DETECTED NONE DETECTED   Cocaine NONE DETECTED NONE DETECTED   Benzodiazepines NONE DETECTED NONE DETECTED   Amphetamines NONE DETECTED NONE DETECTED   Tetrahydrocannabinol NONE DETECTED NONE DETECTED   Barbiturates NONE DETECTED NONE DETECTED    Comment: (NOTE) DRUG SCREEN FOR MEDICAL PURPOSES ONLY.  IF CONFIRMATION IS NEEDED FOR ANY PURPOSE, NOTIFY LAB WITHIN 5 DAYS. LOWEST DETECTABLE LIMITS FOR URINE DRUG SCREEN Drug Class                     Cutoff (ng/mL) Amphetamine and metabolites    1000 Barbiturate and metabolites    200 Benzodiazepine                 326 Tricyclics and metabolites     300 Opiates and metabolites        300 Cocaine and metabolites        300 THC                            50 Performed at [facility], [place] [address] [place], [place] [address]   Lactic acid, plasma     Status: Abnormal   Collection Time: 03/24/2019  6:24 PM  Result Value Ref Range   Lactic Acid, Venous 7.1 (HH) 0.5 - 1.9 mmol/L    Comment: CRITICAL RESULT CALLED TO, READ BACK BY AND VERIFIED WITH: Angelica Chessman RN @ 1903 ON 03/09/2019 BY Charlestine Night, H Performed at [facility],  [place] [address], [place], [place] [address]   TSH     Status: None   Collection Time: 03/09/2019  6:24 PM  Result Value Ref Range   TSH 4.124 0.350 - 4.500 uIU/mL    Comment: Performed by a 3rd Generation assay with a functional sensitivity of <=0.01 uIU/mL. Performed at [facility], [place] [address] [place], [place] [address]   T4, free     Status: None   Collection Time: 03/12/2019  6:24 PM  Result Value Ref Range   Free T4 1.48 0.82 - 1.77 ng/dL    Comment: (NOTE) Biotin ingestion may interfere with free T4 tests. If the results are inconsistent with the TSH level, previous test results, or the clinical presentation, then  consider biotin interference. If needed, order repeat testing after stopping biotin. Performed at [facility], [place] [address], [place], [place] [address]    Dg Chest 2 View  Result Date: 03/03/2019 CLINICAL DATA:  Altered mental status EXAM: CHEST - 2 VIEW COMPARISON:  06/21/2018 FINDINGS: Decreased lung volume with mild bibasilar atelectasis. Negative for heart failure or effusion. Negative for pneumonia. IMPRESSION: Hypoventilation with mild bibasilar atelectasis. Electronically Signed   By: Franchot Gallo M.D.   On: 02/23/2019 17:03   Dg Pelvis 1-2 Views  Result Date: 03/01/2019 CLINICAL DATA:  Pain EXAM: PELVIS - 1-2 VIEW COMPARISON:  CT abdomen pelvis 01/22/2018 FINDINGS: Both hips are normal. No pelvic fracture or mass. Moderate to advanced degenerative change in the SI joints bilaterally. Lumbar scoliosis and degenerative change. IMPRESSION: Negative for fracture. Electronically Signed   By: Franchot Gallo M.D.   On: 03/11/2019 17:06   Ct Head Wo Contrast  Result Date: 03/17/2019 CLINICAL DATA:  AMS, no trauma EXAM: CT HEAD WITHOUT CONTRAST TECHNIQUE: Contiguous axial images were obtained from the base of the skull through the vertex without intravenous contrast. COMPARISON:  None. FINDINGS: Brain: Left-sided cochlear implant and related dense metallic  streak artifact somewhat limits evaluation of the left hemispheric brain parenchyma. No evidence of acute infarction, hemorrhage, hydrocephalus, extra-axial collection or mass lesion/mass effect. Vascular: No hyperdense vessel or unexpected calcification. Skull: Normal. Negative for fracture or focal lesion. Sinuses/Orbits: No acute finding. Other: None. IMPRESSION: Left-sided cochlear implant and related dense metallic streak artifact somewhat limits evaluation of the left hemispheric brain parenchyma. Within this limitation, no acute intracranial pathology. Electronically Signed   By: Eddie Candle M.D.   On: 03/01/2019 15:54   Dg Femur Min 2 Views Right  Result Date: 03/06/2019 CLINICAL DATA:  Pain EXAM: RIGHT FEMUR 2 VIEWS COMPARISON:  None. FINDINGS: Normal right hip joint. No femur fracture. Right knee replacement satisfactory position alignment without loosening. IMPRESSION: No acute abnormality.  Right knee replacement. Electronically Signed   By: Franchot Gallo M.D.   On: 03/05/2019 17:05   Ct Maxillofacial Wo Contrast  Result Date: 03/15/2019 CLINICAL DATA:  Right facial swelling. EXAM: CT MAXILLOFACIAL WITHOUT CONTRAST TECHNIQUE: Multidetector CT imaging of the maxillofacial structures was performed. Multiplanar CT image reconstructions were also generated. COMPARISON:  Head CT obtained earlier today. FINDINGS: Osseous: Previously noted left cochlear implant. Reversal of the normal cervical lordosis and multilevel degenerative changes. No acute maxillofacial bony abnormality. The patient is edentulous. Orbits: Unremarkable. Sinuses: Normally aerated. Soft tissues: The right parotid gland is significantly larger than the left parotid gland. On the right, the parotid gland measures 4.2 x 3.1 cm and on the left the parotid gland measures 2.5 x 2.4 cm on image number 49 series 8. There is also adjacent right lateral subcutaneous edema at the level of the parotid gland on the right, extending anteriorly.  No parotid stone is visualized. Limited intracranial: Unremarkable. IMPRESSION: 1. Changes of parotitis on the right with no abscess visible, limited by the lack of intravenous contrast. 2. Cervical spine degenerative changes and reversal of the normal cervical lordosis. Electronically Signed   By: Claudie Revering M.D.   On: 02/23/2019 17:44   US Abdomen Limited Ruq  Result Date: 02/25/2019 CLINICAL DATA:  Right upper quadrant pain x1 day EXAM: ULTRASOUND ABDOMEN LIMITED RIGHT UPPER QUADRANT COMPARISON:  01/22/2018 CT FINDINGS: Limited study as the patient deferred repositioning for better images. Gallbladder: Distended gallbladder without mural thickening however there is a significant amount of  biliary sludge and admixed punctate gallstones noted. No secondary signs of acute cholecystitis. Common bile duct: Not visualized. Liver: Coarsened echotexture with morphologic changes of cirrhosis. No discrete mass. Small to moderate volume of ascites is identified outlining the liver. Portal vein is patent on color Doppler imaging with normal direction of blood flow towards the liver. IMPRESSION: 1. Morphologic changes of cirrhosis with ascites. 2. Biliary sludge and tiny calculi are noted within the distended gallbladder without secondary signs of acute cholecystitis. Electronically Signed   By: Ashley Royalty M.D.   On: 02/27/2019 18:08    Pending Labs Unresulted Labs (From admission, onward)    Start     Ordered   02/25/19 0500  Hemoglobin A1c  Tomorrow morning,   R     03/08/2019 1728   02/25/19 0500  Comprehensive metabolic panel  Tomorrow morning,   R     02/22/2019 1728   02/25/19 0500  CBC  Tomorrow morning,   R     03/20/2019 1728   03/09/2019 1859  Iron and TIBC  Add-on,   R     03/03/2019 1858   03/15/2019 1859  Ferritin  Add-on,   R     03/08/2019 1858   03/14/2019 1859  Lactate dehydrogenase  Add-on,   R     03/18/2019 1858   03/24/2019 1859  Save Smear  Once,   R     03/02/2019 1858   03/21/2019 1651  Blood culture  (routine x 2)  BLOOD CULTURE X 2,   STAT     03/21/2019 1651   03/15/2019 1512  Urine culture  ONCE - STAT,   STAT     03/03/2019 1512          Vitals/Pain Today's Vitals   03/23/2019 1600 02/25/2019 1700 03/20/2019 1705 02/27/2019 1930  BP: (!) 116/94 130/69  106/61  Pulse: 92     Resp: 14 (!) 21  20  Temp:   (!) 96.7 F (35.9 C)   TempSrc:   Rectal   SpO2: 92%     Weight:      Height:      PainSc:        Isolation Precautions No active isolations  Medications Medications  potassium chloride 10 mEq in 100 mL IVPB (10 mEq Intravenous New Bag/Given 03/06/2019 1847)  levothyroxine (SYNTHROID, LEVOTHROID) tablet 37.5 mcg (has no administration in time range)  sodium bicarbonate tablet 650 mg (has no administration in time range)  heparin injection 5,000 Units (has no administration in time range)  acetaminophen (TYLENOL) tablet 650 mg (has no administration in time range)    Or  acetaminophen (TYLENOL) suppository 650 mg (has no administration in time range)  traMADol (ULTRAM) tablet 50 mg (has no administration in time range)  lactulose (CHRONULAC) 10 GM/15ML solution 20 g (20 g Oral Given 02/26/2019 1851)  ondansetron (ZOFRAN) tablet 4 mg (has no administration in time range)    Or  ondansetron (ZOFRAN) injection 4 mg (has no administration in time range)  potassium chloride 20 MEQ/15ML (10%) solution 40 mEq (40 mEq Oral Given 03/14/2019 1652)  sodium chloride 0.9 % bolus 1,000 mL (1,000 mLs Intravenous New Bag/Given 02/22/2019 1716)    Mobility non-ambulatory High fall risk   Focused Assessments Liver Fx, Septic Work up   R Recommendations: See Admitting Provider Note  Report given to:   Additional Notes: N/A
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
## 2019-02-24 NOTE — Progress Notes (Signed)
RN, Romeo Apple, paged this NP with elevated lactate of 7.1. NP reviewed chart. RUQ [place] resulted cirrhosis. Pt came in with encephalopathy and was found to have hypothermia without other evidence of sepsis at that time. Her ammonia level was elevated and she has had one dose of Lactulose. NP to bedside to evaluate pt for critical LA.  S: Pts confusion does not allow for a complete ROS. However, she denies abdominal pain but states her right leg hurts. Most of hx taken from daughter at bedside. Daughter states pt has had right leg pain for 3 days and has been rubbing the muscle on that leg. Daughter thinks it looks like a bruise but denies pt falling. Daughter states that pt's liver enzymes have been elevated in the past and they were told by GI that they "didn't know why". She was followed by GI for awhile but then "turned loose". No one has ever mentioned "cirrhosis" before and the pt has never drank ETOH.  O: Frail, acute on chronically ill appearing elderly female in NAD. Awake but has eyes closed. Opens eyes to name calling. Very HOH (hx cochlear implant on left). Answers some questions appropriately, but mainly confused. Says she is in the hospital because she is "sick", but doesn't know the specifics. Does not know year. HEENT: arcus sinilus, very HOH. Cochlear implant device noted on the left. No JVD. Card: RRR. BP 104 with MAP in the 80s. Resp: CTA. Good air exchange. Respiratory rate normal. O2 sat normal. Abd: flat, BS + x 4 quadrants. Soft, NT. No rebound or guarding. Right thigh with redness and warmth to inside of entire thigh. Does not look like a bruise. Rest of BLEs without redness, warmth or swelling.  A/P: 1. Sepsis or unknown origin. LA 7.1. Check procalcitonin stat and qd x3. R/P CBC with diff. Plts clumped on last CBC. Start empiric abx with Vanc and Cefepime. Boluses for LA and r/p q3 hrs until less than 2. Change pt's status to progressive care. Check CT abdomen for intra abd infection and more  clarification on liver. Temp was hypothermic, now 97.9. Watch BP. Bld cx and UA cx pending. CXR neg for PNA.  2. Hyper ammonia with cirrhosis on [place]. Check CT abd. Continue Lactulose and f/up lab. Mental status exam has improved since admission.  3. Right inner thigh warmth and redness. ? Cellulitis. Check CT femur. Xrays were negative. No fall per family.  4. AKI on CDK IV. Nephrology saw pt. They do not feel her encephalopathy is uremic in origin. Continue bicarb po. They stopped Bicarb gtt. Hydrate. Recheck labs. Purewick for now, but may need Foley for accurate intake and output.  5. Hypokalemia-being repleted. Recheck lab. Holding HCTZ.  6. Hypothyroidism-rechecking labs.  7. Anemia-apparently on Aranesp in past. Related to CKD. Follow labs. No active bleeding.  8. Diet-more alert now. NPO except meds.  9. Code status-Full per family.   Total critical care time: start 2015. End 2110. Total 55 mins.  Critical care time was exclusive of separately billable procedures and treating other patients. Critical care was necessary to treat or prevent imminent or life-threatening deterioration. Critical care was time spent personally by me on the following activities: development of treatment plan with patient and/or surrogate as well as nursing, discussions with consultants, evaluation of patient's response to treatment, examination of patient, obtaining history from patient or surrogate, ordering and performing treatments and interventions, ordering and review of laboratory studies, ordering and review of radiographic studies, pulse oximetry and re-evaluation  of patient's condition. KJKG, NP triad  8. Hypertension-hold meds.

## 2019-02-24 NOTE — ED Notes (Signed)
Pt also has swelling and redness noted to right jaw extending back to her right ear.

## 2019-02-25 ENCOUNTER — Inpatient Hospital Stay (HOSPITAL_COMMUNITY): Payer: Medicare Other

## 2019-02-25 ENCOUNTER — Encounter (HOSPITAL_COMMUNITY): Payer: Self-pay | Admitting: Oncology

## 2019-02-25 DIAGNOSIS — E7220 Disorder of urea cycle metabolism, unspecified: Secondary | ICD-10-CM

## 2019-02-25 DIAGNOSIS — E039 Hypothyroidism, unspecified: Secondary | ICD-10-CM

## 2019-02-25 DIAGNOSIS — D539 Nutritional anemia, unspecified: Secondary | ICD-10-CM

## 2019-02-25 DIAGNOSIS — B9561 Methicillin susceptible Staphylococcus aureus infection as the cause of diseases classified elsewhere: Secondary | ICD-10-CM

## 2019-02-25 DIAGNOSIS — I129 Hypertensive chronic kidney disease with stage 1 through stage 4 chronic kidney disease, or unspecified chronic kidney disease: Secondary | ICD-10-CM

## 2019-02-25 DIAGNOSIS — E872 Acidosis: Secondary | ICD-10-CM

## 2019-02-25 DIAGNOSIS — Z96651 Presence of right artificial knee joint: Secondary | ICD-10-CM

## 2019-02-25 DIAGNOSIS — Z9621 Cochlear implant status: Secondary | ICD-10-CM

## 2019-02-25 DIAGNOSIS — N179 Acute kidney failure, unspecified: Secondary | ICD-10-CM

## 2019-02-25 DIAGNOSIS — N184 Chronic kidney disease, stage 4 (severe): Secondary | ICD-10-CM

## 2019-02-25 DIAGNOSIS — Z87891 Personal history of nicotine dependence: Secondary | ICD-10-CM

## 2019-02-25 DIAGNOSIS — R188 Other ascites: Secondary | ICD-10-CM

## 2019-02-25 DIAGNOSIS — G9340 Encephalopathy, unspecified: Secondary | ICD-10-CM

## 2019-02-25 DIAGNOSIS — N189 Chronic kidney disease, unspecified: Secondary | ICD-10-CM

## 2019-02-25 DIAGNOSIS — R7881 Bacteremia: Secondary | ICD-10-CM

## 2019-02-25 DIAGNOSIS — D696 Thrombocytopenia, unspecified: Secondary | ICD-10-CM

## 2019-02-25 DIAGNOSIS — K7460 Unspecified cirrhosis of liver: Secondary | ICD-10-CM

## 2019-02-25 LAB — GLUCOSE, CAPILLARY
GLUCOSE-CAPILLARY: 118 mg/dL — AB (ref 70–99)
Glucose-Capillary: 75 mg/dL (ref 70–99)
Glucose-Capillary: 76 mg/dL (ref 70–99)
Glucose-Capillary: 47 mg/dL — ABNORMAL LOW (ref 70–99)
Glucose-Capillary: 116 mg/dL — ABNORMAL HIGH (ref 70–99)
Glucose-Capillary: 97 mg/dL (ref 70–99)
Glucose-Capillary: 77 mg/dL (ref 70–99)

## 2019-02-25 LAB — POCT I-STAT 7, (LYTES, BLD GAS, ICA,H+H)
ACID-BASE DEFICIT: 18 mmol/L — AB (ref 0.0–2.0)
Acid-base deficit: 12 mmol/L — ABNORMAL HIGH (ref 0.0–2.0)
Bicarbonate: 12 mmol/L — ABNORMAL LOW (ref 20.0–28.0)
Bicarbonate: 15.1 mmol/L — ABNORMAL LOW (ref 20.0–28.0)
Calcium, Ion: 1.02 mmol/L — ABNORMAL LOW (ref 1.15–1.40)
Calcium, Ion: 0.98 mmol/L — ABNORMAL LOW (ref 1.15–1.40)
HCT: 24 % — ABNORMAL LOW (ref 36.0–46.0)
HCT: 25 % — ABNORMAL LOW (ref 36.0–46.0)
Hemoglobin: 8.2 g/dL — ABNORMAL LOW (ref 12.0–15.0)
Hemoglobin: 8.5 g/dL — ABNORMAL LOW (ref 12.0–15.0)
O2 Saturation: 44 %
O2 Saturation: 89 %
PO2 ART: 28 mmHg — AB (ref 83.0–108.0)
Patient temperature: 93
Patient temperature: 93
Potassium: 3.4 mmol/L — ABNORMAL LOW (ref 3.5–5.1)
Potassium: 3.2 mmol/L — ABNORMAL LOW (ref 3.5–5.1)
SODIUM: 142 mmol/L (ref 135–145)
Sodium: 140 mmol/L (ref 135–145)
TCO2: 13 mmol/L — ABNORMAL LOW (ref 22–32)
TCO2: 16 mmol/L — ABNORMAL LOW (ref 22–32)
pCO2 arterial: 40 mmHg (ref 32.0–48.0)
pCO2 arterial: 32.8 mmHg (ref 32.0–48.0)
pH, Arterial: 7.066 — CL (ref 7.350–7.450)
pH, Arterial: 7.253 — ABNORMAL LOW (ref 7.350–7.450)
pO2, Arterial: 54 mmHg — ABNORMAL LOW (ref 83.0–108.0)

## 2019-02-25 LAB — BLOOD CULTURE ID PANEL (REFLEXED)
Acinetobacter baumannii: NOT DETECTED
CANDIDA ALBICANS: NOT DETECTED
CANDIDA TROPICALIS: NOT DETECTED
Candida glabrata: NOT DETECTED
Candida krusei: NOT DETECTED
Candida parapsilosis: NOT DETECTED
Enterobacter cloacae complex: NOT DETECTED
Enterobacteriaceae species: NOT DETECTED
Enterococcus species: NOT DETECTED
Escherichia coli: NOT DETECTED
HAEMOPHILUS INFLUENZAE: NOT DETECTED
Klebsiella oxytoca: NOT DETECTED
Klebsiella pneumoniae: NOT DETECTED
Listeria monocytogenes: NOT DETECTED
METHICILLIN RESISTANCE: NOT DETECTED
Neisseria meningitidis: NOT DETECTED
Proteus species: NOT DETECTED
Pseudomonas aeruginosa: NOT DETECTED
Serratia marcescens: NOT DETECTED
Staphylococcus aureus (BCID): DETECTED — AB
Staphylococcus species: DETECTED — AB
Streptococcus agalactiae: NOT DETECTED
Streptococcus pneumoniae: NOT DETECTED
Streptococcus pyogenes: NOT DETECTED
Streptococcus species: NOT DETECTED

## 2019-02-25 LAB — FERRITIN: Ferritin: 131 ng/mL (ref 11–307)

## 2019-02-25 LAB — BASIC METABOLIC PANEL
ANION GAP: 20 — AB (ref 5–15)
Anion gap: 15 (ref 5–15)
BUN: 41 mg/dL — ABNORMAL HIGH (ref 8–23)
BUN: 42 mg/dL — ABNORMAL HIGH (ref 8–23)
CO2: 14 mmol/L — ABNORMAL LOW (ref 22–32)
CO2: 10 mmol/L — ABNORMAL LOW (ref 22–32)
Calcium: 7 mg/dL — ABNORMAL LOW (ref 8.9–10.3)
Calcium: 7.5 mg/dL — ABNORMAL LOW (ref 8.9–10.3)
Chloride: 111 mmol/L (ref 98–111)
Chloride: 112 mmol/L — ABNORMAL HIGH (ref 98–111)
Creatinine, Ser: 2.6 mg/dL — ABNORMAL HIGH (ref 0.44–1.00)
Creatinine, Ser: 2.73 mg/dL — ABNORMAL HIGH (ref 0.44–1.00)
GFR calc Af Amer: 20 mL/min — ABNORMAL LOW (ref >60)
GFR calc Af Amer: 19 mL/min — ABNORMAL LOW (ref >60)
GFR calc non Af Amer: 17 mL/min — ABNORMAL LOW (ref >60)
GFR calc non Af Amer: 16 mL/min — ABNORMAL LOW (ref >60)
GLUCOSE: 77 mg/dL (ref 70–99)
Glucose, Bld: 81 mg/dL (ref 70–99)
Potassium: 3.1 mmol/L — ABNORMAL LOW (ref 3.5–5.1)
Potassium: 4.1 mmol/L (ref 3.5–5.1)
Sodium: 140 mmol/L (ref 135–145)
Sodium: 142 mmol/L (ref 135–145)

## 2019-02-25 LAB — HEMOGLOBIN A1C
HEMOGLOBIN A1C: 4.1 % — AB (ref 4.8–5.6)
Mean Plasma Glucose: 70.97 mg/dL

## 2019-02-25 LAB — CBC
HCT: 21.9 % — ABNORMAL LOW (ref 36.0–46.0)
Hemoglobin: 7.2 g/dL — ABNORMAL LOW (ref 12.0–15.0)
MCH: 41.6 pg — ABNORMAL HIGH (ref 26.0–34.0)
MCHC: 32.9 g/dL (ref 30.0–36.0)
MCV: 126.6 fL — ABNORMAL HIGH (ref 80.0–100.0)
Platelets: 60 10*3/uL — ABNORMAL LOW (ref 150–400)
RBC: 1.73 MIL/uL — ABNORMAL LOW (ref 3.87–5.11)
RDW: 26.3 % — ABNORMAL HIGH (ref 11.5–15.5)
WBC: 16.5 10*3/uL — ABNORMAL HIGH (ref 4.0–10.5)
nRBC: 1.7 % — ABNORMAL HIGH (ref 0.0–0.2)

## 2019-02-25 LAB — BILIRUBIN, FRACTIONATED(TOT/DIR/INDIR)
Bilirubin, Direct: 1.9 mg/dL — ABNORMAL HIGH (ref 0.0–0.2)
Indirect Bilirubin: 1 mg/dL — ABNORMAL HIGH (ref 0.3–0.9)
Total Bilirubin: 2.9 mg/dL — ABNORMAL HIGH (ref 0.3–1.2)

## 2019-02-25 LAB — PROTIME-INR
INR: 1.9 — ABNORMAL HIGH (ref 0.8–1.2)
Prothrombin Time: 21.6 seconds — ABNORMAL HIGH (ref 11.4–15.2)

## 2019-02-25 LAB — RETICULOCYTES
Immature Retic Fract: 30.6 % — ABNORMAL HIGH (ref 2.3–15.9)
RBC.: 1.71 MIL/uL — ABNORMAL LOW (ref 3.87–5.11)
RETIC CT PCT: 5.2 % — AB (ref 0.4–3.1)
Retic Count, Absolute: 89.1 10*3/uL (ref 19.0–186.0)

## 2019-02-25 LAB — IRON AND TIBC
Iron: 21 ug/dL — ABNORMAL LOW (ref 28–170)
Saturation Ratios: 9 % — ABNORMAL LOW (ref 10.4–31.8)
TIBC: 235 ug/dL — ABNORMAL LOW (ref 250–450)
UIBC: 214 ug/dL

## 2019-02-25 LAB — MRSA PCR SCREENING: MRSA by PCR: NEGATIVE

## 2019-02-25 LAB — MAGNESIUM: Magnesium: 1.6 mg/dL — ABNORMAL LOW (ref 1.7–2.4)

## 2019-02-25 LAB — AMMONIA
Ammonia: 88 umol/L — ABNORMAL HIGH (ref 9–35)
Ammonia: 72 umol/L — ABNORMAL HIGH (ref 9–35)

## 2019-02-25 LAB — APTT: APTT: 62 s — AB (ref 24–36)

## 2019-02-25 LAB — LACTIC ACID, PLASMA: Lactic Acid, Venous: 7.6 mmol/L (ref 0.5–1.9)

## 2019-02-25 LAB — DIRECT ANTIGLOBULIN TEST (NOT AT ARMC)
DAT, IgG: NEGATIVE
DAT, complement: NEGATIVE

## 2019-02-25 LAB — ALBUMIN: ALBUMIN: 2.4 g/dL — AB (ref 3.5–5.0)

## 2019-02-25 LAB — CORTISOL: CORTISOL PLASMA: 34.9 ug/dL

## 2019-02-25 LAB — PHOSPHORUS: Phosphorus: 4.6 mg/dL (ref 2.5–4.6)

## 2019-02-25 LAB — FIBRINOGEN: Fibrinogen: 188 mg/dL — ABNORMAL LOW (ref 210–475)

## 2019-02-25 LAB — PROCALCITONIN: Procalcitonin: 62.12 ng/mL

## 2019-02-25 MED ORDER — LACTULOSE 10 GM/15ML PO SOLN
30.0000 g | Freq: Three times a day (TID) | ORAL | Status: DC
  Administered 2019-02-25 – 2019-02-26 (×5): 30 g
  Filled 2019-02-25 (×5): qty 45

## 2019-02-25 MED ORDER — MAGNESIUM SULFATE 2 GM/50ML IV SOLN
2.0000 g | Freq: Once | INTRAVENOUS | Status: AC
  Administered 2019-02-25: 2 g via INTRAVENOUS
  Filled 2019-02-25: qty 50

## 2019-02-25 MED ORDER — SODIUM BICARBONATE 8.4 % IV SOLN
INTRAVENOUS | Status: DC
  Filled 2019-02-25: qty 150

## 2019-02-25 MED ORDER — POTASSIUM CHLORIDE 10 MEQ/100ML IV SOLN
10.0000 meq | INTRAVENOUS | Status: AC
  Administered 2019-02-25 (×3): 10 meq via INTRAVENOUS
  Filled 2019-02-25 (×3): qty 100

## 2019-02-25 MED ORDER — SODIUM BICARBONATE 8.4 % IV SOLN
INTRAVENOUS | Status: DC
  Administered 2019-02-25: 04:00:00 via INTRAVENOUS
  Filled 2019-02-25: qty 150

## 2019-02-25 MED ORDER — CHLORHEXIDINE GLUCONATE CLOTH 2 % EX PADS
6.0000 | MEDICATED_PAD | Freq: Every day | CUTANEOUS | Status: DC
  Administered 2019-02-25 – 2019-03-02 (×6): 6 via TOPICAL

## 2019-02-25 MED ORDER — ORAL CARE MOUTH RINSE
15.0000 mL | Freq: Two times a day (BID) | OROMUCOSAL | Status: DC
  Administered 2019-02-25 – 2019-02-27 (×5): 15 mL via OROMUCOSAL

## 2019-02-25 MED ORDER — LACTATED RINGERS IV SOLN
INTRAVENOUS | Status: DC
  Administered 2019-02-25: 02:00:00 via INTRAVENOUS

## 2019-02-25 MED ORDER — SODIUM BICARBONATE 650 MG PO TABS: 650.0000 mg | ORAL_TABLET | Freq: Two times a day (BID) | ORAL | Status: DC

## 2019-02-25 MED ORDER — CEFAZOLIN SODIUM-DEXTROSE 2-4 GM/100ML-% IV SOLN
2.0000 g | Freq: Two times a day (BID) | INTRAVENOUS | Status: DC
  Administered 2019-02-25 – 2019-03-03 (×12): 2 g via INTRAVENOUS
  Filled 2019-02-25 (×12): qty 100

## 2019-02-25 MED ORDER — SODIUM CHLORIDE 0.9 % IV SOLN
INTRAVENOUS | Status: DC | PRN
  Administered 2019-02-25: 500 mL via INTRAVENOUS

## 2019-02-25 MED ORDER — LACTULOSE ENEMA
300.0000 mL | Freq: Once | ORAL | Status: DC
  Filled 2019-02-25: qty 300

## 2019-02-25 MED ORDER — RIFAXIMIN 550 MG PO TABS
550.0000 mg | ORAL_TABLET | Freq: Two times a day (BID) | ORAL | Status: DC
  Administered 2019-02-25 – 2019-02-26 (×3): 550 mg via ORAL
  Filled 2019-02-25 (×3): qty 1

## 2019-02-25 MED ORDER — SODIUM BICARBONATE 8.4 % IV SOLN
INTRAVENOUS | Status: AC
  Administered 2019-02-25: 03:00:00
  Filled 2019-02-25: qty 100

## 2019-02-25 MED ORDER — SODIUM BICARBONATE 8.4 % IV SOLN
100.0000 meq | Freq: Once | INTRAVENOUS | Status: AC
  Administered 2019-02-25: 100 meq via INTRAVENOUS

## 2019-02-25 MED ORDER — PANTOPRAZOLE SODIUM 40 MG IV SOLR
40.0000 mg | Freq: Every day | INTRAVENOUS | Status: DC
  Administered 2019-02-25 – 2019-02-26 (×2): 40 mg via INTRAVENOUS
  Filled 2019-02-25 (×2): qty 40

## 2019-02-25 MED ORDER — SODIUM BICARBONATE 8.4 % IV SOLN
INTRAVENOUS | Status: DC
  Administered 2019-02-25 – 2019-02-26 (×3): via INTRAVENOUS
  Filled 2019-02-25 (×2): qty 150

## 2019-02-25 MED ORDER — METRONIDAZOLE IN NACL 5-0.79 MG/ML-% IV SOLN
500.0000 mg | Freq: Three times a day (TID) | INTRAVENOUS | Status: DC
  Administered 2019-02-25 (×2): 500 mg via INTRAVENOUS
  Filled 2019-02-25 (×2): qty 100

## 2019-02-25 MED ORDER — DEXTROSE 50 % IV SOLN
1.0000 | INTRAVENOUS | Status: DC | PRN
  Administered 2019-02-25: 50 mL via INTRAVENOUS
  Filled 2019-02-25: qty 50

## 2019-02-25 MED ORDER — LEVOTHYROXINE SODIUM 75 MCG PO TABS: 37.5000 ug | ORAL_TABLET | Freq: Every day | ORAL | Status: DC

## 2019-02-25 NOTE — Consult Note (Addendum)
[place]  [phone] [phone]    INITIAL HEMATOLOGY CONSULTATION  Referring MD: Dr. Chesley Mires   Reason for Referral: Anemia and thrombocytopenia.   HPI: [name] is a 73 y.o. female who has a PMH as outlined below including HTN, CKD IV, hypothyroidism, anemia. Daughter reports history of liver disease with prior evaluation by Hepatology at [facility] over 10 years ago. Daughter states that the patient had a liver biopsy which did not show cirrhosis. Has not required additional follow up with GI or Hepatology in years.  She presented to [facility] ED 03/04/2019 with confusion that began earlier that morning.  She was found to have hyperammonemia with AGMA with elevated lactate.  She had RUQ [place] that suggested cirrhosis. Pt has no hx of EtOH   She was admitted by [facility] and was evaluated by nephrology who did not feel that her encephalopathy was in large part due to uremia, but rather from her hyperammonemia. Lactulose was recommended. Later that evening, she had a lactate of 7.1.  She received 1L bolus and repeat was 7.2  Pt became more somnolent and per TRH and RN on the floor, she appeared more toxic.  CT of the abdomen and pelvis was obtained and demonstrated hepatic cirrhosis with moderate ascites and colonic pneumatosis suspicious for ischemia.  General surgery was consulted and was not overly concerned for ischemia.  Supportive care was recommended. She was subsequently transferred to the ICU for further management. She has MSSA bacteremia.   Review of chart shows intermittent mildly decreased platelets - lowest was 126, 000 on 01/23/2018. Has had anemia dating back to at least 2014 (oldest records available to me). Highest Hgb was 12.0 (was receiving Aranesp at the time). Most recent hemoglobin prior to admission was 9.7 on 12/25/2018    Patient somnolent. History obtained form daughter, Derl Barrow. Daughter states that the patient developed confusion yesterday morning.  She had seen her the day prior and the patient had no confusion. She has not had any fevers or chills. Appetite has not decreased and she has not lost weight. She has not had any chest pain, shortness of breath, cough, abdominal discomfort, nausea, vomiting, constipation, or diarrhea. Has not noticed any bleeding. Her only complaint was of right knee pain. Hematology was asked to see the patient for recommendations regarding her anemia and thrombocytopenia.    Past Medical History:  Diagnosis Date  . Anemia    Has received Aranesp in the past  . Hypertension   . Liver disease    Family reports eval at [facility] by Hepatology >10 years ago. Per family, was told that she did not have cirrhosis.   . Renal disorder   . Thyroid disease      Past Surgical History:  Procedure Laterality Date  . COCHLEAR IMPLANT    . KNEE SURGERY Right     CURRENT MEDS: Current Facility-Administered Medications  Medication Dose Route Frequency Provider Last Rate Last Dose  . acetaminophen (TYLENOL) tablet 650 mg  650 mg Oral Q6H PRN Raiford Noble Latif, DO       Or  . acetaminophen (TYLENOL) suppository 650 mg  650 mg Rectal Q6H PRN Raiford Noble Latif, DO      . ceFEPIme (MAXIPIME) 500 mg in dextrose 5 % 50 mL IVPB  500 mg Intravenous Q24H Romona Curls, RPH 100 mL/hr at 02/25/19 1023 500 mg at 02/25/19 1023  . dextrose 50 % solution 50 mL  1 ampule Intravenous PRN Sood,  Vineet, MD   50 mL at 02/25/19 0802  . lactulose (CHRONULAC) 10 GM/15ML solution 30 g  30 g Per Tube TID Scatliffe, [name], MD   30 g at 02/25/19 0207  . MEDLINE mouth rinse  15 mL Mouth Rinse BID Chesley Mires, MD      . metroNIDAZOLE (FLAGYL) IVPB 500 mg  500 mg Intravenous Q8H Kirby-Graham, Karsten Fells, NP 100 mL/hr at 02/25/19 0309 500 mg at 02/25/19 0309  . ondansetron (ZOFRAN) injection 4 mg  4 mg Intravenous Q6H PRN Sheikh, Omair Latif, DO      . pantoprazole (PROTONIX) injection 40 mg  40 mg Intravenous Daily Scatliffe, Kristen D, MD        . potassium chloride 10 mEq in 100 mL IVPB  10 mEq Intravenous Q1 Hr x 3 Sood, Vineet, MD 100 mL/hr at 02/25/19 1021 10 mEq at 02/25/19 1021  . rifaximin (XIFAXAN) tablet 550 mg  550 mg Oral BID Chesley Mires, MD      . sodium bicarbonate 150 mEq in dextrose 5 % 1,000 mL infusion   Intravenous Continuous Chesley Mires, MD 50 mL/hr at 02/25/19 0808    . sodium bicarbonate 150 mEq in dextrose 5 % 1,000 mL infusion   Intravenous Continuous Rosita Fire, MD      . Derrill Memo ON 02/26/2019] vancomycin (VANCOCIN) 500 mg in sodium chloride 0.9 % 100 mL IVPB  500 mg Intravenous Q48H Elicia Lamp P, RPH         No Known Allergies:  Family History  Problem Relation Age of Onset  . Hypertension Other   . Cervical cancer Mother   . Aneurysm Father   . Emphysema Sister   :   Social History   Socioeconomic History  . Marital status: Widowed    Spouse name: Not on file  . Number of children: 3  . Years of education: Not on file  . Highest education level: Not on file  Occupational History  . Not on file  Social Needs  . Financial resource strain: Not on file  . Food insecurity:    Worry: Not on file    Inability: Not on file  . Transportation needs:    Medical: Not on file    Non-medical: Not on file  Tobacco Use  . Smoking status: Former Smoker    Packs/day: 0.50    Years: 30.00    Pack years: 15.00    Types: Cigarettes    Last attempt to quit: 12/24/2010    Years since quitting: 8.1  . Smokeless tobacco: Never Used  . Tobacco comment: Quit date estimated. Smoked for at least 30 years.   Substance and Sexual Activity  . Alcohol use: Never    Frequency: Never  . Drug use: No  . Sexual activity: Not on file  Lifestyle  . Physical activity:    Days per week: Not on file    Minutes per session: Not on file  . Stress: Not on file  Relationships  . Social connections:    Talks on phone: Not on file    Gets together: Not on file    Attends religious service: Not on file     Active member of club or organization: Not on file    Attends meetings of clubs or organizations: Not on file    Relationship status: Not on file  . Intimate partner violence:    Fear of current or ex partner: Not on file    Emotionally abused:  Not on file    Physically abused: Not on file    Forced sexual activity: Not on file  Other Topics Concern  . Not on file  Social History Narrative   The patient lives in [place]. Has 3 children who all live locally. Daughter, Derl Barrow, lives next door.    REVIEW OF SYSTEMS:   Constitutional: Negative for appetite change, chills, fatigue, fever and unexpected weight change.  HENT:   Negative for mouth sores, nosebleeds, sore throat and trouble swallowing.   Eyes: Negative for eye problems and icterus.  Respiratory: Negative for cough, hemoptysis, shortness of breath and wheezing.   Cardiovascular: Negative for chest pain and leg swelling.  Gastrointestinal: Negative for abdominal pain, constipation, diarrhea, nausea and vomiting.  Genitourinary: Negative for bladder incontinence, difficulty urinating, dysuria, frequency and hematuria.   Musculoskeletal: Reports right knee pain. Negative for back pain, gait problem, neck pain and neck stiffness.  Skin: Negative for itching and rash.  Neurological: Negative for dizziness, extremity weakness, gait problem, headaches, light-headedness and seizures.  Hematological: Negative for adenopathy. Does not bruise/bleed easily.  Psychiatric/Behavioral: Has confusion. Denies depression, sleep disturbance, anxiety.   Exam: Patient Vitals for the past 24 hrs:  BP Temp Temp src Pulse Resp SpO2 Height Weight  02/25/19 0800 100/76 - - 90 18 95 % - -  02/25/19 0700 97/70 (!) 97.3 F (36.3 C) Oral 88 (!) 23 95 % - -  02/25/19 0615 101/75 - - 88 (!) 23 96 % - -  02/25/19 0600 103/69 - - 89 (!) 25 95 % - -  02/25/19 0545 103/75 - - 87 (!) 22 94 % - -  02/25/19 0530 107/77 - - 88 (!) 22 94 % - -  02/25/19 0515  110/61 - - 88 (!) 21 94 % - -  02/25/19 0500 110/71 - - 87 19 93 % - -  02/25/19 0445 105/71 - - 88 20 93 % - -  02/25/19 0430 107/71 - - 88 (!) 21 92 % - -  02/25/19 0419 - (!) 93.6 F (34.2 C) Axillary - - - - -  02/25/19 0415 114/68 - - 88 20 90 % - -  02/25/19 0400 108/78 - - 87 20 92 % - -  02/25/19 0345 109/71 - - 87 17 93 % - -  02/25/19 0330 111/78 - - 86 19 92 % - -  02/25/19 0315 106/71 - - 86 18 94 % - -  02/25/19 0300 111/68 - - - 18 - - -  02/25/19 0245 112/78 - - 85 18 93 % - -  02/25/19 0240 108/79 - - 85 19 92 % - -  02/25/19 0235 113/76 - - 85 20 (!) 88 % - -  02/25/19 0230 114/79 - - 88 18 (!) 63 % - -  02/25/19 0225 111/72 - - 86 20 91 % - -  02/25/19 0220 (!) 108/95 - - 87 19 92 % - -  02/25/19 0215 (!) 114/95 - - - 20 - - -  02/25/19 0210 119/83 - - - 19 - - -  02/25/19 0205 120/68 - - - 15 - - -  02/25/19 0200 120/61 - - - 18 - - -  02/25/19 0155 - - - 77 16 - - -  02/25/19 0150 (!) 105/59 - - - 18 - - -  02/25/19 0140 110/88 - - - 20 - - -  02/25/19 0135 114/73 - - - (!) 23 - - -  02/25/19 0130 110/75 - - - 12 - - -  03/05/2019 2130 106/81 - - - (!) 22 - - -  03/10/2019 2057 - 97.9 F (36.6 C) - - - - - -  03/09/2019 1930 106/61 - - - 20 - - -  03/06/2019 1900 126/76 - - - 18 96 % - -  03/03/2019 1705 - (!) 96.7 F (35.9 C) Rectal - - - - -  03/09/2019 1700 130/69 - - - (!) 21 - - -  03/02/2019 1600 (!) 116/94 - - 92 14 92 % - -  03/05/2019 1456 - - - - - - $R'5\' 1"'JR$  (1.549 m) 107 lb 5.8 oz (48.7 kg)  03/21/2019 1454 (!) 110/94 98.5 F (36.9 C) Oral 99 19 92 % - -    General: no acute distress.  Eyes:  no scleral icterus.  ENT:  There were no oropharyngeal lesions.  Neck was without thyromegaly.  Lymphatics:  Negative cervical, supraclavicular or axillary adenopathy.  Respiratory: decreased breath sounds.  Cardiovascular:  Regular rate and rhythm, S1/S2, without murmur, rub or gallop.  There was trace pedal edema.  GI:  abdomen was soft, nontender, mildly distended, without  organomegaly.  Musculoskeletal:  no spinal tenderness of palpation of vertebral spine.  Skin exam was without petechiae. Ecchymosis noted with right inner thigh.  Neuro exam was nonfocal.  Patient responded to questions. Confused.    LABS:  Lab Results  Component Value Date   WBC 16.5 (H) 02/25/2019   HGB 8.5 (L) 02/25/2019   HCT 25.0 (L) 02/25/2019   PLT 60 (L) 02/25/2019   GLUCOSE 77 02/25/2019   ALT 35 03/21/2019   AST 68 (H) 02/25/2019   NA 142 02/25/2019   K 3.2 (L) 02/25/2019   CL 111 02/25/2019   CREATININE 2.60 (H) 02/25/2019   BUN 41 (H) 02/25/2019   CO2 14 (L) 02/25/2019   INR 1.9 (H) 02/25/2019   HGBA1C 4.1 (L) 02/25/2019   Total Bilirubin 2.9, direct bilirubin 1.9, indirect bilirubin 1.0 Fibrinogen 188 PTT 62 PT/INR 21.6/1.9 Absolute reticulocyte 89.1 LDH 482 DAT, IgG negative; DAT complement neg [id number] pending Hepatitis panel pending Haptoglobin pending  Ct Abdomen Pelvis Wo Contrast  Result Date: 02/25/2019 CLINICAL DATA:  Cirrhosis or Fatty Liver liver enzymes up in past. has never been told "cirrhosis" before. Was turned loose by GI over a year ago. Now, US shows cirrhosis. No ETOH. ammonia level up. Sepsis of unknown origin. EXAM: CT ABDOMEN AND PELVIS WITHOUT CONTRAST TECHNIQUE: Multidetector CT imaging of the abdomen and pelvis was performed following the standard protocol without IV contrast. COMPARISON:  Abdominal ultrasound earlier this day. CT 01/23/2028 FINDINGS: Lower chest: Small bilateral pleural effusions. Coronary artery calcifications. Hepatobiliary: Macrolobular hepatic contours consistent with cirrhosis. No obvious hepatic lesion on noncontrast exam. Distended gallbladder with layering hyperdensity. Motion obscures evaluation of the biliary tree. Pancreas: Motion degraded evaluation. Parenchymal atrophy. Motion versus edema at the pancreatic head, image 35 series 3. Spleen: Normal in size. No gross focal abnormality. Adrenals/Urinary Tract: Left  adrenal thickening without dominant nodule. Normal right adrenal gland. Bilateral renal cortical atrophy. No hydronephrosis. Small cyst in the left kidney. Tiny hyperdensity in the upper left kidney. Previous hyperdense lesion in the lower right kidney is not as well seen, may represent resolution of a hemorrhagic cyst. Urinary bladder physiologically distended. No bladder wall thickening. Stomach/Bowel: Patient motion artifact, paucity of intra-abdominal fat, intra-abdominal ascites, and lack of contrast significantly limits bowel assessment. Suspect gastric wall thickening, not well  assessed. No small bowel obstruction. There is air in stool throughout the colon, occasionally air appears slightly circumferential, for example at the hepatic flexure, image 6 series 3. Probable wall thickening of the ascending colon. The appendix is visualized and normal. There is pelvic floor descent with rectocele. Vascular/Lymphatic: Advanced aorta bi-iliac atherosclerosis. No portal venous or mesenteric gas. Significantly limited assessment for adenopathy. Reproductive: Atrophic uterus, normal for age. Adnexa not well assessed. Other: Pelvic floor descent. Moderate abdominopelvic ascites. No evidence for free air, however evaluation significantly limited. Musculoskeletal: Anterolisthesis of L5 on S1 is likely facet mediated. Degenerative disc disease at L3-L4 and L5-S1. IMPRESSION: 1. Hepatic cirrhosis with moderate ascites. 2. Colonic wall thickening of the ascending colon. Air in stool throughout the colon, some of this air appears circumferential. While this may represent air mixed with stool, in the setting of sepsis and lactic acidosis, colonic pneumatosis is considered and is suspicious for ischemia. Bowel evaluation is technically limited given patient motion, lack of IV contrast, abdominal ascites and paucity of intra-abdominal fat. No evidence of free air. 3. Pelvic floor descent with rectocele. 4. Advanced aorta  bi-iliac atherosclerosis. Aortic Atherosclerosis (ICD10-I70.0). 5. Additional chronic findings as described. Critical Value/emergent results were called by telephone at the time of interpretation on 02/25/2019 at 12:01 am to NP [facility] , who verbally acknowledged these results. Electronically Signed   By: Keith Rake M.D.   On: 02/25/2019 00:02   Dg Chest 2 View  Result Date: 03/06/2019 CLINICAL DATA:  Altered mental status EXAM: CHEST - 2 VIEW COMPARISON:  06/21/2018 FINDINGS: Decreased lung volume with mild bibasilar atelectasis. Negative for heart failure or effusion. Negative for pneumonia. IMPRESSION: Hypoventilation with mild bibasilar atelectasis. Electronically Signed   By: Franchot Gallo M.D.   On: 03/01/2019 17:03   Dg Pelvis 1-2 Views  Result Date: 03/14/2019 CLINICAL DATA:  Pain EXAM: PELVIS - 1-2 VIEW COMPARISON:  CT abdomen pelvis 01/22/2018 FINDINGS: Both hips are normal. No pelvic fracture or mass. Moderate to advanced degenerative change in the SI joints bilaterally. Lumbar scoliosis and degenerative change. IMPRESSION: Negative for fracture. Electronically Signed   By: Franchot Gallo M.D.   On: 03/13/2019 17:06   Dg Abd 1 View  Result Date: 02/25/2019 CLINICAL DATA:  NG tube placement. EXAM: ABDOMEN - 1 VIEW COMPARISON:  CT earlier this day. FINDINGS: Tip and side port of the enteric tube below the diaphragm in the stomach. Air in stool throughout the colon. Question pneumatosis on CT not confirmed on radiograph. Right abdomen not entirely included in the field of view. No evidence of free air. IMPRESSION: Tip and side port of the enteric tube below the diaphragm in the stomach. Electronically Signed   By: Keith Rake M.D.   On: 02/25/2019 02:20   Ct Head Wo Contrast  Result Date: 03/12/2019 CLINICAL DATA:  AMS, no trauma EXAM: CT HEAD WITHOUT CONTRAST TECHNIQUE: Contiguous axial images were obtained from the base of the skull through the vertex without intravenous  contrast. COMPARISON:  None. FINDINGS: Brain: Left-sided cochlear implant and related dense metallic streak artifact somewhat limits evaluation of the left hemispheric brain parenchyma. No evidence of acute infarction, hemorrhage, hydrocephalus, extra-axial collection or mass lesion/mass effect. Vascular: No hyperdense vessel or unexpected calcification. Skull: Normal. Negative for fracture or focal lesion. Sinuses/Orbits: No acute finding. Other: None. IMPRESSION: Left-sided cochlear implant and related dense metallic streak artifact somewhat limits evaluation of the left hemispheric brain parenchyma. Within this limitation, no acute intracranial pathology. Electronically Signed  By: Eddie Candle M.D.   On: 03/06/2019 15:54   Ct Femur Right Wo Contrast  Result Date: 03/15/2019 CLINICAL DATA:  Sepsis of unknown origin EXAM: CT OF THE LOWER RIGHT EXTREMITY WITHOUT CONTRAST TECHNIQUE: Multidetector CT imaging of the right lower extremity was performed according to the standard protocol. COMPARISON:  Radiograph 03/23/2019 FINDINGS: Bones/Joint/Cartilage Nondiagnostic images of the distal femur at the level of the knee secondary to patient motion. Imaged portions of the proximal mid and distal femur show no fracture or periostitis. No bony destruction is evident. No significant hip effusion. Ligaments Suboptimally assessed by CT. Muscles and Tendons Mild atrophy of the hip and thigh muscles. No gross intramuscular fluid collections. Soft tissues Diffuse subcutaneous edema. No focal fluid collections. Ascites within the abdomen and pelvis. IMPRESSION: Motion degraded study despite several imaging attempts. Nondiagnostic evaluation of the distal femur at the level of the prosthesis. Imaged portions of the proximal mid and distal right femur show no acute osseous abnormality. Non-specific edema within the subcutaneous soft tissues without gross focal fluid collection. Electronically Signed   By: Donavan Foil M.D.    On: 03/05/2019 23:50   Dg Femur Min 2 Views Right  Result Date: 03/11/2019 CLINICAL DATA:  Pain EXAM: RIGHT FEMUR 2 VIEWS COMPARISON:  None. FINDINGS: Normal right hip joint. No femur fracture. Right knee replacement satisfactory position alignment without loosening. IMPRESSION: No acute abnormality.  Right knee replacement. Electronically Signed   By: Franchot Gallo M.D.   On: 03/08/2019 17:05   Ct Maxillofacial Wo Contrast  Result Date: 03/17/2019 CLINICAL DATA:  Right facial swelling. EXAM: CT MAXILLOFACIAL WITHOUT CONTRAST TECHNIQUE: Multidetector CT imaging of the maxillofacial structures was performed. Multiplanar CT image reconstructions were also generated. COMPARISON:  Head CT obtained earlier today. FINDINGS: Osseous: Previously noted left cochlear implant. Reversal of the normal cervical lordosis and multilevel degenerative changes. No acute maxillofacial bony abnormality. The patient is edentulous. Orbits: Unremarkable. Sinuses: Normally aerated. Soft tissues: The right parotid gland is significantly larger than the left parotid gland. On the right, the parotid gland measures 4.2 x 3.1 cm and on the left the parotid gland measures 2.5 x 2.4 cm on image number 49 series 8. There is also adjacent right lateral subcutaneous edema at the level of the parotid gland on the right, extending anteriorly. No parotid stone is visualized. Limited intracranial: Unremarkable. IMPRESSION: 1. Changes of parotitis on the right with no abscess visible, limited by the lack of intravenous contrast. 2. Cervical spine degenerative changes and reversal of the normal cervical lordosis. Electronically Signed   By: Claudie Revering M.D.   On: 02/23/2019 17:44   US Abdomen Limited Ruq  Result Date: 02/27/2019 CLINICAL DATA:  Right upper quadrant pain x1 day EXAM: ULTRASOUND ABDOMEN LIMITED RIGHT UPPER QUADRANT COMPARISON:  01/22/2018 CT FINDINGS: Limited study as the patient deferred repositioning for better images.  Gallbladder: Distended gallbladder without mural thickening however there is a significant amount of biliary sludge and admixed punctate gallstones noted. No secondary signs of acute cholecystitis. Common bile duct: Not visualized. Liver: Coarsened echotexture with morphologic changes of cirrhosis. No discrete mass. Small to moderate volume of ascites is identified outlining the liver. Portal vein is patent on color Doppler imaging with normal direction of blood flow towards the liver. IMPRESSION: 1. Morphologic changes of cirrhosis with ascites. 2. Biliary sludge and tiny calculi are noted within the distended gallbladder without secondary signs of acute cholecystitis. Electronically Signed   By: Ashley Royalty M.D.   On:  03/10/2019 18:08    ASSESSMENT AND PLAN:   This is a 73 year old African American female with   1. Thrombocytopenia in the setting of sepsis and cirrhosis: Labs reviewed. Platelet count trending up to 60,00 today from 53,000. She has no active bleeding. Differentials include thrombocytopenia related to sepsis, DIC, TTP or ITP. Reticulocyte count normal. No evidence of hemolysis. Will review peripheral smear. Await additional testing including haptoglobin and ADAMTS 13. Transfuse for platelets <10,000  2. Anemia: Likely multifactorial including critical illness, iron deficiency, and chronic disease. Hemoglobin 7.2. No active bleeding. Transfuse for hemoglobin <7.  Thank you for this referral.  Clenton Pare, DNP, AGPCNP-BC, AOCNP   ADDENDUM  .Patient was Personally and independently interviewed, examined and relevant elements of the history of present illness were reviewed in details and an assessment and plan was created. All elements of the patient's history of present illness , assessment and plan were discussed in details with Clenton Pare. The above documentation reflects our combined findings assessment and plan.  LABS  . CBC Latest Ref Rng & Units 02/25/2019 02/25/2019  02/25/2019  WBC 4.0 - 10.5 K/uL - 16.5(H) -  Hemoglobin 12.0 - 15.0 g/dL 4.6(F) 7.2(L) 8.2(L)  Hematocrit 36.0 - 46.0 % 25.0(L) 21.9(L) 24.0(L)  Platelets 150 - 400 K/uL - 60(L) -   . CBC    Component Value Date/Time   WBC 16.5 (H) 02/25/2019 0231   RBC 1.73 (L) 02/25/2019 0231   RBC 1.71 (L) 02/25/2019 0231   HGB 8.5 (L) 02/25/2019 0309   HGB 9.7 (L) 12/25/2018 1304   HCT 25.0 (L) 02/25/2019 0309   HCT 27.4 (L) 12/25/2018 1304   PLT 60 (L) 02/25/2019 0231   PLT 133 (L) 12/25/2018 1304   MCV 126.6 (H) 02/25/2019 0231   MCV 121 (H) 12/25/2018 1304   MCH 41.6 (H) 02/25/2019 0231   MCHC 32.9 02/25/2019 0231   RDW 26.3 (H) 02/25/2019 0231   RDW 16.8 (H) 12/25/2018 1304   LYMPHSABS 0.7 03/03/2019 2019   LYMPHSABS 0.7 12/25/2018 1304   MONOABS 2.5 (H) 03/13/2019 2019   EOSABS 0.5 03/01/2019 2019   EOSABS 0.0 12/25/2018 1304   BASOSABS 0.0 02/25/2019 2019   BASOSABS 0.0 12/25/2018 1304    . CMP Latest Ref Rng & Units 02/25/2019 02/25/2019 02/25/2019  Glucose 70 - 99 mg/dL 81 - -  BUN 8 - 23 mg/dL 68(L) - -  Creatinine 2.75 - 1.00 mg/dL 1.70(Y) - -  Sodium 174 - 145 mmol/L 142 - 142  Potassium 3.5 - 5.1 mmol/L 4.1 - 3.2(L)  Chloride 98 - 111 mmol/L 112(H) - -  CO2 22 - 32 mmol/L 10(L) - -  Calcium 8.9 - 10.3 mg/dL 7.5(L) - -  Total Protein 6.5 - 8.1 g/dL - - -  Total Bilirubin 0.3 - 1.2 mg/dL - 2.9(H) -  Alkaline Phos 38 - 126 U/L - - -  AST 15 - 41 U/L - - -  ALT 0 - 44 U/L - - -    Lab Results  Component Value Date   RETICCTPCT 5.2 (H) 02/25/2019   RBC 1.73 (L) 02/25/2019   RBC 1.71 (L) 02/25/2019   Patient with  #1 Macrocytic anemia.  Likely multifactorial.  Macrocytosis due to liver cirrhosis, reticulocytosis, thyroid dysfunction. No overt evidence of ongoing uncontrolled hemolysis.  Bilirubin level borderline high but is without significant elevation in indirect bilirubin. Part of the anemia is also related to her chronic kidney disease. Peripheral blood smear shows  some  schistocytes which in her clinical context with a coagulopathy associated and overt sepsis from staph aureus with severely elevated procalcitonin levels and a UTI with E. coli seem to suggest a DIC-like picture.  Low likelihood of TTP.  Also noted to have extensive acanthocytosis consistent with her liver disease some burr cells.  Mild reticulocytosis suggesting possible presence of some GI bleeding. WBCs with myeloid left shift and toxic granules as well as Dohle bodies consistent with her sepsis.  #2 thrombocytopenia-likely multifactorial from sepsis and medications.  Low likelihood of TTP.  Appears to be showing an improving trend.  No overt platelet clumping noted.  #3 liver cirrhosis with ascites #4 chronic kidney disease stage IV #5 altered mental status due to hepatic encephalopathy #6 possible ischemic colitis. Plan -No overt evidence of TTP/HUS spectrum disorder.  No recommendations to consider plasma exchange at this time. -DIC-like picture.  Treat underlying cause which is sepsis.  Compensated DIC could also be from underlying liver cirrhosis.  -If active bleeding might need to use FFP's. -Treat other overt ongoing issues including hepatic encephalopathy causing altered mental status and her sepsis. - 3 months ago her TSH was 128.  TSH in the hospital is now normalized at 4.1. -Check and replace M76 and folic acid as needed -We will monitor counts peripherally. -Kindly call if any specific questions arise. -If significant cytopenias are concerning after acute issues are stabilized will defer to primary care doctor regarding need for additional outpatient hematology cares.   Sullivan Lone MD MS

## 2019-02-25 NOTE — Progress Notes (Signed)
Pharmacy Antibiotic Note  [name] is a 73 y.o. female admitted on 03/22/2019 with MSSA bacteremia.Pharmacy has been consulted for cefazolin dosing.WBC down to 14.4 today ant patient temps low. SCr is 2.60 today which is around baseline. CrCl ~ 14 ml/min. No plans for HD currently.   Plan: Cefazolin 2 gm every 12 hours  Monitor renal function, clinical progress, repeat cultures   Height: $Remove'5\' 1"'qoKwzuV$  (154.9 cm) Weight: 107 lb 5.8 oz (48.7 kg) IBW/kg (Calculated) : 47.8  Temp (24hrs), Avg:96.9 F (36.1 C), Min:93.6 F (34.2 C), Max:98.5 F (36.9 C)  Recent Labs  Lab 03/02/2019 1512 03/16/2019 1824 03/01/2019 2019 02/22/2019 2224 02/25/19 0231  WBC 14.4*  --  15.3*  --  16.5*  CREATININE 3.37*  --  3.11*  --  2.60*  LATICACIDVEN  --  7.1*  --  7.2* 7.6*    Estimated Creatinine Clearance: 14.3 mL/min (A) (by C-G formula based on SCr of 2.6 mg/dL (H)).    No Known Allergies  Antimicrobials this admission: Vanc 3/3 >>3/4 Cefepime 3/4> 3/4 Metronidazole 3/4>>3/4 Cefazolin 3/4>>    Microbiology results: 3/3  BCx: 2/4 gm positive cocci (BCID- MSSA)   Thank you for allowing pharmacy to be a part of this patient's care.  Jimmy Footman, PharmD, BCPS, BCIDP Infectious Diseases Clinical Pharmacist Phone: [phone] 02/25/2019 12:56 PM

## 2019-02-25 NOTE — Evaluation (Deleted)
Clinical/Bedside Swallow Evaluation Patient Details  Name: [name] MRN: [id number] Date of Birth: 08-27-1946  Today's Date: 02/25/2019 Time: SLP Start Time (ACUTE ONLY): 0845 SLP Stop Time (ACUTE ONLY): 0905 SLP Time Calculation (min) (ACUTE ONLY): 20 min  Past Medical History:  Past Medical History:  Diagnosis Date  . Hypertension   . Liver disease   . Renal disorder   . Thyroid disease    Past Surgical History:  Past Surgical History:  Procedure Laterality Date  . COCHLEAR IMPLANT    . KNEE SURGERY     HPI:  Pt is a 73 y.o. female with PMH of hypertension, questionable liver disease, chronic kidney disease stage IV, hypothyroidism, anemia chronic kidney disease, as well as other comorbidities who presents with a chief complaint of confusion. Pt found to have hyperammonemia with AGMA with elevated lactate.  She had RUQ [place] that suggested cirrhosis.   Assessment / Plan / Recommendation Clinical Impression  Pt, presenting with decreased mentation secondary to hyperammonemia, seen for bedside swallow evaluation with daughter present who provided medical hx. Pt unable to follow 1- step directions to complete oral mechanism examination despite max verbal/visual cues and displayed poor awareness and attention to POs requiring max tactile and verbal cues to complete all intake. Ice chips yielded no s/sx of aspiration, but small cup/straw sips resulted in immediate coughing with wet vocal quality. Puree tolerated with no observable signs of dysphagia, but pt refused after 2 bites. POs administered through venti-mask as pt desats quickly. Suspect large NG tube impacting swallow function leading to possible aspiration. Recommend NPO with ice chips intermittently throughout the day following oral care and suggest replacement of NG tube with Cortrak to decrease impedence of swallow. Diet advacement indicated with increased attention/awareness to PO intake and stable respiratory status.   SLP  Visit Diagnosis: Dysphagia, unspecified (R13.10)    Aspiration Risk  Moderate aspiration risk    Diet Recommendation NPO(ice chips intermittently)   Medication Administration: Via alternative means Supervision: Staff to assist with self feeding;Full supervision/cueing for compensatory strategies Compensations: Slow rate;Minimize environmental distractions Postural Changes: Seated upright at 90 degrees    Other  Recommendations Oral Care Recommendations: Oral care QID   Follow up Recommendations (TBD)      Frequency and Duration min 2x/week          Prognosis Prognosis for Safe Diet Advancement: Good Barriers to Reach Goals: Cognitive deficits      Swallow Study   General HPI: Pt is a 73 y.o. female with PMH of hypertension, questionable liver disease, chronic kidney disease stage IV, hypothyroidism, anemia chronic kidney disease, as well as other comorbidities who presents with a chief complaint of confusion. Pt found to have hyperammonemia with AGMA with elevated lactate.  She had RUQ [place] that suggested cirrhosis. Type of Study: Bedside Swallow Evaluation Diet Prior to this Study: NPO Temperature Spikes Noted: No Respiratory Status: Venti-mask History of Recent Intubation: No Behavior/Cognition: Confused;Agitated;Uncooperative;Requires cueing;Doesn't follow directions Oral Cavity Assessment: (unable to assess ) Oral Care Completed by SLP: No Oral Cavity - Dentition: Dentures, bottom Self-Feeding Abilities: Total assist;Refused PO Patient Positioning: Upright in bed Baseline Vocal Quality: Normal Volitional Cough: Weak Volitional Swallow: Able to elicit    Oral/Motor/Sensory Function Overall Oral Motor/Sensory Function: Within functional limits   Ice Chips Ice chips: Within functional limits Presentation: Spoon   Thin Liquid Thin Liquid: Impaired Presentation: Cup;Straw Pharyngeal  Phase Impairments: Cough - Immediate    Nectar Thick Nectar Thick Liquid: Not tested  Honey Thick Honey Thick Liquid: Not tested   Puree Puree: Within functional limits Presentation: Spoon   Solid     Solid: Not tested      Ellis Savage, Student SLP 02/25/2019,10:04 AM

## 2019-02-25 NOTE — Progress Notes (Addendum)
[facility] NEPHROLOGY PROGRESS NOTE  Assessment/ Plan: Pt is a 73 y.o. yo female with history of hypertension, hypothyroidism, liver cirrhosis, CKD stage IV with baseline creatinine around 2.5-3 follows with Dr.Webb at [facility], admitted with confusion.  #Acute encephalopathy: Likely due to hepatic encephalopathy and sepsis.  Currently on antibiotics, lactulose per PCCM.  Do not think it is due to uremia.  #CKD stage IV: Serum creatinine level around baseline.  Currently has sepsis.  Monitor BMP, urine output.  Avoid nephrotoxins.  Check bladder scan.  No need for dialysis.  Patient was on oral bicarbonate outpatient, start IV sodium bicarbonate.  #Severe sepsis: Exact source unknown.  Currently on empiric antibiotics.  Has elevated lactic acid level.  Poor primary team.  #Liver cirrhosis: Per PCCM.  #Anemia:: Iron saturation low but unable to give IV iron because of sepsis.  Monitor CBC, transfusion as needed.  Schistocytes are reported in CBC.  Do not think TMA.  Follow-up Adamts13 level.  Recommend hematology consult.  #Thrombocytopenia likely due to liver disease and sepsis.  #Hypokalemia: Receiving IV potassium chloride. Repeat lab.  #Lactic acidosis: Due to sepsis, management as above.   Subjective: Seen and examined in ICU.  Patient is using oxygen mask.  Still some confusion.  Daughter at bedside. Objective Vital signs in last 24 hours: Vitals:   02/25/19 0600 02/25/19 0615 02/25/19 0700 02/25/19 0800  BP: 103/69 101/75 97/70 100/76  Pulse: 89 88 88 90  Resp: (!) 25 (!) 23 (!) 23 18  Temp:   (!) 97.3 F (36.3 C)   TempSrc:   Oral   SpO2: 95% 96% 95% 95%  Weight:      Height:       Weight change:   Intake/Output Summary (Last 24 hours) at 02/25/2019 1011 Last data filed at 02/25/2019 0900 Gross per 24 hour  Intake 845 ml  Output 280 ml  Net 565 ml       Labs: Basic Metabolic Panel: Recent Labs  Lab 03/21/2019 1512 03/10/2019 1726  03/06/2019 2019 02/25/19 0151 02/25/19 0231 02/25/19 0309  NA 136  --  131* 140 140 142  K 2.6*  --  3.4* 3.4* 3.1* 3.2*  CL 103  --  102  --  111  --   CO2 15*  --  14*  --  14*  --   GLUCOSE 120*  --  79  --  77  --   BUN 50*  --  46*  --  41*  --   CREATININE 3.37*  --  3.11*  --  2.60*  --   CALCIUM 8.3*  --  7.8*  --  7.0*  --   PHOS  --  5.1*  --   --  4.6  --    Liver Function Tests: Recent Labs  Lab 03/02/2019 1512 03/04/2019 2019 02/25/19 0231 02/25/19 0724  AST 64* 68*  --   --   ALT 35 35  --   --   ALKPHOS 114 112  --   --   BILITOT 3.0* 3.2*  --  2.9*  PROT 5.5* 5.2*  --   --   ALBUMIN 2.9* 2.8* 2.4*  --    No results for input(s): LIPASE, AMYLASE in the last 168 hours. Recent Labs  Lab 03/14/2019 1512 03/21/2019 2327 02/25/19 0231  AMMONIA 81* 88* 72*   CBC: Recent Labs  Lab 03/02/2019 1512 03/13/2019 2019 02/25/19 0151 02/25/19 0231 02/25/19 0309  WBC 14.4* 15.3*  --  16.5*  --   NEUTROABS 11.4* 11.5*  --   --   --   HGB 8.4* 8.1* 8.2* 7.2* 8.5*  HCT 25.8* 24.0* 24.0* 21.9* 25.0*  MCV 129.0* 129.0*  --  126.6*  --   PLT PLATELET CLUMPS NOTED ON SMEAR, COUNT APPEARS DECREASED 53*  --  60*  --    Cardiac Enzymes: Recent Labs  Lab 03/21/2019 1512  TROPONINI 0.04*   CBG: Recent Labs  Lab 03/03/2019 1553 02/25/19 0208 02/25/19 0416 02/25/19 0751 02/25/19 0906  GLUCAP 82 75 76 47* 116*    Iron Studies:  Recent Labs    03/23/2019 2124  IRON 21*  TIBC 235*  FERRITIN 131   Studies/Results: Ct Abdomen Pelvis Wo Contrast  Result Date: 02/25/2019 CLINICAL DATA:  Cirrhosis or Fatty Liver liver enzymes up in past. has never been told "cirrhosis" before. Was turned loose by GI over a year ago. Now, US shows cirrhosis. No ETOH. ammonia level up. Sepsis of unknown origin. EXAM: CT ABDOMEN AND PELVIS WITHOUT CONTRAST TECHNIQUE: Multidetector CT imaging of the abdomen and pelvis was performed following the standard protocol without IV contrast. COMPARISON:   Abdominal ultrasound earlier this day. CT 01/23/2028 FINDINGS: Lower chest: Small bilateral pleural effusions. Coronary artery calcifications. Hepatobiliary: Macrolobular hepatic contours consistent with cirrhosis. No obvious hepatic lesion on noncontrast exam. Distended gallbladder with layering hyperdensity. Motion obscures evaluation of the biliary tree. Pancreas: Motion degraded evaluation. Parenchymal atrophy. Motion versus edema at the pancreatic head, image 35 series 3. Spleen: Normal in size. No gross focal abnormality. Adrenals/Urinary Tract: Left adrenal thickening without dominant nodule. Normal right adrenal gland. Bilateral renal cortical atrophy. No hydronephrosis. Small cyst in the left kidney. Tiny hyperdensity in the upper left kidney. Previous hyperdense lesion in the lower right kidney is not as well seen, may represent resolution of a hemorrhagic cyst. Urinary bladder physiologically distended. No bladder wall thickening. Stomach/Bowel: Patient motion artifact, paucity of intra-abdominal fat, intra-abdominal ascites, and lack of contrast significantly limits bowel assessment. Suspect gastric wall thickening, not well assessed. No small bowel obstruction. There is air in stool throughout the colon, occasionally air appears slightly circumferential, for example at the hepatic flexure, image 6 series 3. Probable wall thickening of the ascending colon. The appendix is visualized and normal. There is pelvic floor descent with rectocele. Vascular/Lymphatic: Advanced aorta bi-iliac atherosclerosis. No portal venous or mesenteric gas. Significantly limited assessment for adenopathy. Reproductive: Atrophic uterus, normal for age. Adnexa not well assessed. Other: Pelvic floor descent. Moderate abdominopelvic ascites. No evidence for free air, however evaluation significantly limited. Musculoskeletal: Anterolisthesis of L5 on S1 is likely facet mediated. Degenerative disc disease at L3-L4 and L5-S1.  IMPRESSION: 1. Hepatic cirrhosis with moderate ascites. 2. Colonic wall thickening of the ascending colon. Air in stool throughout the colon, some of this air appears circumferential. While this may represent air mixed with stool, in the setting of sepsis and lactic acidosis, colonic pneumatosis is considered and is suspicious for ischemia. Bowel evaluation is technically limited given patient motion, lack of IV contrast, abdominal ascites and paucity of intra-abdominal fat. No evidence of free air. 3. Pelvic floor descent with rectocele. 4. Advanced aorta bi-iliac atherosclerosis. Aortic Atherosclerosis (ICD10-I70.0). 5. Additional chronic findings as described. Critical Value/emergent results were called by telephone at the time of interpretation on 02/25/2019 at 12:01 am to NP [facility] , who verbally acknowledged these results. Electronically Signed   By: Keith Rake M.D.   On: 02/25/2019 00:02   Dg Chest 2  View  Result Date: 03/04/2019 CLINICAL DATA:  Altered mental status EXAM: CHEST - 2 VIEW COMPARISON:  06/21/2018 FINDINGS: Decreased lung volume with mild bibasilar atelectasis. Negative for heart failure or effusion. Negative for pneumonia. IMPRESSION: Hypoventilation with mild bibasilar atelectasis. Electronically Signed   By: Franchot Gallo M.D.   On: 03/10/2019 17:03   Dg Pelvis 1-2 Views  Result Date: 02/22/2019 CLINICAL DATA:  Pain EXAM: PELVIS - 1-2 VIEW COMPARISON:  CT abdomen pelvis 01/22/2018 FINDINGS: Both hips are normal. No pelvic fracture or mass. Moderate to advanced degenerative change in the SI joints bilaterally. Lumbar scoliosis and degenerative change. IMPRESSION: Negative for fracture. Electronically Signed   By: Franchot Gallo M.D.   On: 02/28/2019 17:06   Dg Abd 1 View  Result Date: 02/25/2019 CLINICAL DATA:  NG tube placement. EXAM: ABDOMEN - 1 VIEW COMPARISON:  CT earlier this day. FINDINGS: Tip and side port of the enteric tube below the diaphragm in the stomach.  Air in stool throughout the colon. Question pneumatosis on CT not confirmed on radiograph. Right abdomen not entirely included in the field of view. No evidence of free air. IMPRESSION: Tip and side port of the enteric tube below the diaphragm in the stomach. Electronically Signed   By: Keith Rake M.D.   On: 02/25/2019 02:20   Ct Head Wo Contrast  Result Date: 03/16/2019 CLINICAL DATA:  AMS, no trauma EXAM: CT HEAD WITHOUT CONTRAST TECHNIQUE: Contiguous axial images were obtained from the base of the skull through the vertex without intravenous contrast. COMPARISON:  None. FINDINGS: Brain: Left-sided cochlear implant and related dense metallic streak artifact somewhat limits evaluation of the left hemispheric brain parenchyma. No evidence of acute infarction, hemorrhage, hydrocephalus, extra-axial collection or mass lesion/mass effect. Vascular: No hyperdense vessel or unexpected calcification. Skull: Normal. Negative for fracture or focal lesion. Sinuses/Orbits: No acute finding. Other: None. IMPRESSION: Left-sided cochlear implant and related dense metallic streak artifact somewhat limits evaluation of the left hemispheric brain parenchyma. Within this limitation, no acute intracranial pathology. Electronically Signed   By: Eddie Candle M.D.   On: 03/01/2019 15:54   Ct Femur Right Wo Contrast  Result Date: 03/21/2019 CLINICAL DATA:  Sepsis of unknown origin EXAM: CT OF THE LOWER RIGHT EXTREMITY WITHOUT CONTRAST TECHNIQUE: Multidetector CT imaging of the right lower extremity was performed according to the standard protocol. COMPARISON:  Radiograph 03/24/2019 FINDINGS: Bones/Joint/Cartilage Nondiagnostic images of the distal femur at the level of the knee secondary to patient motion. Imaged portions of the proximal mid and distal femur show no fracture or periostitis. No bony destruction is evident. No significant hip effusion. Ligaments Suboptimally assessed by CT. Muscles and Tendons Mild atrophy of  the hip and thigh muscles. No gross intramuscular fluid collections. Soft tissues Diffuse subcutaneous edema. No focal fluid collections. Ascites within the abdomen and pelvis. IMPRESSION: Motion degraded study despite several imaging attempts. Nondiagnostic evaluation of the distal femur at the level of the prosthesis. Imaged portions of the proximal mid and distal right femur show no acute osseous abnormality. Non-specific edema within the subcutaneous soft tissues without gross focal fluid collection. Electronically Signed   By: Donavan Foil M.D.   On: 03/24/2019 23:50   Dg Femur Min 2 Views Right  Result Date: 03/08/2019 CLINICAL DATA:  Pain EXAM: RIGHT FEMUR 2 VIEWS COMPARISON:  None. FINDINGS: Normal right hip joint. No femur fracture. Right knee replacement satisfactory position alignment without loosening. IMPRESSION: No acute abnormality.  Right knee replacement. Electronically Signed   By:  Franchot Gallo M.D.   On: 03/20/2019 17:05   Ct Maxillofacial Wo Contrast  Result Date: 03/10/2019 CLINICAL DATA:  Right facial swelling. EXAM: CT MAXILLOFACIAL WITHOUT CONTRAST TECHNIQUE: Multidetector CT imaging of the maxillofacial structures was performed. Multiplanar CT image reconstructions were also generated. COMPARISON:  Head CT obtained earlier today. FINDINGS: Osseous: Previously noted left cochlear implant. Reversal of the normal cervical lordosis and multilevel degenerative changes. No acute maxillofacial bony abnormality. The patient is edentulous. Orbits: Unremarkable. Sinuses: Normally aerated. Soft tissues: The right parotid gland is significantly larger than the left parotid gland. On the right, the parotid gland measures 4.2 x 3.1 cm and on the left the parotid gland measures 2.5 x 2.4 cm on image number 49 series 8. There is also adjacent right lateral subcutaneous edema at the level of the parotid gland on the right, extending anteriorly. No parotid stone is visualized. Limited intracranial:  Unremarkable. IMPRESSION: 1. Changes of parotitis on the right with no abscess visible, limited by the lack of intravenous contrast. 2. Cervical spine degenerative changes and reversal of the normal cervical lordosis. Electronically Signed   By: Claudie Revering M.D.   On: 03/14/2019 17:44   US Abdomen Limited Ruq  Result Date: 03/24/2019 CLINICAL DATA:  Right upper quadrant pain x1 day EXAM: ULTRASOUND ABDOMEN LIMITED RIGHT UPPER QUADRANT COMPARISON:  01/22/2018 CT FINDINGS: Limited study as the patient deferred repositioning for better images. Gallbladder: Distended gallbladder without mural thickening however there is a significant amount of biliary sludge and admixed punctate gallstones noted. No secondary signs of acute cholecystitis. Common bile duct: Not visualized. Liver: Coarsened echotexture with morphologic changes of cirrhosis. No discrete mass. Small to moderate volume of ascites is identified outlining the liver. Portal vein is patent on color Doppler imaging with normal direction of blood flow towards the liver. IMPRESSION: 1. Morphologic changes of cirrhosis with ascites. 2. Biliary sludge and tiny calculi are noted within the distended gallbladder without secondary signs of acute cholecystitis. Electronically Signed   By: Ashley Royalty M.D.   On: 03/01/2019 18:08    Medications: Infusions: . metronidazole 500 mg (02/25/19 0309)  . potassium chloride 10 mEq (02/25/19 0917)  .  sodium bicarbonate  infusion 1000 mL 50 mL/hr at 02/25/19 0808  . [START ON 02/26/2019] vancomycin      Scheduled Medications: . ceFEPime (MAXIPIME) IV  500 mg Intravenous Q24H  . lactulose  30 g Per Tube TID  . mouth rinse  15 mL Mouth Rinse BID  . pantoprazole (PROTONIX) IV  40 mg Intravenous Daily  . rifaximin  550 mg Oral BID    have reviewed scheduled and prn medications.  Physical Exam: General:NAD, comfortable Heart:RRR, s1s2 nl, no rubs Lungs:clear b/l, basal decreased breath sound, no  wheezing Abdomen:soft, Non-tender, bowel sounds positive Extremities:No edema Neurology: Alert awake but not really following commands.  Dron Prasad Bhandari 02/25/2019,10:11 AM  LOS: 1 day

## 2019-02-25 NOTE — Progress Notes (Addendum)
PHARMACY - PHYSICIAN COMMUNICATION CRITICAL VALUE ALERT - BLOOD CULTURE IDENTIFICATION (BCID)  [name] is an 73 y.o. female who presented to [facility] on 03/22/2019 with a chief complaint of sepsis.  Assessment:  WBC 14.4, AMS, PCT 62, AKI on CKD IV, LA 7.1, CXR neg, temp 93.6, hemodynamics stable.   Name of physician (or Provider) Contacted: Chesley Mires  Current antibiotics: Vancomycin, metronidazole, and Cefepime  Changes to prescribed antibiotics recommended: BCID with MSSA in 2/4 bottles. Discontinue vancomycin, continue on cefepime. Could consider further de-esclation to cefazolin as appropriate.  Results for orders placed or performed during the hospital encounter of 03/08/2019  Blood Culture ID Panel (Reflexed) (Collected: 03/13/2019  6:22 PM)  Result Value Ref Range   Enterococcus species NOT DETECTED NOT DETECTED   Listeria monocytogenes NOT DETECTED NOT DETECTED   Staphylococcus species DETECTED (A) NOT DETECTED   Staphylococcus aureus (BCID) DETECTED (A) NOT DETECTED   Methicillin resistance NOT DETECTED NOT DETECTED   Streptococcus species NOT DETECTED NOT DETECTED   Streptococcus agalactiae NOT DETECTED NOT DETECTED   Streptococcus pneumoniae NOT DETECTED NOT DETECTED   Streptococcus pyogenes NOT DETECTED NOT DETECTED   Acinetobacter baumannii NOT DETECTED NOT DETECTED   Enterobacteriaceae species NOT DETECTED NOT DETECTED   Enterobacter cloacae complex NOT DETECTED NOT DETECTED   Escherichia coli NOT DETECTED NOT DETECTED   Klebsiella oxytoca NOT DETECTED NOT DETECTED   Klebsiella pneumoniae NOT DETECTED NOT DETECTED   Proteus species NOT DETECTED NOT DETECTED   Serratia marcescens NOT DETECTED NOT DETECTED   Haemophilus influenzae NOT DETECTED NOT DETECTED   Neisseria meningitidis NOT DETECTED NOT DETECTED   Pseudomonas aeruginosa NOT DETECTED NOT DETECTED   Candida albicans NOT DETECTED NOT DETECTED   Candida glabrata NOT DETECTED NOT DETECTED   Candida krusei  NOT DETECTED NOT DETECTED   Candida parapsilosis NOT DETECTED NOT DETECTED   Candida tropicalis NOT DETECTED NOT DETECTED    Thank you for involving pharmacy in this patient's care.  Janae Bridgeman, PharmD PGY1 Pharmacy Resident Phone: [phone] 02/25/2019 11:56 AM

## 2019-02-25 NOTE — Progress Notes (Signed)
CRITICAL VALUE ALERT  Critical Value:  LACTIC ACID - 7.2  Date & Time Notied:  03/13/2019 @ 2320  Provider Notified: Lionel December, NP  Orders Received/Actions taken: Administer 1000 mL bolus of NS

## 2019-02-25 NOTE — Progress Notes (Signed)
PT Cancellation Note  Patient Details Name: [name] MRN: [id number] DOB: 09-14-1946   Cancelled Treatment:      noted pt with discontinued PT orders, acknowledge discontinue and will sign off. Please re-consult if pt needs change.   Reinaldo Berber 02/25/2019, 1:08 PM

## 2019-02-25 NOTE — Significant Event (Addendum)
This is a continuation of 03/20/2019 NP note. As NP was waiting for CT results, the 2nd LA came back a tad higher at 7.2. More IVFs ordered. Procalcitonin is 99. WBCC count up slightly. Creat improving. K up to 3.4. Two additional runs ordered. Awaiting recheck ammonia. Went back to see patient. She is now more lethargic than prior visit to the ED. BP 120s. HR 80s. Rectal temp 94.10F. Ordered [facility]. While on floor, radiologist called with results of CT abd/pelvis which show cirrhosis, ascites and pneumotosis consistent with ischemic bowel. Study wasn't great b/c of movement. NP called Critical care and discussed case. Dr. Gilford Raid will come for consult. Move pt to ICU. Called surgery and am awaiting call back. Pt looks toxic now.  Will call daughter with update.  KJKG, NP Update: ammonia still 88. Since not taking po's will given lactulose enema-actually, she has rectal prolapse and cancelled order. PCCM putting in OG and can be treated via that.  Spoke with general surgeon after he read CT independently and he doubts ischemic bowel and doesn't think, at this time, anything needs to be acutely done.  Pt was taken to ICU and PCCM there to see pt and at this time, NP believes they will accept care of the pt. NP called pt's daughter and updated her on what has happened since NP spoke to her in the ED around 2000hrs. Daughter will come early am or sooner if needs to.  Critical care time. Start again at 0001 End 0120 Total 79 mins.

## 2019-02-25 NOTE — Progress Notes (Signed)
NAME:  [name], MRN:  [id number], DOB:  23-May-1946, LOS: 1 ADMISSION DATE:  03/10/2019, CONSULTATION DATE:  02/25/19 REFERRING MD:  Alfredia Ferguson  CHIEF COMPLAINT:  AMS   Brief History   73 yo female presented with altered mental status and elevated ammonia level and lactic acidosis.  Found to have liver cirrhosis and possible bowel ischemia.  Had progressive lactic acidosis and altered mental status and transferred to ICU.  Past Medical History  HTN, liver disease, hypothyroidism, CKD IV, Anemia.  Significant Hospital Events   3/3 > admit. 3/4 > transfer to ICU.  Consults:  Nephrology. PCCM.  Procedures:    Significant Diagnostic Tests:  CT head 3/3 > left sided cochlear implant. CT MXLFCL 3/3 > changes of parotitis on the right without visible abscess. RUQ [place] 3/3 > cirrhosis with ascites.  Biliary sludge without signs of acute cholecystitis. CT A / P 3/4 > hepatic cirrhosis with moderate ascites, colonic wall thickening of the ascending colon.  Air in stool throughout possibly due to colonic pneumatosis and suspicious for ischemia.  Pelvic floor descent with rectocele. CT right femur 3/4 > neg.  Micro Data:  Blood 3/3 >  Urine 3/3 >   Antimicrobials:  Vanc 3/4 >  Cefepime 3/4 >  Flagyl 3/4 >    Interim history/subjective:  Answers okay to all questions.  Objective:  Blood pressure 101/75, pulse 88, temperature (!) 93.6 F (34.2 C), temperature source Axillary, resp. rate (!) 23, height $RemoveBe'5\' 1"'pqAKqjLpG$  (1.549 m), weight 48.7 kg, SpO2 96 %.    FiO2 (%):  [55 %] 55 %   Intake/Output Summary (Last 24 hours) at 02/25/2019 0721 Last data filed at 02/25/2019 0600 Gross per 24 hour  Intake 420 ml  Output 250 ml  Net 170 ml   Filed Weights   03/11/2019 1456  Weight: 48.7 kg    Examination:  General - somnolent Eyes - pupils reactive ENT - no sinus tenderness, no stridor Cardiac - regular rate/rhythm, no murmur Chest - decreased BS, scattered rhonchi, no wheeze Abdomen - soft,  non tender, mild distention, decreased bowel sounds, no rebound/guarding Extremities - decreased muscle bulk, 1+ edema Skin - no rashes Neuro - moves extremities, confused  Assessment & Plan:   Acute hepatic encephalopathy in setting of sepsis. Cirrhosis with ascites. Plan - previously followed by Dr. Collene Mares with GI - f/u hepatitis panel from 3/04 - continue lactulose - add rifaxamin - f/u LFT, ammonia - monitor mental status  Severe sepsis. Discussion: Possible sources include SBP, parotitis, and urine. Plan - continue ABx - f/u culture results  Acute hypoxic respiratory failure. Discussion: Likely from hypoventilation and atelectasis.  Maintaining airway at present. Plan - oxygen to keep SpO2 90 to 95% - monitor need for intubation - don't think she would be able to tolerate Bipap in current condition - f/u CXR, ABG  CKD 4. Lactic acidosis. Hypokalemia, hypomagnesemia. Plan - f/u BMET, ABG, Lactic acid monitor urine outpt - continue HCO3 in IV fluid  Anemia of critical illness, iron deficiency and chronic disease. Thrombocytopenia, most likely from sepsis and cirrhosis. Plan - f/u CBC - transfuse for Hb < 7, Plt < 10K, or bleeding - f/u [id number] from 3/04  Hx of hypothyroidism. Plan - continue synthroid  Hypoglycemia. Plan - add dextrose to IV fluids  Hx HTN. Plan - Hold preadmission HCTZ, amlodipine.   Best Practice:  Diet: NPO. DVT prophylaxis: SCD's. GI prophylaxis: PPI. Mobility: Bedrest. Code Status: Full. Family Communication: updated daughter at bedside.  Disposition: ICU.  Labs    CMP Latest Ref Rng & Units 02/25/2019 02/25/2019 02/25/2019  Glucose 70 - 99 mg/dL - 77 -  BUN 8 - 23 mg/dL - 41(H) -  Creatinine 0.44 - 1.00 mg/dL - 2.60(H) -  Sodium 135 - 145 mmol/L 142 140 140  Potassium 3.5 - 5.1 mmol/L 3.2(L) 3.1(L) 3.4(L)  Chloride 98 - 111 mmol/L - 111 -  CO2 22 - 32 mmol/L - 14(L) -  Calcium 8.9 - 10.3 mg/dL - 7.0(L) -  Total Protein  6.5 - 8.1 g/dL - - -  Total Bilirubin 0.3 - 1.2 mg/dL - - -  Alkaline Phos 38 - 126 U/L - - -  AST 15 - 41 U/L - - -  ALT 0 - 44 U/L - - -   CBC Latest Ref Rng & Units 02/25/2019 02/25/2019 02/25/2019  WBC 4.0 - 10.5 K/uL - 16.5(H) -  Hemoglobin 12.0 - 15.0 g/dL 8.5(L) 7.2(L) 8.2(L)  Hematocrit 36.0 - 46.0 % 25.0(L) 21.9(L) 24.0(L)  Platelets 150 - 400 K/uL - 60(L) -   Lab Results  Component Value Date   INR 1.9 (H) 02/25/2019   INR 2.9 (H) 01/09/2009   INR 1.5 01/08/2009   ABG    Component Value Date/Time   PHART 7.253 (L) 02/25/2019 0309   PCO2ART 32.8 02/25/2019 0309   PO2ART 54.0 (L) 02/25/2019 0309   HCO3 15.1 (L) 02/25/2019 0309   TCO2 16 (L) 02/25/2019 0309   ACIDBASEDEF 12.0 (H) 02/25/2019 0309   O2SAT 89.0 02/25/2019 0309   CBG (last 3)  Recent Labs    03/15/2019 1553 02/25/19 0208 02/25/19 0416  GLUCAP 82 75 76    CC time 38 minutes  Chesley Mires, MD [facility] Pulmonary/Critical Care 02/25/2019, 7:52 AM

## 2019-02-25 NOTE — Consult Note (Signed)
NAME:  [name], MRN:  [id number], DOB:  06-28-1946, LOS: 1 ADMISSION DATE:  03/24/2019, CONSULTATION DATE:  02/25/19 REFERRING MD:  Alfredia Ferguson  CHIEF COMPLAINT:  AMS   Brief History   [name] is a 72 y.o. female who was admitted 3/3 with acute encephalopathy felt to be due to hyperammonemia.  Later found to have sepsis, likely due to SBP. Transferred to ICU early AM 3/4.  History of present illness   Pt is encephelopathic; therefore, this HPI is obtained from chart review. [name] is a 73 y.o. female who has a PMH as outlined below including but not limited to HTN, CKD IV, hypothyroidism, anemia (see "past medical history").  She presented to [facility] ED 3/3 with confusion that began earlier that morning.    She was found to have hyperammonemia with AGMA with elevated lactate.  She had RUQ [place] that suggested cirrhosis.  This was news to the family as they had never been told about this before.  Pt has no hx of EtOH   She was admitted by [facility] and was evaluated by nephrology who did not feel that her encephalopathy was in large part due to uremia, but rather from her hyperammonemia.  Lactulose was recommended.  Later that evening, she had a lactate of 7.1.  She received 1L bolus and repeat was 7.2  Pt became more somnolent and per TRH and RN on the floor, she appeared more toxic.  CT of the abdomen and pelvis was obtained and demonstrated hepatic cirrhosis with moderate ascites and colonic pneumatosis suspicious for ischemia.  General surgery was consulted and was not overly concerned for ischemia.  Supportive care was recommended.  She was subsequently transferred to the ICU and PCCM was called in consultation.  Past Medical History  HTN, liver disease, hypothyroidism, CKD.  Significant Hospital Events   3/3 > admit. 3/4 > transfer to ICU.  Consults:  Nephrology. PCCM.  Procedures:  None.  Significant Diagnostic Tests:  CT head 3/3 > left sided cochlear implant. CT MXLFCL 3/3 >  changes of parotitis on the right without visible abscess. RUQ [place] 3/3 > cirrhosis with ascites.  Biliary sludge without signs of acute cholecystitis. CT A / P 3/4 > hepatic cirrhosis with moderate ascites, colonic wall thickening of the ascending colon.  Air in stool throughout possibly due to colonic pneumatosis and suspicious for ischemia.  Pelvic floor descent with rectocele. CT right femur 3/4 > neg.  Micro Data:  Blood 3/3 >  Urine 3/3 >   Antimicrobials:  Vanc 3/4 >  Cefepime 3/4 >  Flagyl 3/4 >    Interim history/subjective:  Awake but not following commands (extremely hard of hearing). Moving all extremities.  Objective:  Blood pressure 106/81, pulse 92, temperature 97.9 F (36.6 C), resp. rate (!) 22, height $RemoveBe'5\' 1"'aNCtlPWHc$  (1.549 m), weight 48.7 kg, SpO2 96 %.        Intake/Output Summary (Last 24 hours) at 02/25/2019 0049 Last data filed at 03/18/2019 1817 Gross per 24 hour  Intake 0 ml  Output -  Net 0 ml   Filed Weights   02/23/2019 1456  Weight: 48.7 kg    Examination: General: Adult female, in NAD. Neuro: Eyes open, tracking appropriately.  Moves all extremities. HEENT: [place]/AT. Arcus senilis with cataracts bilaterally.  EOMI.  MM dry. Cardiovascular: RRR, no M/R/G.  Lungs: Respirations even and unlabored.  CTA bilaterally, No W/R/R.  Abdomen: Distended abdomen.  BS hypoactive.  No significant fluid wave. Musculoskeletal: No gross  deformities, no edema.  Skin: Intact, warm, no rashes.  Assessment & Plan:   Acute encephalopathy - likely due to hyperammonemia + sepsis likely due to  intraabdominal process (? SBP).  Surgery consulted but does not feel that pt has ischemia, recommends supportive care. - Continue lactulose, will need NG tube placed. - Surgery consulted by [facility]. - Continue abx and follow cultures.  - Assess cortisol.  Cirrhosis with ascites - unclear etiology.  No EtOH hx.  - Assess hepatitis panel. - Consider autoimmune workup. - Defer paracentesis  tonight as no significant fluid wave and pt already received abx. - Assess coags to further evaluate MELD and discriminant function.  ? TTP vs DIC - shistocytes noted on smear along with increased LDH, anemia, thrombocytopenia, AoCKD, AMS. - Add haptoglobin and fibrinogen. - D/c heparin. - Consider hematology consult in AM.  CKD IV. - Nephrology following, appreciate the assistance.  AGMA - due to lactate.  Lactate clearance decreased given cirrhosis / hepatic disease. - Continue fluids, change from NS to LR at 100/hr. - Continue bicarb PO (per tube for now), drip d/c'd by nephrology. - Agree hold HCO3 for now.  Hypokalemia - s/p repletion. - Follow BMP. - Continue to hold preadmission HCTZ.  Hyponatremia - likely hypovolemic. - Continue fluids. - Follow BMP.  Hx hypothyroidism. - Continue synthroid. - F/u on TSH, T4.  Anemia of chronic disease. - Transfuse for Hgb < 7. - F/u on anemia panel.  Hx HTN. - Hold preadmission HCTZ, amlodipine.   Best Practice:  Diet: NPO. Pain/Anxiety/Delirium protocol (if indicated): N/A. VAP protocol (if indicated): N/A. DVT prophylaxis: SCD's. GI prophylaxis: PPI. Glucose control: N/A. Mobility: Bedrest. Code Status: Full. Family Communication: Daughter updated by [facility] prior to ICU transfer. Disposition: ICU.  Labs   CBC: Recent Labs  Lab 03/05/2019 1512 03/03/2019 2019  WBC 14.4* 15.3*  NEUTROABS 11.4* 11.5*  HGB 8.4* 8.1*  HCT 25.8* 24.0*  MCV 129.0* 129.0*  PLT PLATELET CLUMPS NOTED ON SMEAR, COUNT APPEARS DECREASED 53*   Basic Metabolic Panel: Recent Labs  Lab 03/22/2019 1512 03/17/2019 1726 03/09/2019 2019  NA 136  --  131*  K 2.6*  --  3.4*  CL 103  --  102  CO2 15*  --  14*  GLUCOSE 120*  --  79  BUN 50*  --  46*  CREATININE 3.37*  --  3.11*  CALCIUM 8.3*  --  7.8*  MG  --  1.8  --   PHOS  --  5.1*  --    GFR: Estimated Creatinine Clearance: 12 mL/min (A) (by C-G formula based on SCr of 3.11 mg/dL (H)). Recent  Labs  Lab 03/09/2019 1512 02/22/2019 1824 03/06/2019 2019 03/23/2019 2224  PROCALCITON  --   --  99.43  --   WBC 14.4*  --  15.3*  --   LATICACIDVEN  --  7.1*  --  7.2*   Liver Function Tests: Recent Labs  Lab 03/02/2019 1512 03/06/2019 2019  AST 64* 68*  ALT 35 35  ALKPHOS 114 112  BILITOT 3.0* 3.2*  PROT 5.5* 5.2*  ALBUMIN 2.9* 2.8*   No results for input(s): LIPASE, AMYLASE in the last 168 hours. Recent Labs  Lab 03/11/2019 1512 03/21/2019 2327  AMMONIA 81* 88*   ABG No results found for: PHART, PCO2ART, PO2ART, HCO3, TCO2, ACIDBASEDEF, O2SAT  Coagulation Profile: No results for input(s): INR, PROTIME in the last 168 hours. Cardiac Enzymes: Recent Labs  Lab 03/09/2019 1512  TROPONINI 0.04*  HbA1C: No results found for: HGBA1C CBG: Recent Labs  Lab 03/09/2019 1457 03/05/2019 1553  GLUCAP 157* 82    Review of Systems:   Unable to obtain as pt is encephalopathic.  Past medical history  She,  has a past medical history of Hypertension, Liver disease, Renal disorder, and Thyroid disease.   Surgical History    Past Surgical History:  Procedure Laterality Date  . COCHLEAR IMPLANT    . KNEE SURGERY       Social History   reports that she has quit smoking. She has never used smokeless tobacco. She reports that she does not drink alcohol or use drugs.   Family history   Her family history includes Aneurysm in her father; Cervical cancer in her mother; Hypertension in an other family member.   Allergies No Known Allergies   Home meds  Prior to Admission medications   Medication Sig Start Date End Date Taking? Authorizing Provider  amLODipine (NORVASC) 5 MG tablet TAKE 1 TABLET(5 MG) BY MOUTH DAILY Patient taking differently: Take 5 mg by mouth daily.  11/19/18  Yes Minette Brine, FNP  hydrochlorothiazide (HYDRODIURIL) 12.5 MG tablet Take 12.5 mg by mouth daily.   Yes [provider]  levothyroxine (SYNTHROID, LEVOTHROID) 75 MCG tablet TAKE 1/2 TABLET  DAILY Patient taking differently: Take 37.5 mcg by mouth daily.  01/26/19  Yes Minette Brine, FNP  sodium bicarbonate 650 MG tablet Take 1 tablet (650 mg total) by mouth 3 (three) times daily. Patient taking differently: Take 650 mg by mouth 2 (two) times daily.  04/03/13  Yes Dhungel, Nishant, MD    Critical care time: 40 min.    Montey Hora, [place] [facility] Pager: [phone].  If no answer, [phone] 02/25/2019, 12:49 AM

## 2019-02-25 NOTE — Evaluation (Signed)
Clinical/Bedside Swallow Evaluation Patient Details  Name: [name] MRN: [id number] Date of Birth: April 21, 1946  Today's Date: 02/25/2019 Time: SLP Start Time (ACUTE ONLY): 0845 SLP Stop Time (ACUTE ONLY): 0905 SLP Time Calculation (min) (ACUTE ONLY): 20 min  Past Medical History:  Past Medical History:  Diagnosis Date  . Hypertension   . Liver disease   . Renal disorder   . Thyroid disease    Past Surgical History:  Past Surgical History:  Procedure Laterality Date  . COCHLEAR IMPLANT    . KNEE SURGERY     HPI:  Pt is a 73 y.o. female with PMH of hypertension, questionable liver disease, chronic kidney disease stage IV, hypothyroidism, anemia chronic kidney disease, as well as other comorbidities who presents with a chief complaint of confusion. Pt found to have hyperammonemia with AGMA with elevated lactate.  She had RUQ [place] that suggested cirrhosis.   Assessment / Plan / Recommendation Clinical Impression  Pt, presenting with decreased mentation secondary to hyperammonemia, seen for bedside swallow evaluation with daughter present who provided medical hx. Pt unable to follow 1- step directions to complete oral mechanism examination despite max verbal/visual cues and displayed poor awareness and attention to PO intake requiring max tactile and verbal cues to complete all intake. Ice chips yielded no s/sx of aspiration, but small cup/straw sips resulted in immediate coughing with wet vocal quality. Puree tolerated with no observable signs of dysphagia, but pt did not accept after 2 bites. POs administered through venti-mask as pt desats quickly. Suspect large bore NG tube impacting swallow function leading to possible aspiration. Recommend NPO with ice chips intermittently throughout the day following oral care and suggest replacement of NG tube with Cortrak to decrease impedence of swallow. Diet advacement indicated with increased attention/awareness to PO intake and stable respiratory  status.  SLP Visit Diagnosis: Dysphagia, unspecified (R13.10)    Aspiration Risk  Moderate aspiration risk    Diet Recommendation NPO(ice chips intermittently)   Medication Administration: Via alternative means Supervision: Staff to assist with self feeding;Full supervision/cueing for compensatory strategies Compensations: Slow rate;Minimize environmental distractions Postural Changes: Seated upright at 90 degrees    Other  Recommendations Oral Care Recommendations: Oral care QID   Follow up Recommendations (TBD)      Frequency and Duration min 2x/week          Prognosis Prognosis for Safe Diet Advancement: Good Barriers to Reach Goals: Cognitive deficits      Swallow Study   General HPI: Pt is a 73 y.o. female with PMH of hypertension, questionable liver disease, chronic kidney disease stage IV, hypothyroidism, anemia chronic kidney disease, as well as other comorbidities who presents with a chief complaint of confusion. Pt found to have hyperammonemia with AGMA with elevated lactate.  She had RUQ [place] that suggested cirrhosis. Type of Study: Bedside Swallow Evaluation Diet Prior to this Study: NPO Temperature Spikes Noted: No Respiratory Status: Venti-mask History of Recent Intubation: No Behavior/Cognition: Confused;Agitated;Uncooperative;Requires cueing;Doesn't follow directions Oral Cavity Assessment: (unable to assess ) Oral Care Completed by SLP: No Oral Cavity - Dentition: Dentures, bottom Self-Feeding Abilities: Total assist;Refused PO Patient Positioning: Upright in bed Baseline Vocal Quality: Normal Volitional Cough: Weak Volitional Swallow: Able to elicit    Oral/Motor/Sensory Function Overall Oral Motor/Sensory Function: Within functional limits   Ice Chips Ice chips: Within functional limits Presentation: Spoon   Thin Liquid Thin Liquid: Impaired Presentation: Cup;Straw Pharyngeal  Phase Impairments: Cough - Immediate    Nectar Thick Nectar Thick  Liquid: Not  tested   Honey Thick Honey Thick Liquid: Not tested   Puree Puree: Within functional limits Presentation: Spoon   Solid     Solid: Not tested      Ellis Savage, Student SLP 02/25/2019,9:40 AM

## 2019-02-25 NOTE — Consult Note (Signed)
[facility] for Infectious Disease    Date of Admission:  03/18/2019          Reason for Consult: MSSA bacteremia  Referring Provider: CHAMP / [employer] Primary Care Provider: Glendale Chard, MD   Assessment/Plan:  [name] is a 73 y/o female admitted with altered mental status and found to have MSSA bactermia and encephalopathy likely of hepatic origin with ammonia level of 81 and [place] and CT showing cirrhosis. Source of infection is unclear at present. CT of the abdomen without masses or lesions. Will obtain TTE to rule out endocarditis and consider TEE. Will repeat blood cultures to ensure clearance of bacteremia. Antibiotics narrowed to Ancef renally dosed per pharmacy. [name] has multiple medical problems all likely contributing to her current presentation.   MSSA bacteremia - Narrow to Ancef. Recheck blood cultures. TTE ordered to rule out endocarditis.   Encephalopathy - likely hepatic with cirrhosis and elevated ammonia levels. Continue current lactulose and management per CCM.  Thrombocytopenia - Hematology following.  CKD Stage IV - Nephrology evaluated and unlikely source of encephalopathy. Creatinine stable.          Antimicrobial Management Team Staphylococcus aureus bacteremia   Staphylococcus aureus bacteremia (SAB) is associated with a high rate of complications and mortality.  Specific aspects of clinical management are critical to optimizing the outcome of patients with SAB.  Therefore, the [facility] Antimicrobial Management Team [facility]) has initiated an intervention aimed at improving the management of SAB at [name].  To do so, Infectious Diseases physicians are providing an evidence-based consult for the management of all patients with SAB.     Yes No Comments  Perform follow-up blood cultures (even if the patient is afebrile) to ensure clearance of bacteremia [x]  []  Ordered for 3/5  Remove vascular catheter and obtain follow-up blood  cultures after the removal of the catheter []  [x]    Perform echocardiography to evaluate for endocarditis (transthoracic ECHO is 40-50% sensitive, TEE is > 90% sensitive) [x]  []  Please keep in mind, that neither test can definitively EXCLUDE endocarditis, and that should clinical suspicion remain high for endocarditis the patient should then still be treated with an "endocarditis" duration of therapy = 6 weeks  Consult electrophysiologist to evaluate implanted cardiac device (pacemaker, ICD) []  [x]    Ensure source control [x]  []  Have all abscesses been drained effectively? Have deep seeded infections (septic joints or osteomyelitis) had appropriate surgical debridement?  Investigate for "metastatic" sites of infection [x]  []  Does the patient have ANY symptom or physical exam finding that would suggest a deeper infection (back or neck pain that may be suggestive of vertebral osteomyelitis or epidural abscess, muscle pain that could be a symptom of pyomyositis)?  Keep in mind that for deep seeded infections MRI imaging with contrast is preferred rather than other often insensitive tests such as plain x-rays, especially early in a patient's presentation.  Change antibiotic therapy to _________Ancef___ [x]  []  Beta-lactam antibiotics are preferred for MSSA due to higher cure rates.   If on Vancomycin, goal trough should be 15 - 20 mcg/mL  Estimated duration of IV antibiotic therapy:   Pending - likely 4-6 weeks  [x]  []  Consult case management for probably prolonged outpatient IV antibiotic therapy    Principal Problem:   MSSA bacteremia Active Problems:   CKD (chronic kidney disease) stage 4, GFR 15-29 ml/min (HCC)   Encephalopathy acute   Cirrhosis ([place])   Hypothyroidism   AKI (acute kidney injury) ([place])  Protein-calorie malnutrition, severe   Essential hypertension   Hyperammonemia (HCC)   Hypokalemia   Increased anion gap metabolic acidosis   Acute kidney injury superimposed on chronic  kidney disease (HCC)   Leukocytosis   Elevated AST (SGOT)   Macrocytic anemia   Sepsis ([place])   . Chlorhexidine Gluconate Cloth  6 each Topical Q0600  . lactulose  30 g Per Tube TID  . mouth rinse  15 mL Mouth Rinse BID  . pantoprazole (PROTONIX) IV  40 mg Intravenous Daily  . rifaximin  550 mg Oral BID     HPI: [name] is a 73 y.o. female with previous medical history of hypertension, cirrhosis, CDK Stage IV, cochlear implantsand hypothyroidism admitted with acute onset altered mental status.  Primary information was obtained from [name]'s daughter as [name] was unable to provide history at time of visit. [name] was in her usual state of health until about 2 days ago when family noted that she had a change in her mental status. She was brought to the ED for further evaluation. To the best of her daughter's knowledge she has not been sick recently or had any fevers, chills, or sweats. She has had "cramps" in her right thigh that would wax and wane. Of note this was the same leg she had a total knee replacement about 8 years ago and has had no problems with. EMS noted her to have a blood sugar of 44 and was given D10 en route.   In the ED [name] was afebrile with a WBC count of 14.4. Other significant lab work with hypokalemia of 2.6, ammonia 81, AST 64, ALT 35, Bilirubin 3.0, Creatinine 3.37 (baseline 2.5-3.0 and followed by nephrology), and platelet clumping noted on smear with later result of thrombocytopenia of 53.   Imaging results as follows: CT Head >> no acute intracranial abnormaility CT Maxillofacial >> Parotitis on the right without abscess CT Femur >> Motion degraded with no acute osseus findings X-ray Chest >> No evidence of effusion or pneumonia X-ray Pelvis >> No fracture [place] RUQ >> morphological changes of cirrhosis with acites and no evidence of acute cholecysitis CT Abd/Pelvis >>  hepatic cirhossis with moderate ascites and colonic wall thickening and air  in stool with concern for colonic pneumatosis and suspicion for bowel ischema.  There was no free air.   Nephrology with no evidence of kidney involvement in her current encephalopathy. Hematology with thrombocytopenia with differentials including sepsis, DIC, TTP or ITP. Awaiting additional blood work for further recommendations.   Initial blood cultures drawn were positive for MSSA infection in 2/4 bottles. Started on Vancomycin, Cefepime and metronidazole prior to blood culture identification of MSSA. Now on Cefepime. [name] has remained afebrile since admission with slowly increasing leukocytosis. She has no active bleeding at present with most recent hemoglobin dropping. She does have a contusion on her right inner thigh that may need further investigation for bleeding.   Review of Systems: Review of Systems  Unable to perform ROS: Acuity of condition     Past Medical History:  Diagnosis Date  . Anemia    Has received Aranesp in the past  . Hypertension   . Liver disease    Family reports eval at [facility] by Hepatology >10 years ago. Per family, was told that she did not have cirrhosis.   . Renal disorder   . Thyroid disease     Social History   Tobacco Use  . Smoking status: Former Smoker  Packs/day: 0.50    Years: 30.00    Pack years: 15.00    Types: Cigarettes    Last attempt to quit: 12/24/2010    Years since quitting: 8.1  . Smokeless tobacco: Never Used  . Tobacco comment: Quit date estimated. Smoked for at least 30 years.   Substance Use Topics  . Alcohol use: Never    Frequency: Never  . Drug use: No    Family History  Problem Relation Age of Onset  . Hypertension Other   . Cervical cancer Mother   . Aneurysm Father   . Emphysema Sister     No Known Allergies  OBJECTIVE: Blood pressure 92/63, pulse 86, temperature (!) 97.3 F (36.3 C), temperature source Oral, resp. rate (!) 21, height $RemoveBe'5\' 1"'MMnkTILah$  (1.549 m), weight 48.7 kg, SpO2 97 %.  Physical  Exam Constitutional:      General: She is not in acute distress.    Appearance: She is well-developed. She is ill-appearing.     Interventions: She is restrained. Face mask in place.  Cardiovascular:     Rate and Rhythm: Normal rate and regular rhythm.     Heart sounds: Normal heart sounds. No murmur. No friction rub. No gallop.   Pulmonary:     Effort: Pulmonary effort is normal.     Breath sounds: Normal breath sounds. No wheezing, rhonchi or rales.  Abdominal:     General: Bowel sounds are normal. There is distension.     Palpations: There is no mass.     Tenderness: There is no abdominal tenderness. There is no guarding or rebound.  Skin:    General: Skin is warm and dry.     Findings: No rash.  Neurological:     Mental Status: She is lethargic and disoriented.     Lab Results Lab Results  Component Value Date   WBC 16.5 (H) 02/25/2019   HGB 8.5 (L) 02/25/2019   HCT 25.0 (L) 02/25/2019   MCV 126.6 (H) 02/25/2019   PLT 60 (L) 02/25/2019    Lab Results  Component Value Date   CREATININE 2.60 (H) 02/25/2019   BUN 41 (H) 02/25/2019   NA 142 02/25/2019   K 3.2 (L) 02/25/2019   CL 111 02/25/2019   CO2 14 (L) 02/25/2019    Lab Results  Component Value Date   ALT 35 03/09/2019   AST 68 (H) 03/17/2019   ALKPHOS 112 03/02/2019   BILITOT 2.9 (H) 02/25/2019     Microbiology: Recent Results (from the past 240 hour(s))  Urine culture     Status: Abnormal (Preliminary result)   Collection Time: 03/18/2019  5:04 PM  Result Value Ref Range Status   Specimen Description URINE, RANDOM  Final   Special Requests   Final    NONE Performed at [facility], [place] [address] [place], [place] [address]    Culture >=100,000 COLONIES/mL ESCHERICHIA COLI (A)  Final   Report Status PENDING  Incomplete  Blood culture (routine x 2)     Status: None (Preliminary result)   Collection Time: 03/17/2019  6:20 PM  Result Value Ref Range Status   Specimen Description BLOOD LEFT ARM   Final   Special Requests   Final    BOTTLES DRAWN AEROBIC ONLY Blood Culture adequate volume   Culture  Setup Time   Final    GRAM POSITIVE COCCI IN CLUSTERS AEROBIC BOTTLE ONLY CRITICAL VALUE NOTED.  VALUE IS CONSISTENT WITH PREVIOUSLY REPORTED AND CALLED VALUE. Performed at  [facility], [place] [address], [place], [place] [address]    Culture GRAM POSITIVE COCCI  Final   Report Status PENDING  Incomplete  Blood culture (routine x 2)     Status: None (Preliminary result)   Collection Time: 03/23/2019  6:22 PM  Result Value Ref Range Status   Specimen Description BLOOD RIGHT ANTECUBITAL  Final   Special Requests   Final    BOTTLES DRAWN AEROBIC AND ANAEROBIC Blood Culture results may not be optimal due to an inadequate volume of blood received in culture bottles   Culture  Setup Time   Final    GRAM POSITIVE COCCI IN CLUSTERS AEROBIC BOTTLE ONLY Organism ID to follow CRITICAL RESULT CALLED TO, READ BACK BY AND VERIFIED WITH: PHRMD D HANNA $Remov'@1142'sXhRok$  02/25/2019 BY S GEZAHEGN Performed at [facility], [place] [address], [place], [place] [address]    Culture GRAM POSITIVE COCCI  Final   Report Status PENDING  Incomplete  Blood Culture ID Panel (Reflexed)     Status: Abnormal   Collection Time: 03/17/2019  6:22 PM  Result Value Ref Range Status   Enterococcus species NOT DETECTED NOT DETECTED Final   Listeria monocytogenes NOT DETECTED NOT DETECTED Final   Staphylococcus species DETECTED (A) NOT DETECTED Final    Comment: CRITICAL RESULT CALLED TO, READ BACK BY AND VERIFIED WITH: PHRMD D HANNA $Remov'@1142'GdPGcD$  02/25/2019 BY S GEZAHEGN    Staphylococcus aureus (BCID) DETECTED (A) NOT DETECTED Final    Comment: Methicillin (oxacillin) susceptible Staphylococcus aureus (MSSA). Preferred therapy is anti staphylococcal beta lactam antibiotic (Cefazolin or Nafcillin), unless clinically contraindicated. CRITICAL RESULT CALLED TO, READ BACK BY AND VERIFIED WITH: PHRMD D HANNA $Remov'@1142'uoEhNr$  02/25/2019 BY S GEZAHEGN     Methicillin resistance NOT DETECTED NOT DETECTED Final   Streptococcus species NOT DETECTED NOT DETECTED Final   Streptococcus agalactiae NOT DETECTED NOT DETECTED Final   Streptococcus pneumoniae NOT DETECTED NOT DETECTED Final   Streptococcus pyogenes NOT DETECTED NOT DETECTED Final   Acinetobacter baumannii NOT DETECTED NOT DETECTED Final   Enterobacteriaceae species NOT DETECTED NOT DETECTED Final   Enterobacter cloacae complex NOT DETECTED NOT DETECTED Final   Escherichia coli NOT DETECTED NOT DETECTED Final   Klebsiella oxytoca NOT DETECTED NOT DETECTED Final   Klebsiella pneumoniae NOT DETECTED NOT DETECTED Final   Proteus species NOT DETECTED NOT DETECTED Final   Serratia marcescens NOT DETECTED NOT DETECTED Final   Haemophilus influenzae NOT DETECTED NOT DETECTED Final   Neisseria meningitidis NOT DETECTED NOT DETECTED Final   Pseudomonas aeruginosa NOT DETECTED NOT DETECTED Final   Candida albicans NOT DETECTED NOT DETECTED Final   Candida glabrata NOT DETECTED NOT DETECTED Final   Candida krusei NOT DETECTED NOT DETECTED Final   Candida parapsilosis NOT DETECTED NOT DETECTED Final   Candida tropicalis NOT DETECTED NOT DETECTED Final    Comment: Performed at [facility], [place]. [address] [place], [place] [address]  MRSA PCR Screening     Status: None   Collection Time: 02/25/19 12:58 AM  Result Value Ref Range Status   MRSA by PCR NEGATIVE NEGATIVE Final    Comment:        The GeneXpert MRSA Assay (FDA approved for NASAL specimens only), is one component of a comprehensive MRSA colonization surveillance program. It is not intended to diagnose MRSA infection nor to guide or monitor treatment for MRSA infections. Performed at [facility], [place] [address], [place], [place] [address]  Terri Piedra, NP [facility] [place] [facility] [phone] Pager  02/25/2019  1:45 PM

## 2019-02-25 NOTE — Progress Notes (Signed)
OT Cancellation Note  Patient Details Name: [name] MRN: [id number] DOB: Feb 01, 1946   Cancelled Treatment:    Reason Eval/Treat Not Completed: Other (comment); noted pt with discontinued OT orders, acknowledge discontinue and will sign off. Please re-consult if pt needs change.  Lou Cal, OT Supplemental Rehabilitation Services Pager [phone] Office [phone]   Raymondo Band 02/25/2019, 8:04 AM

## 2019-02-25 NOTE — Progress Notes (Signed)
SLP Cancellation Note  Patient Details Name: Teren Franckowiak MRN: [id number] DOB: 10/24/1946   Cancelled treatment:       Reason Eval/Treat Not Completed: Other (comment). SLP orders discharged by MD, eval completed this am. If reassessment or treatment needed please reorder.   Herbie [place], MA CCC-SLP  Acute Rehabilitation Services Pager [phone] Office [phone]  Lynann Beaver 02/25/2019, 10:26 AM

## 2019-02-26 ENCOUNTER — Inpatient Hospital Stay (HOSPITAL_COMMUNITY): Payer: Medicare Other

## 2019-02-26 DIAGNOSIS — R451 Restlessness and agitation: Secondary | ICD-10-CM

## 2019-02-26 DIAGNOSIS — R7989 Other specified abnormal findings of blood chemistry: Secondary | ICD-10-CM

## 2019-02-26 DIAGNOSIS — R4182 Altered mental status, unspecified: Secondary | ICD-10-CM

## 2019-02-26 DIAGNOSIS — I361 Nonrheumatic tricuspid (valve) insufficiency: Secondary | ICD-10-CM

## 2019-02-26 LAB — BLOOD GAS, ARTERIAL
Acid-base deficit: 3.3 mmol/L — ABNORMAL HIGH (ref 0.0–2.0)
Bicarbonate: 20.6 mmol/L (ref 20.0–28.0)
Drawn by: 347621
FIO2: 0.55
O2 Saturation: 95.9 %
Patient temperature: 98.1
pCO2 arterial: 33 mmHg (ref 32.0–48.0)
pH, Arterial: 7.411 (ref 7.350–7.450)
pO2, Arterial: 80.8 mmHg — ABNORMAL LOW (ref 83.0–108.0)

## 2019-02-26 LAB — GLUCOSE, CAPILLARY
GLUCOSE-CAPILLARY: 105 mg/dL — AB (ref 70–99)
Glucose-Capillary: 126 mg/dL — ABNORMAL HIGH (ref 70–99)
Glucose-Capillary: 128 mg/dL — ABNORMAL HIGH (ref 70–99)
Glucose-Capillary: 150 mg/dL — ABNORMAL HIGH (ref 70–99)
Glucose-Capillary: 160 mg/dL — ABNORMAL HIGH (ref 70–99)
Glucose-Capillary: 163 mg/dL — ABNORMAL HIGH (ref 70–99)
Glucose-Capillary: 87 mg/dL (ref 70–99)

## 2019-02-26 LAB — CBC
HCT: 22.5 % — ABNORMAL LOW (ref 36.0–46.0)
Hemoglobin: 7.9 g/dL — ABNORMAL LOW (ref 12.0–15.0)
MCH: 43.9 pg — AB (ref 26.0–34.0)
MCHC: 35.1 g/dL (ref 30.0–36.0)
MCV: 125 fL — ABNORMAL HIGH (ref 80.0–100.0)
Platelets: 36 10*3/uL — ABNORMAL LOW (ref 150–400)
RBC: 1.8 MIL/uL — ABNORMAL LOW (ref 3.87–5.11)
RDW: 26.4 % — ABNORMAL HIGH (ref 11.5–15.5)
WBC: 13.5 10*3/uL — ABNORMAL HIGH (ref 4.0–10.5)
nRBC: 1.8 % — ABNORMAL HIGH (ref 0.0–0.2)

## 2019-02-26 LAB — URINE CULTURE: Culture: >=100000 — AB

## 2019-02-26 LAB — COMPREHENSIVE METABOLIC PANEL
ALT: 81 U/L — ABNORMAL HIGH (ref 0–44)
AST: 287 U/L — AB (ref 15–41)
Albumin: 2.4 g/dL — ABNORMAL LOW (ref 3.5–5.0)
Alkaline Phosphatase: 124 U/L (ref 38–126)
Anion gap: 17 — ABNORMAL HIGH (ref 5–15)
BUN: 38 mg/dL — AB (ref 8–23)
CO2: 17 mmol/L — AB (ref 22–32)
Calcium: 7.8 mg/dL — ABNORMAL LOW (ref 8.9–10.3)
Chloride: 107 mmol/L (ref 98–111)
Creatinine, Ser: 2.84 mg/dL — ABNORMAL HIGH (ref 0.44–1.00)
GFR calc Af Amer: 18 mL/min — ABNORMAL LOW (ref >60)
GFR calc non Af Amer: 16 mL/min — ABNORMAL LOW (ref >60)
Glucose, Bld: 93 mg/dL (ref 70–99)
Potassium: 3.3 mmol/L — ABNORMAL LOW (ref 3.5–5.1)
Sodium: 141 mmol/L (ref 135–145)
Total Bilirubin: 2.9 mg/dL — ABNORMAL HIGH (ref 0.3–1.2)
Total Protein: 4.9 g/dL — ABNORMAL LOW (ref 6.5–8.1)

## 2019-02-26 LAB — HEPATITIS PANEL, ACUTE
HCV Ab: <0.1 s/co ratio (ref 0.0–0.9)
Hep A IgM: NEGATIVE
Hep B C IgM: NEGATIVE
Hepatitis B Surface Ag: NEGATIVE

## 2019-02-26 LAB — MAGNESIUM
Magnesium: 2.3 mg/dL (ref 1.7–2.4)
Magnesium: 2.3 mg/dL (ref 1.7–2.4)
Magnesium: 2.3 mg/dL (ref 1.7–2.4)

## 2019-02-26 LAB — PROTIME-INR
INR: 1.8 — ABNORMAL HIGH (ref 0.8–1.2)
Prothrombin Time: 20.6 seconds — ABNORMAL HIGH (ref 11.4–15.2)

## 2019-02-26 LAB — ECHOCARDIOGRAM COMPLETE
Height: 61 in
Weight: 1784.84 oz

## 2019-02-26 LAB — PHOSPHORUS
PHOSPHORUS: 3.7 mg/dL (ref 2.5–4.6)
Phosphorus: 3.4 mg/dL (ref 2.5–4.6)

## 2019-02-26 LAB — LACTIC ACID, PLASMA: Lactic Acid, Venous: 4.8 mmol/L (ref 0.5–1.9)

## 2019-02-26 LAB — PROCALCITONIN: Procalcitonin: 61.97 ng/mL

## 2019-02-26 LAB — HAPTOGLOBIN: Haptoglobin: <10 mg/dL — ABNORMAL LOW (ref 42–346)

## 2019-02-26 LAB — AMMONIA: Ammonia: 50 umol/L — ABNORMAL HIGH (ref 9–35)

## 2019-02-26 MED ORDER — RIFAXIMIN 550 MG PO TABS
550.0000 mg | ORAL_TABLET | Freq: Two times a day (BID) | ORAL | Status: AC
  Administered 2019-02-26 – 2019-03-01 (×7): 550 mg
  Filled 2019-02-26 (×7): qty 1

## 2019-02-26 MED ORDER — ACETAMINOPHEN 650 MG RE SUPP
650.0000 mg | Freq: Four times a day (QID) | RECTAL | Status: DC | PRN
  Administered 2019-03-06: 650 mg via RECTAL
  Filled 2019-02-26: qty 1

## 2019-02-26 MED ORDER — LEVOTHYROXINE SODIUM 75 MCG PO TABS
37.5000 ug | ORAL_TABLET | Freq: Every day | ORAL | Status: DC
  Administered 2019-02-26 – 2019-03-06 (×8): 37.5 ug
  Filled 2019-02-26 (×9): qty 0.5

## 2019-02-26 MED ORDER — POTASSIUM CHLORIDE 20 MEQ/15ML (10%) PO SOLN
40.0000 meq | Freq: Once | ORAL | Status: AC
  Administered 2019-02-26: 40 meq
  Filled 2019-02-26: qty 30

## 2019-02-26 MED ORDER — JEVITY 1.2 CAL PO LIQD
1000.0000 mL | ORAL | Status: DC
  Administered 2019-02-26 – 2019-02-27 (×2): 1000 mL
  Administered 2019-02-28: 04:00:00
  Administered 2019-03-01: 55 mL/h
  Filled 2019-02-26 (×9): qty 1000

## 2019-02-26 MED ORDER — PRO-STAT SUGAR FREE PO LIQD
30.0000 mL | Freq: Two times a day (BID) | ORAL | Status: DC
  Administered 2019-02-26: 30 mL
  Filled 2019-02-26: qty 30

## 2019-02-26 MED ORDER — ACETAMINOPHEN 325 MG PO TABS
650.0000 mg | ORAL_TABLET | Freq: Four times a day (QID) | ORAL | Status: DC | PRN
  Administered 2019-02-27: 650 mg
  Filled 2019-02-26: qty 2

## 2019-02-26 MED ORDER — PANTOPRAZOLE SODIUM 40 MG PO PACK
40.0000 mg | PACK | ORAL | Status: DC
  Administered 2019-02-27 – 2019-03-01 (×3): 40 mg
  Filled 2019-02-26 (×4): qty 20

## 2019-02-26 MED ORDER — DEXTROSE-NACL 5-0.9 % IV SOLN
INTRAVENOUS | Status: DC
  Administered 2019-02-26 – 2019-02-27 (×2): via INTRAVENOUS

## 2019-02-26 MED ORDER — VITAL HIGH PROTEIN PO LIQD: 1000.0000 mL | ORAL | Status: DC

## 2019-02-26 MED ORDER — LEVOTHYROXINE SODIUM 75 MCG PO TABS: 37.5000 ug | ORAL_TABLET | Freq: Every day | ORAL | Status: DC

## 2019-02-26 MED ORDER — LACTULOSE 10 GM/15ML PO SOLN
30.0000 g | Freq: Two times a day (BID) | ORAL | Status: DC
  Administered 2019-02-26 – 2019-03-02 (×8): 30 g
  Filled 2019-02-26 (×8): qty 45

## 2019-02-26 NOTE — Progress Notes (Signed)
  Echocardiogram 2D Echocardiogram has been performed.  [name] 02/26/2019, 11:53 AM

## 2019-02-26 NOTE — Progress Notes (Signed)
Pt's daughter Derl Barrow is requesting Dr. Halford Chessman fill out FMLA forms for her. Dr. Halford Chessman called and made aware of forms. Paperwork placed in the chart.

## 2019-02-26 NOTE — Progress Notes (Signed)
NAME:  [name], MRN:  [id number], DOB:  Oct 27, 1946, LOS: 2 ADMISSION DATE:  03/10/2019, CONSULTATION DATE:  02/25/19 REFERRING MD:  Alfredia Ferguson  CHIEF COMPLAINT:  AMS   Brief History   73 yo female presented with altered mental status and elevated ammonia level and lactic acidosis.  Found to have liver cirrhosis and possible bowel ischemia.  Had progressive lactic acidosis and altered mental status and transferred to ICU.  Past Medical History  HTN, liver disease, hypothyroidism, CKD IV, Anemia.  Significant Hospital Events   3/3 > admit. 3/4 > transfer to ICU.  Consults:  Nephrology ID Hematology  Procedures:  TTE 3/05 >>  Significant Diagnostic Tests:  CT head 3/3 > left sided cochlear implant. CT MXLFCL 3/3 > changes of parotitis on the right without visible abscess. RUQ [place] 3/3 > cirrhosis with ascites.  Biliary sludge without signs of acute cholecystitis. CT A / P 3/4 > hepatic cirrhosis with moderate ascites, colonic wall thickening of the ascending colon.  Air in stool throughout possibly due to colonic pneumatosis and suspicious for ischemia.  Pelvic floor descent with rectocele. CT right femur 3/4 > neg.  Micro Data:  Blood 3/3 > Staph aureus Urine 3/3 > E coli  Antimicrobials:  Vanc 3/4 > 3/4 Cefepime 3/4 > 3/4  Flagyl 3/4 > 3/4 Ancef 3/4 >   Interim history/subjective:  Confused.  Objective:  Blood pressure 119/61, pulse 91, temperature 98.6 F (37 C), temperature source Axillary, resp. rate 15, height $RemoveBe'5\' 1"'KcimqQGoi$  (1.549 m), weight 50.6 kg, SpO2 97 %.    FiO2 (%):  [55 %] 55 %   Intake/Output Summary (Last 24 hours) at 02/26/2019 1038 Last data filed at 02/26/2019 1000 Gross per 24 hour  Intake 1968.42 ml  Output 300 ml  Net 1668.42 ml   Filed Weights   03/18/2019 1456 02/26/19 0500  Weight: 48.7 kg 50.6 kg    Examination:  General - intermittently agitated Eyes - pupils reactive ENT - no sinus tenderness, no stridor Cardiac - regular rate/rhythm, no  murmur Chest - equal breath sounds b/l, no wheezing or rales Abdomen - soft, non tender, + bowel sounds Extremities - decreased muscle bulk Skin - no rashes Neuro - moves extremities, not following commands  CXR - basilar ATX (reviewed by me)  Assessment & Plan:   Acute hepatic encephalopathy in setting of sepsis. Cirrhosis with ascites. Plan - previously followed by Dr. Collene Mares with GI - continue lactulose, rifaxamin - f/u ammonia level, monitor mental status  Severe sepsis with MSSA bacteremia. Plan - ABx per ID - f/u TTE  Acute hypoxic respiratory failure. Discussion: Likely from hypoventilation and atelectasis.  Maintaining airway at present. Plan - oxygen to keep SpO2 90 to 95% - monitor need for intubation - wouldn't be candidate for Bipap in current mental status - f/u CXR intermittently  CKD 4. Lactic acidosis. Hypokalemia, hypomagnesemia. Plan - f/u BMET, lactic acid - monitor urine outpt - d/c HCO3 from IV fluid - continue normal saline at 50 ml/hr  Anemia of critical illness, iron deficiency and chronic disease. Thrombocytopenia, most likely from sepsis and cirrhosis. Plan - f/u CBC - transfuse for Hb < 7, Plt < 10K, or bleeding - f/u [id number] from 3/04  Hx of hypothyroidism. Plan - continue synthroid  Hypoglycemia. Moderate protein calorie malnutrition. Plan - continue dextrose in IV fluid - add tube feeds  Hx HTN. Plan - Hold preadmission HCTZ, amlodipine.  Goals of care. Plan - updated family about current status and  severity of illness - they would like to continue all aggressive interventions  Best Practice:  Diet: tube feeds DVT prophylaxis: SCD's. GI prophylaxis: PPI. Mobility: Bedrest. Code Status: Full. Family Communication: updated daughter at bedside Disposition: ICU.  Labs    CMP Latest Ref Rng & Units 02/26/2019 02/25/2019 02/25/2019  Glucose 70 - 99 mg/dL 93 81 -  BUN 8 - 23 mg/dL 38(H) 42(H) -  Creatinine 0.44 - 1.00  mg/dL 2.84(H) 2.73(H) -  Sodium 135 - 145 mmol/L 141 142 -  Potassium 3.5 - 5.1 mmol/L 3.3(L) 4.1 -  Chloride 98 - 111 mmol/L 107 112(H) -  CO2 22 - 32 mmol/L 17(L) 10(L) -  Calcium 8.9 - 10.3 mg/dL 7.8(L) 7.5(L) -  Total Protein 6.5 - 8.1 g/dL 4.9(L) - -  Total Bilirubin 0.3 - 1.2 mg/dL 2.9(H) - 2.9(H)  Alkaline Phos 38 - 126 U/L 124 - -  AST 15 - 41 U/L 287(H) - -  ALT 0 - 44 U/L 81(H) - -   CBC Latest Ref Rng & Units 02/26/2019 02/25/2019 02/25/2019  WBC 4.0 - 10.5 K/uL 13.5(H) - 16.5(H)  Hemoglobin 12.0 - 15.0 g/dL 7.9(L) 8.5(L) 7.2(L)  Hematocrit 36.0 - 46.0 % 22.5(L) 25.0(L) 21.9(L)  Platelets 150 - 400 K/uL 36(L) - 60(L)   Lab Results  Component Value Date   INR 1.8 (H) 02/26/2019   INR 1.9 (H) 02/25/2019   INR 2.9 (H) 01/09/2009   ABG    Component Value Date/Time   PHART 7.411 02/26/2019 0311   PCO2ART 33.0 02/26/2019 0311   PO2ART 80.8 (L) 02/26/2019 0311   HCO3 20.6 02/26/2019 0311   TCO2 16 (L) 02/25/2019 0309   ACIDBASEDEF 3.3 (H) 02/26/2019 0311   O2SAT 95.9 02/26/2019 0311   CBG (last 3)  Recent Labs    02/26/19 0003 02/26/19 0330 02/26/19 0741  GLUCAP 150* 105* 87    CC time 33 minutes  Chesley Mires, MD [place] 02/26/2019, 10:38 AM

## 2019-02-26 NOTE — Progress Notes (Signed)
eLink Physician-Brief Progress Note Patient Name: [name] DOB: 25-May-1946 MRN: [id number]   Date of Service  02/26/2019  HPI/Events of Note  Pt agitated and pulling at lines  eICU Interventions  Restraints order renewed.        Kerry Kass Ogan 02/26/2019, 3:48 AM

## 2019-02-26 NOTE — Progress Notes (Addendum)
Initial Nutrition Assessment  DOCUMENTATION CODES:   Not applicable  INTERVENTION:    Jevity 1.2 at 55 ml/h (1320 ml per day)   Provides 1584 kcal, 73 gm protein, 1069 ml free water daily  NUTRITION DIAGNOSIS:   Inadequate oral intake related to inability to eat as evidenced by NPO status.  GOAL:   Patient will meet greater than or equal to 90% of their needs  MONITOR:   TF tolerance, Labs, Skin, I & O's  REASON FOR ASSESSMENT:   Consult Enteral/tube feeding initiation and management  ASSESSMENT:   73 yo female with PMH of thyroid DZ, HTN, liver DZ, anemia, renal disorder who was admitted with AMS, elevated ammonia, lactic acidosis. Transferred to the ICU 3/4.   Received MD Consult for TF initiation and management. NGT in place. Patient is not alert enough to safely take PO's.   Labs reviewed. Potassium 3.3 (L), BUN 38 (H), creatinine 2.84 (H), magnesium 2.3 WNL Medications reviewed and include lactulose.   Patient unable to provide any nutrition history. She has a history of severe malnutrition. Weight is up ~14 lbs since 1 year ago.  Suspect malnutrition is ongoing. Unable to assess all fat and muscle areas for NFPE at this time.  NUTRITION - FOCUSED PHYSICAL EXAM:    Most Recent Value  Orbital Region  Mild depletion  Upper Arm Region  No depletion  Thoracic and Lumbar Region  Unable to assess  Buccal Region  Unable to assess  Temple Region  Moderate depletion  Clavicle Bone Region  Moderate depletion  Clavicle and Acromion Bone Region  Moderate depletion  Scapular Bone Region  Unable to assess  Dorsal Hand  Unable to assess  Patellar Region  No depletion  Anterior Thigh Region  No depletion  Posterior Calf Region  Mild depletion  Edema (RD Assessment)  Moderate  Hair  Reviewed  Eyes  Reviewed  Mouth  Unable to assess  Skin  Reviewed  Nails  Reviewed       Diet Order:   Diet Order            Diet NPO time specified  Diet effective now              EDUCATION NEEDS:   No education needs have been identified at this time  Skin:  Skin Assessment: Reviewed RN Assessment  Last BM:  3/5 (rectal tube)  Height:   Ht Readings from Last 1 Encounters:  03/24/2019 $RemoveB'5\' 1"'bageFBEP$  (1.549 m)    Weight:   Wt Readings from Last 1 Encounters:  02/26/19 50.6 kg    Ideal Body Weight:  47.7 kg  BMI:  Body mass index is 21.08 kg/m.  Estimated Nutritional Needs:   Kcal:  1400-1600  Protein:  70-80 gm  Fluid:  >/= 1.4 L    Molli [place], RD, LDN, [place] Pager [phone] After Hours Pager [phone]

## 2019-02-26 NOTE — Progress Notes (Signed)
[facility] NEPHROLOGY PROGRESS NOTE  Assessment/ Plan: Pt is a 73 y.o. yo female with history of hypertension, hypothyroidism, liver cirrhosis, CKD stage IV with baseline creatinine around 2.5-3 follows with Dr.Webb at [facility], admitted with confusion.  #Acute encephalopathy: Likely due to hepatic encephalopathy and sepsis.  Currently on antibiotics, lactulose per PCCM.  Do not think it is due to uremia.  #CKD stage IV: Serum creatinine level around baseline.  Currently has sepsis.  Carotid only 320 cc in 24 hours.  I will do bladder scan.  Monitor BMP, urine output.  Avoid nephrotoxins.  No need for dialysis.  Currently on low-dose dextrose and sodium bicarbonate.  Patient is n.p.o. therefore needs dextrose IV.  Chest x-ray with no pulmonary edema.  #Severe sepsis: Exact source unknown.  Currently on empiric antibiotics.  Has elevated lactic acid level.  Poor primary team.  #Liver cirrhosis: Per PCCM.  #Anemia:: Iron saturation low but unable to give IV iron because of sepsis.  Monitor CBC, transfusion as needed.  Schistocytes are reported in CBC.  Do not think TMA.  Follow-up Adamts13 level.  Hematology following.Marland Kitchen  #Thrombocytopenia likely due to liver disease and sepsis.  #Hypokalemia: Monitor labs.  Replete potassium as needed.  #Lactic acidosis: Due to sepsis, management as above.   Subjective: Seen and examined in ICU.  Patient is quite sedated likely due to high ammonia level.  On oxygen.  Review of system limited.  Patient's daughter and granddaughter at bedside.   Objective Vital signs in last 24 hours: Vitals:   02/26/19 0600 02/26/19 0700 02/26/19 0748 02/26/19 0800  BP: (!) 106/56 (!) 100/55  97/80  Pulse:      Resp: $Remo'16 14  13  'hlbsI$ Temp:   98.6 F (37 C)   TempSrc:   Axillary   SpO2:    97%  Weight:      Height:       Weight change: 1.9 kg  Intake/Output Summary (Last 24 hours) at 02/26/2019 0911 Last data filed at 02/26/2019 0800 Gross per 24  hour  Intake 2173.4 ml  Output 295 ml  Net 1878.4 ml       Labs: Basic Metabolic Panel: Recent Labs  Lab 02/28/2019 1726  02/25/19 0231 02/25/19 0309 02/25/19 1514 02/26/19 0556  NA  --    < > 140 142 142 141  K  --    < > 3.1* 3.2* 4.1 3.3*  CL  --    < > 111  --  112* 107  CO2  --    < > 14*  --  10* 17*  GLUCOSE  --    < > 77  --  81 93  BUN  --    < > 41*  --  42* 38*  CREATININE  --    < > 2.60*  --  2.73* 2.84*  CALCIUM  --    < > 7.0*  --  7.5* 7.8*  PHOS 5.1*  --  4.6  --   --   --    < > = values in this interval not displayed.   Liver Function Tests: Recent Labs  Lab 02/28/2019 1512 03/13/2019 2019 02/25/19 0231 02/25/19 0724 02/26/19 0556  AST 64* 68*  --   --  287*  ALT 35 35  --   --  81*  ALKPHOS 114 112  --   --  124  BILITOT 3.0* 3.2*  --  2.9* 2.9*  PROT 5.5* 5.2*  --   --  4.9*  ALBUMIN 2.9* 2.8* 2.4*  --  2.4*   No results for input(s): LIPASE, AMYLASE in the last 168 hours. Recent Labs  Lab 03/06/2019 2327 02/25/19 0231 02/26/19 0556  AMMONIA 88* 72* 50*   CBC: Recent Labs  Lab 03/12/2019 1512 03/24/2019 2019  02/25/19 0231 02/25/19 0309 02/26/19 0556  WBC 14.4* 15.3*  --  16.5*  --  13.5*  NEUTROABS 11.4* 11.5*  --   --   --   --   HGB 8.4* 8.1*   < > 7.2* 8.5* 7.9*  HCT 25.8* 24.0*   < > 21.9* 25.0* 22.5*  MCV 129.0* 129.0*  --  126.6*  --  125.0*  PLT PLATELET CLUMPS NOTED ON SMEAR, COUNT APPEARS DECREASED 53*  --  60*  --  36*   < > = values in this interval not displayed.   Cardiac Enzymes: Recent Labs  Lab 02/28/2019 1512  TROPONINI 0.04*   CBG: Recent Labs  Lab 02/25/19 1526 02/25/19 1946 02/26/19 0003 02/26/19 0330 02/26/19 0741  GLUCAP 77 118* 150* 105* 87    Iron Studies:  Recent Labs    03/05/2019 2124  IRON 21*  TIBC 235*  FERRITIN 131   Studies/Results: Ct Abdomen Pelvis Wo Contrast  Result Date: 02/25/2019 CLINICAL DATA:  Cirrhosis or Fatty Liver liver enzymes up in past. has never been told "cirrhosis"  before. Was turned loose by GI over a year ago. Now, US shows cirrhosis. No ETOH. ammonia level up. Sepsis of unknown origin. EXAM: CT ABDOMEN AND PELVIS WITHOUT CONTRAST TECHNIQUE: Multidetector CT imaging of the abdomen and pelvis was performed following the standard protocol without IV contrast. COMPARISON:  Abdominal ultrasound earlier this day. CT 01/23/2028 FINDINGS: Lower chest: Small bilateral pleural effusions. Coronary artery calcifications. Hepatobiliary: Macrolobular hepatic contours consistent with cirrhosis. No obvious hepatic lesion on noncontrast exam. Distended gallbladder with layering hyperdensity. Motion obscures evaluation of the biliary tree. Pancreas: Motion degraded evaluation. Parenchymal atrophy. Motion versus edema at the pancreatic head, image 35 series 3. Spleen: Normal in size. No gross focal abnormality. Adrenals/Urinary Tract: Left adrenal thickening without dominant nodule. Normal right adrenal gland. Bilateral renal cortical atrophy. No hydronephrosis. Small cyst in the left kidney. Tiny hyperdensity in the upper left kidney. Previous hyperdense lesion in the lower right kidney is not as well seen, may represent resolution of a hemorrhagic cyst. Urinary bladder physiologically distended. No bladder wall thickening. Stomach/Bowel: Patient motion artifact, paucity of intra-abdominal fat, intra-abdominal ascites, and lack of contrast significantly limits bowel assessment. Suspect gastric wall thickening, not well assessed. No small bowel obstruction. There is air in stool throughout the colon, occasionally air appears slightly circumferential, for example at the hepatic flexure, image 6 series 3. Probable wall thickening of the ascending colon. The appendix is visualized and normal. There is pelvic floor descent with rectocele. Vascular/Lymphatic: Advanced aorta bi-iliac atherosclerosis. No portal venous or mesenteric gas. Significantly limited assessment for adenopathy. Reproductive:  Atrophic uterus, normal for age. Adnexa not well assessed. Other: Pelvic floor descent. Moderate abdominopelvic ascites. No evidence for free air, however evaluation significantly limited. Musculoskeletal: Anterolisthesis of L5 on S1 is likely facet mediated. Degenerative disc disease at L3-L4 and L5-S1. IMPRESSION: 1. Hepatic cirrhosis with moderate ascites. 2. Colonic wall thickening of the ascending colon. Air in stool throughout the colon, some of this air appears circumferential. While this may represent air mixed with stool, in the setting of sepsis and lactic acidosis, colonic pneumatosis is considered and is suspicious for ischemia. Bowel evaluation  is technically limited given patient motion, lack of IV contrast, abdominal ascites and paucity of intra-abdominal fat. No evidence of free air. 3. Pelvic floor descent with rectocele. 4. Advanced aorta bi-iliac atherosclerosis. Aortic Atherosclerosis (ICD10-I70.0). 5. Additional chronic findings as described. Critical Value/emergent results were called by telephone at the time of interpretation on 02/25/2019 at 12:01 am to NP [facility] , who verbally acknowledged these results. Electronically Signed   By: Keith Rake M.D.   On: 02/25/2019 00:02   Dg Chest 2 View  Result Date: 03/03/2019 CLINICAL DATA:  Altered mental status EXAM: CHEST - 2 VIEW COMPARISON:  06/21/2018 FINDINGS: Decreased lung volume with mild bibasilar atelectasis. Negative for heart failure or effusion. Negative for pneumonia. IMPRESSION: Hypoventilation with mild bibasilar atelectasis. Electronically Signed   By: Franchot Gallo M.D.   On: 03/13/2019 17:03   Dg Pelvis 1-2 Views  Result Date: 03/19/2019 CLINICAL DATA:  Pain EXAM: PELVIS - 1-2 VIEW COMPARISON:  CT abdomen pelvis 01/22/2018 FINDINGS: Both hips are normal. No pelvic fracture or mass. Moderate to advanced degenerative change in the SI joints bilaterally. Lumbar scoliosis and degenerative change. IMPRESSION:  Negative for fracture. Electronically Signed   By: Franchot Gallo M.D.   On: 02/27/2019 17:06   Dg Abd 1 View  Result Date: 02/25/2019 CLINICAL DATA:  Gastric tube placement EXAM: ABDOMEN - 1 VIEW COMPARISON:  02/25/2019 at 0157 hours FINDINGS: The tip and side port of a gastric tube are noted in the left upper quadrant in the expected location of the stomach. Nonspecific bowel gas pattern is noted. Degenerative disc space narrowing at L4-5 and L5-S1 with facet arthropathy is again noted resulting in gentle levoconvex curvature of the lumbar spine. Aortoiliac atherosclerosis is identified. Sclerosis of the SI joints bilaterally in keeping with osteoarthritis or sacroiliitis is noted. IMPRESSION: 1. Gastric tube in the expected location of the stomach as above. 2. Levoscoliosis of the lumbar spine with lower lumbar degenerative disc and facet arthropathy. 3. Sclerosis of the SI joints bilaterally likely to represent osteoarthritic change. Electronically Signed   By: Ashley Royalty M.D.   On: 02/25/2019 22:09   Dg Abd 1 View  Result Date: 02/25/2019 CLINICAL DATA:  NG tube placement. EXAM: ABDOMEN - 1 VIEW COMPARISON:  CT earlier this day. FINDINGS: Tip and side port of the enteric tube below the diaphragm in the stomach. Air in stool throughout the colon. Question pneumatosis on CT not confirmed on radiograph. Right abdomen not entirely included in the field of view. No evidence of free air. IMPRESSION: Tip and side port of the enteric tube below the diaphragm in the stomach. Electronically Signed   By: Keith Rake M.D.   On: 02/25/2019 02:20   Ct Head Wo Contrast  Result Date: 02/23/2019 CLINICAL DATA:  AMS, no trauma EXAM: CT HEAD WITHOUT CONTRAST TECHNIQUE: Contiguous axial images were obtained from the base of the skull through the vertex without intravenous contrast. COMPARISON:  None. FINDINGS: Brain: Left-sided cochlear implant and related dense metallic streak artifact somewhat limits evaluation of  the left hemispheric brain parenchyma. No evidence of acute infarction, hemorrhage, hydrocephalus, extra-axial collection or mass lesion/mass effect. Vascular: No hyperdense vessel or unexpected calcification. Skull: Normal. Negative for fracture or focal lesion. Sinuses/Orbits: No acute finding. Other: None. IMPRESSION: Left-sided cochlear implant and related dense metallic streak artifact somewhat limits evaluation of the left hemispheric brain parenchyma. Within this limitation, no acute intracranial pathology. Electronically Signed   By: Eddie Candle M.D.   On: 03/01/2019  15:54   Ct Femur Right Wo Contrast  Result Date: 03/22/2019 CLINICAL DATA:  Sepsis of unknown origin EXAM: CT OF THE LOWER RIGHT EXTREMITY WITHOUT CONTRAST TECHNIQUE: Multidetector CT imaging of the right lower extremity was performed according to the standard protocol. COMPARISON:  Radiograph 02/25/2019 FINDINGS: Bones/Joint/Cartilage Nondiagnostic images of the distal femur at the level of the knee secondary to patient motion. Imaged portions of the proximal mid and distal femur show no fracture or periostitis. No bony destruction is evident. No significant hip effusion. Ligaments Suboptimally assessed by CT. Muscles and Tendons Mild atrophy of the hip and thigh muscles. No gross intramuscular fluid collections. Soft tissues Diffuse subcutaneous edema. No focal fluid collections. Ascites within the abdomen and pelvis. IMPRESSION: Motion degraded study despite several imaging attempts. Nondiagnostic evaluation of the distal femur at the level of the prosthesis. Imaged portions of the proximal mid and distal right femur show no acute osseous abnormality. Non-specific edema within the subcutaneous soft tissues without gross focal fluid collection. Electronically Signed   By: Donavan Foil M.D.   On: 03/10/2019 23:50   Dg Chest Port 1 View  Result Date: 02/26/2019 CLINICAL DATA:  Respiratory failure EXAM: PORTABLE CHEST 1 VIEW COMPARISON:   03/05/2019 FINDINGS: Cardiac shadows within normal limits. Nasogastric catheter extends into the stomach. Bibasilar atelectasis is noted increased from the prior exam. No focal confluent infiltrate or sizable effusion is seen. IMPRESSION: Increasing bibasilar atelectasis. Electronically Signed   By: Inez Catalina M.D.   On: 02/26/2019 08:07   Dg Femur Min 2 Views Right  Result Date: 03/06/2019 CLINICAL DATA:  Pain EXAM: RIGHT FEMUR 2 VIEWS COMPARISON:  None. FINDINGS: Normal right hip joint. No femur fracture. Right knee replacement satisfactory position alignment without loosening. IMPRESSION: No acute abnormality.  Right knee replacement. Electronically Signed   By: Franchot Gallo M.D.   On: 02/27/2019 17:05   Ct Maxillofacial Wo Contrast  Result Date: 02/26/2019 CLINICAL DATA:  Right facial swelling. EXAM: CT MAXILLOFACIAL WITHOUT CONTRAST TECHNIQUE: Multidetector CT imaging of the maxillofacial structures was performed. Multiplanar CT image reconstructions were also generated. COMPARISON:  Head CT obtained earlier today. FINDINGS: Osseous: Previously noted left cochlear implant. Reversal of the normal cervical lordosis and multilevel degenerative changes. No acute maxillofacial bony abnormality. The patient is edentulous. Orbits: Unremarkable. Sinuses: Normally aerated. Soft tissues: The right parotid gland is significantly larger than the left parotid gland. On the right, the parotid gland measures 4.2 x 3.1 cm and on the left the parotid gland measures 2.5 x 2.4 cm on image number 49 series 8. There is also adjacent right lateral subcutaneous edema at the level of the parotid gland on the right, extending anteriorly. No parotid stone is visualized. Limited intracranial: Unremarkable. IMPRESSION: 1. Changes of parotitis on the right with no abscess visible, limited by the lack of intravenous contrast. 2. Cervical spine degenerative changes and reversal of the normal cervical lordosis. Electronically Signed    By: Claudie Revering M.D.   On: 03/09/2019 17:44   US Abdomen Limited Ruq  Result Date: 03/11/2019 CLINICAL DATA:  Right upper quadrant pain x1 day EXAM: ULTRASOUND ABDOMEN LIMITED RIGHT UPPER QUADRANT COMPARISON:  01/22/2018 CT FINDINGS: Limited study as the patient deferred repositioning for better images. Gallbladder: Distended gallbladder without mural thickening however there is a significant amount of biliary sludge and admixed punctate gallstones noted. No secondary signs of acute cholecystitis. Common bile duct: Not visualized. Liver: Coarsened echotexture with morphologic changes of cirrhosis. No discrete mass. Small to moderate  volume of ascites is identified outlining the liver. Portal vein is patent on color Doppler imaging with normal direction of blood flow towards the liver. IMPRESSION: 1. Morphologic changes of cirrhosis with ascites. 2. Biliary sludge and tiny calculi are noted within the distended gallbladder without secondary signs of acute cholecystitis. Electronically Signed   By: Ashley Royalty M.D.   On: 03/23/2019 18:08    Medications: Infusions: . sodium chloride 10 mL/hr at 02/26/19 0800  .  ceFAZolin (ANCEF) IV Stopped (02/25/19 2331)  .  sodium bicarbonate  infusion 1000 mL 50 mL/hr at 02/26/19 0800    Scheduled Medications: . Chlorhexidine Gluconate Cloth  6 each Topical Q0600  . lactulose  30 g Per Tube TID  . mouth rinse  15 mL Mouth Rinse BID  . pantoprazole (PROTONIX) IV  40 mg Intravenous Daily  . rifaximin  550 mg Oral BID    have reviewed scheduled and prn medications.  Physical Exam: General: Ill-looking female lying in bed with oxygen mask. Heart:RRR, s1s2 nl, no rubs Lungs: Creased breath sound bilateral, no wheezing Abdomen:soft, Non-tender, bowel sounds positive Extremities:No edema Neurology: Somnolent and not really following commands today  Dron Prasad Bhandari 02/26/2019,9:11 AM  LOS: 2 days

## 2019-02-26 NOTE — Progress Notes (Signed)
[place]   Reason for visit: Follow up on bacteremia  Interval History: a little more alert but confused, agitated; family at bedside.  WBC improved some, afebrile.     Physical Exam: Constitutional:  Vitals:   02/26/19 0800 02/26/19 0900  BP: 97/80 (!) 104/56  Pulse:    Resp: 13 15  Temp:    SpO2: 97%    patient appears in no distress Eyes: anicteric HENT:+ET GI: soft, nt, nd Skin: no rashes Neuro: encephalopathic  Review of Systems: Unable to be assessed due to patient factors  Lab Results  Component Value Date   WBC 13.5 (H) 02/26/2019   HGB 7.9 (L) 02/26/2019   HCT 22.5 (L) 02/26/2019   MCV 125.0 (H) 02/26/2019   PLT 36 (L) 02/26/2019    Lab Results  Component Value Date   CREATININE 2.84 (H) 02/26/2019   BUN 38 (H) 02/26/2019   NA 141 02/26/2019   K 3.3 (L) 02/26/2019   CL 107 02/26/2019   CO2 17 (L) 02/26/2019    Lab Results  Component Value Date   ALT 81 (H) 02/26/2019   AST 287 (H) 02/26/2019   ALKPHOS 124 02/26/2019     Microbiology: Recent Results (from the past 240 hour(s))  Urine culture     Status: Abnormal   Collection Time: 02/27/2019  5:04 PM  Result Value Ref Range Status   Specimen Description URINE, RANDOM  Final   Special Requests   Final    NONE Performed at [facility], [place] [address], [place], [place] [address]    Culture >=100,000 COLONIES/mL ESCHERICHIA COLI (A)  Final   Report Status 02/26/2019 FINAL  Final   Organism ID, Bacteria ESCHERICHIA COLI (A)  Final      Susceptibility   Escherichia coli - MIC*    AMPICILLIN >=32 RESISTANT Resistant     CEFAZOLIN >=64 RESISTANT Resistant     CEFTRIAXONE <=1 SENSITIVE Sensitive     CIPROFLOXACIN <=0.25 SENSITIVE Sensitive     GENTAMICIN <=1 SENSITIVE Sensitive     IMIPENEM <=0.25 SENSITIVE Sensitive     NITROFURANTOIN <=16 SENSITIVE Sensitive     TRIMETH/SULFA <=20 SENSITIVE Sensitive     AMPICILLIN/SULBACTAM >=32 RESISTANT Resistant    PIP/TAZO <=4 SENSITIVE Sensitive     Extended ESBL NEGATIVE Sensitive     * >=100,000 COLONIES/mL ESCHERICHIA COLI  Blood culture (routine x 2)     Status: Abnormal (Preliminary result)   Collection Time: 03/22/2019  6:20 PM  Result Value Ref Range Status   Specimen Description BLOOD LEFT ARM  Final   Special Requests   Final    BOTTLES DRAWN AEROBIC ONLY Blood Culture adequate volume   Culture  Setup Time   Final    GRAM POSITIVE COCCI IN CLUSTERS AEROBIC BOTTLE ONLY CRITICAL VALUE NOTED.  VALUE IS CONSISTENT WITH PREVIOUSLY REPORTED AND CALLED VALUE. Performed at [facility], [place] [address] [place], [place] [address]    Culture STAPHYLOCOCCUS AUREUS (A)  Final   Report Status PENDING  Incomplete  Blood culture (routine x 2)     Status: Abnormal (Preliminary result)   Collection Time: 03/16/2019  6:22 PM  Result Value Ref Range Status   Specimen Description BLOOD RIGHT ANTECUBITAL  Final   Special Requests   Final    BOTTLES DRAWN AEROBIC AND ANAEROBIC Blood Culture results may not be optimal due to an inadequate volume of blood received in culture bottles   Culture  Setup Time   Final    GRAM POSITIVE COCCI IN CLUSTERS IN BOTH AEROBIC AND ANAEROBIC BOTTLES CRITICAL RESULT CALLED TO, READ BACK BY AND VERIFIED WITH: PHRMD D HANNA $Remov'@1142'[email or web address]$  02/25/2019 BY S GEZAHEGN    Culture (A)  Final    STAPHYLOCOCCUS AUREUS SUSCEPTIBILITIES TO FOLLOW Performed at [facility], [place] [address], [place], [place] [address]    Report Status PENDING  Incomplete  Blood Culture ID Panel (Reflexed)     Status: Abnormal   Collection Time: 03/01/2019  6:22 PM  Result Value Ref Range Status   Enterococcus species NOT DETECTED NOT DETECTED Final   Listeria monocytogenes NOT DETECTED NOT DETECTED Final   Staphylococcus species DETECTED (A) NOT DETECTED Final    Comment: CRITICAL RESULT CALLED TO, READ BACK BY AND VERIFIED WITH: PHRMD D HANNA $Remov'@1142'[email or web address]$  02/25/2019 BY S GEZAHEGN    Staphylococcus aureus  (BCID) DETECTED (A) NOT DETECTED Final    Comment: Methicillin (oxacillin) susceptible Staphylococcus aureus (MSSA). Preferred therapy is anti staphylococcal beta lactam antibiotic (Cefazolin or Nafcillin), unless clinically contraindicated. CRITICAL RESULT CALLED TO, READ BACK BY AND VERIFIED WITH: PHRMD D HANNA $Remov'@1142'SpuTOL$  02/25/2019 BY S GEZAHEGN    Methicillin resistance NOT DETECTED NOT DETECTED Final   Streptococcus species NOT DETECTED NOT DETECTED Final   Streptococcus agalactiae NOT DETECTED NOT DETECTED Final   Streptococcus pneumoniae NOT DETECTED NOT DETECTED Final   Streptococcus pyogenes NOT DETECTED NOT DETECTED Final   Acinetobacter baumannii NOT DETECTED NOT DETECTED Final   Enterobacteriaceae species NOT DETECTED NOT DETECTED Final   Enterobacter cloacae complex NOT DETECTED NOT DETECTED Final   Escherichia coli NOT DETECTED NOT DETECTED Final   Klebsiella oxytoca NOT DETECTED NOT DETECTED Final   Klebsiella pneumoniae NOT DETECTED NOT DETECTED Final   Proteus species NOT DETECTED NOT DETECTED Final   Serratia marcescens NOT DETECTED NOT DETECTED Final   Haemophilus influenzae NOT DETECTED NOT DETECTED Final   Neisseria meningitidis NOT DETECTED NOT DETECTED Final   Pseudomonas aeruginosa NOT DETECTED NOT DETECTED Final   Candida albicans NOT DETECTED NOT DETECTED Final   Candida glabrata NOT DETECTED NOT DETECTED Final   Candida krusei NOT DETECTED NOT DETECTED Final   Candida parapsilosis NOT DETECTED NOT DETECTED Final   Candida tropicalis NOT DETECTED NOT DETECTED Final    Comment: Performed at [facility], [place] [address] [place], [place] [address]  MRSA PCR Screening     Status: None   Collection Time: 02/25/19 12:58 AM  Result Value Ref Range Status   MRSA by PCR NEGATIVE NEGATIVE Final    Comment:        The GeneXpert MRSA Assay (FDA approved for NASAL specimens only), is one component of a comprehensive MRSA colonization surveillance program. It is  not intended to diagnose MRSA infection nor to guide or monitor treatment for MRSA infections. Performed at [facility], [place] [address], [place],  [address]     Impression/Plan:  1. MSSA bacteremia - on cefazolin and will continue.  Repeat cultures sent.   TTE being done now.   2.  Thrombocytopenia - remains low.   Will continue to monitor  3.  Creat - remains up, no indication for dialysis at this time per nephrology.   4. Encephalopathy - some mild improvement.  Likely hepatic encephalopathy and sepsis.    5.  Sepsis - as above, MSSA.

## 2019-02-26 NOTE — Progress Notes (Signed)
eLink Physician-Brief Progress Note Patient Name: [name] DOB: [birth year] MRN: [id number]   Date of Service  02/26/2019  HPI/Events of Note  Encephalopathic with resultant agitation. Dr Gilford Raid is in the room at the time of my camera in.  eICU Interventions  Will defer Rx to Dr, Gilford Raid who is evaluating patient.        Kerry Kass Ogan 02/26/2019, 8:58 PM

## 2019-02-26 NOTE — Progress Notes (Signed)
PCCM Overnight  Evaluated patient at 8:59 pm  Pt is alert protecting her airway on face mask Has been pulling at her lines and NGT Appears to be uncomfortable ( episodic)  Hard of hearing so difficult to calm  Responds to direct eye contact Hemodynamically stable.  Discussed with RN Acidosis has improved and Ammonia levels continue to decrease Pt may be in pain from rectal prolapse Will try to apply topical cream to see if it helps.  Despite face mask having humidification oral mucosa dry  Switching supplemental oxygen to see if that helps with oral dryness.  RN agrees with Plan   Signed Dr Seward [facility] Locums

## 2019-02-27 DIAGNOSIS — B9620 Unspecified Escherichia coli [E. coli] as the cause of diseases classified elsewhere: Secondary | ICD-10-CM

## 2019-02-27 DIAGNOSIS — N3090 Cystitis, unspecified without hematuria: Secondary | ICD-10-CM

## 2019-02-27 LAB — COMPREHENSIVE METABOLIC PANEL
ALBUMIN: 2.6 g/dL — AB (ref 3.5–5.0)
ALT: 57 U/L — ABNORMAL HIGH (ref 0–44)
AST: 215 U/L — ABNORMAL HIGH (ref 15–41)
Alkaline Phosphatase: 140 U/L — ABNORMAL HIGH (ref 38–126)
Anion gap: 12 (ref 5–15)
BUN: 48 mg/dL — ABNORMAL HIGH (ref 8–23)
CO2: 20 mmol/L — ABNORMAL LOW (ref 22–32)
Calcium: 8.1 mg/dL — ABNORMAL LOW (ref 8.9–10.3)
Chloride: 112 mmol/L — ABNORMAL HIGH (ref 98–111)
Creatinine, Ser: 3.03 mg/dL — ABNORMAL HIGH (ref 0.44–1.00)
GFR calc Af Amer: 17 mL/min — ABNORMAL LOW (ref >60)
GFR calc non Af Amer: 15 mL/min — ABNORMAL LOW (ref >60)
GLUCOSE: 146 mg/dL — AB (ref 70–99)
Potassium: 3.6 mmol/L (ref 3.5–5.1)
Sodium: 144 mmol/L (ref 135–145)
Total Bilirubin: 2.9 mg/dL — ABNORMAL HIGH (ref 0.3–1.2)
Total Protein: 5.1 g/dL — ABNORMAL LOW (ref 6.5–8.1)

## 2019-02-27 LAB — CBC
HCT: 24.4 % — ABNORMAL LOW (ref 36.0–46.0)
Hemoglobin: 8.3 g/dL — ABNORMAL LOW (ref 12.0–15.0)
MCH: 43.7 pg — ABNORMAL HIGH (ref 26.0–34.0)
MCHC: 34 g/dL (ref 30.0–36.0)
MCV: 128.4 fL — AB (ref 80.0–100.0)
NRBC: 3.4 % — AB (ref 0.0–0.2)
Platelets: 42 10*3/uL — ABNORMAL LOW (ref 150–400)
RBC: 1.9 MIL/uL — ABNORMAL LOW (ref 3.87–5.11)
RDW: 27.9 % — ABNORMAL HIGH (ref 11.5–15.5)
WBC: 20.6 10*3/uL — ABNORMAL HIGH (ref 4.0–10.5)

## 2019-02-27 LAB — GLUCOSE, CAPILLARY
GLUCOSE-CAPILLARY: 156 mg/dL — AB (ref 70–99)
Glucose-Capillary: 178 mg/dL — ABNORMAL HIGH (ref 70–99)
Glucose-Capillary: 146 mg/dL — ABNORMAL HIGH (ref 70–99)
Glucose-Capillary: 141 mg/dL — ABNORMAL HIGH (ref 70–99)
Glucose-Capillary: 151 mg/dL — ABNORMAL HIGH (ref 70–99)

## 2019-02-27 LAB — PHOSPHORUS
Phosphorus: 2.8 mg/dL (ref 2.5–4.6)
Phosphorus: 2.2 mg/dL — ABNORMAL LOW (ref 2.5–4.6)

## 2019-02-27 LAB — ADAMTS13 ANTIBODY: ADAMTS13 Antibody: 7 Units/mL (ref <12)

## 2019-02-27 LAB — CULTURE, BLOOD (ROUTINE X 2): Special Requests: ADEQUATE

## 2019-02-27 LAB — AMMONIA: Ammonia: 55 umol/L — ABNORMAL HIGH (ref 9–35)

## 2019-02-27 LAB — MAGNESIUM
MAGNESIUM: 2.3 mg/dL (ref 1.7–2.4)
Magnesium: 2.4 mg/dL (ref 1.7–2.4)

## 2019-02-27 LAB — LACTIC ACID, PLASMA: Lactic Acid, Venous: 4.9 mmol/L (ref 0.5–1.9)

## 2019-02-27 LAB — ADAMTS13 ACTIVITY: Adamts 13 Activity: 22.1 % — CL (ref >66.8)

## 2019-02-27 MED ORDER — CHLORHEXIDINE GLUCONATE 0.12 % MT SOLN
15.0000 mL | Freq: Two times a day (BID) | OROMUCOSAL | Status: DC
  Administered 2019-02-27 – 2019-03-01 (×5): 15 mL via OROMUCOSAL

## 2019-02-27 MED ORDER — POTASSIUM CHLORIDE 20 MEQ/15ML (10%) PO SOLN
20.0000 meq | Freq: Once | ORAL | Status: AC
  Administered 2019-02-27: 20 meq
  Filled 2019-02-27: qty 15

## 2019-02-27 MED ORDER — ORAL CARE MOUTH RINSE
15.0000 mL | Freq: Two times a day (BID) | OROMUCOSAL | Status: DC
  Administered 2019-02-28 – 2019-03-01 (×4): 15 mL via OROMUCOSAL

## 2019-02-27 MED ORDER — ALBUMIN HUMAN 25 % IV SOLN
25.0000 g | Freq: Once | INTRAVENOUS | Status: AC
  Administered 2019-02-27: 25 g via INTRAVENOUS
  Filled 2019-02-27: qty 50

## 2019-02-27 NOTE — Progress Notes (Signed)
CRITICAL VALUE ALERT  Critical Value:  Lactic 4.9  Date & Time Notied:  02/27/19 0525  Provider Notified: [facility]/ Dr. Lucile Shutters  Orders Received/Actions taken:  Yes

## 2019-02-27 NOTE — Progress Notes (Signed)
[facility] NEPHROLOGY PROGRESS NOTE  Assessment/ Plan: Pt is a 73 y.o. yo female with history of hypertension, hypothyroidism, liver cirrhosis, CKD stage IV with baseline creatinine around 2.5-3 follows with Dr.Webb at [facility], admitted with confusion.  #Acute encephalopathy: Likely due to hepatic encephalopathy and sepsis.  Currently on antibiotics, lactulose per PCCM.  Do not think it is due to uremia.  #CKD stage IV: Serum creatinine level mildly trending up with low urine output.  Potassium level acceptable and chest x-ray with no pulmonary edema.  She does not look fluid overload on exam.  She may need dialysis if continue to worsen, I have discussed with the patient's daughter yesterday.  No urgent indication to start dialysis today.  She is on low-dose IV fluids because of n.p.o. status.  Given poor functional status and comorbidities, patient is a marginal candidate for long-term dialysis.  I recommend palliative care consult.  #Severe sepsis: Exact source unknown.  Currently on empiric antibiotics.  Has elevated lactic acid level.  As per primary team.  #Liver cirrhosis: Per PCCM.  #Anemia:: Iron saturation low but unable to give IV iron because of sepsis.  Monitor CBC, transfusion as needed.  Schistocytes are reported in CBC.  Do not think TMA.  Follow-up Adamts13 level.  Hematology following.Marland Kitchen  #Thrombocytopenia likely due to liver disease and sepsis.  #Hypokalemia: Monitor labs.  Potassium level acceptable  #Lactic acidosis: Due to sepsis, management as above.   Subjective: Seen and examined in ICU.  Patient remained confused.  Urine output of only 222 cc in 24 hours.  On Venturi mask.  No family at bedside today    Objective Vital signs in last 24 hours: Vitals:   02/27/19 0425 02/27/19 0500 02/27/19 0700 02/27/19 0834  BP:  (!) 133/98 129/73 129/73  Pulse:  (!) 102 100 (!) 102  Resp:  (!) 25 (!) 29 (!) 25  Temp: 97.8 F (36.6 C)  98 F (36.7 C)    TempSrc: Oral  Oral   SpO2:  100% 100% 94%  Weight:      Height:       Weight change: 4.7 kg  Intake/Output Summary (Last 24 hours) at 02/27/2019 0904 Last data filed at 02/27/2019 0700 Gross per 24 hour  Intake 2271.59 ml  Output 207 ml  Net 2064.59 ml       Labs: Basic Metabolic Panel: Recent Labs  Lab 02/25/19 1514 02/26/19 0556 02/26/19 1202 02/26/19 1653 02/27/19 0330  NA 142 141  --   --  144  K 4.1 3.3*  --   --  3.6  CL 112* 107  --   --  112*  CO2 10* 17*  --   --  20*  GLUCOSE 81 93  --   --  146*  BUN 42* 38*  --   --  48*  CREATININE 2.73* 2.84*  --   --  3.03*  CALCIUM 7.5* 7.8*  --   --  8.1*  PHOS  --   --  3.7 3.4 2.8   Liver Function Tests: Recent Labs  Lab 03/11/2019 2019 02/25/19 0231 02/25/19 0724 02/26/19 0556 02/27/19 0330  AST 68*  --   --  287* 215*  ALT 35  --   --  81* 57*  ALKPHOS 112  --   --  124 140*  BILITOT 3.2*  --  2.9* 2.9* 2.9*  PROT 5.2*  --   --  4.9* 5.1*  ALBUMIN 2.8* 2.4*  --  2.4* 2.6*   No results for input(s): LIPASE, AMYLASE in the last 168 hours. Recent Labs  Lab 02/25/19 0231 02/26/19 0556 02/27/19 0330  AMMONIA 72* 50* 55*   CBC: Recent Labs  Lab 03/10/2019 1512 02/23/2019 2019  02/25/19 0231 02/25/19 0309 02/26/19 0556 02/27/19 0330  WBC 14.4* 15.3*  --  16.5*  --  13.5* 20.6*  NEUTROABS 11.4* 11.5*  --   --   --   --   --   HGB 8.4* 8.1*   < > 7.2* 8.5* 7.9* 8.3*  HCT 25.8* 24.0*   < > 21.9* 25.0* 22.5* 24.4*  MCV 129.0* 129.0*  --  126.6*  --  125.0* 128.4*  PLT PLATELET CLUMPS NOTED ON SMEAR, COUNT APPEARS DECREASED 53*  --  60*  --  36* 42*   < > = values in this interval not displayed.   Cardiac Enzymes: Recent Labs  Lab 03/05/2019 1512  TROPONINI 0.04*   CBG: Recent Labs  Lab 02/26/19 1646 02/26/19 1949 02/26/19 2348 02/27/19 0424 02/27/19 0801  GLUCAP 128* 160* 163* 178* 146*    Iron Studies:  Recent Labs    03/14/2019 2124  IRON 21*  TIBC 235*  FERRITIN 131    Studies/Results: Dg Abd 1 View  Result Date: 02/25/2019 CLINICAL DATA:  Gastric tube placement EXAM: ABDOMEN - 1 VIEW COMPARISON:  02/25/2019 at 0157 hours FINDINGS: The tip and side port of a gastric tube are noted in the left upper quadrant in the expected location of the stomach. Nonspecific bowel gas pattern is noted. Degenerative disc space narrowing at L4-5 and L5-S1 with facet arthropathy is again noted resulting in gentle levoconvex curvature of the lumbar spine. Aortoiliac atherosclerosis is identified. Sclerosis of the SI joints bilaterally in keeping with osteoarthritis or sacroiliitis is noted. IMPRESSION: 1. Gastric tube in the expected location of the stomach as above. 2. Levoscoliosis of the lumbar spine with lower lumbar degenerative disc and facet arthropathy. 3. Sclerosis of the SI joints bilaterally likely to represent osteoarthritic change. Electronically Signed   By: Ashley Royalty M.D.   On: 02/25/2019 22:09   Dg Chest Port 1 View  Result Date: 02/26/2019 CLINICAL DATA:  Respiratory failure EXAM: PORTABLE CHEST 1 VIEW COMPARISON:  02/22/2019 FINDINGS: Cardiac shadows within normal limits. Nasogastric catheter extends into the stomach. Bibasilar atelectasis is noted increased from the prior exam. No focal confluent infiltrate or sizable effusion is seen. IMPRESSION: Increasing bibasilar atelectasis. Electronically Signed   By: Inez Catalina M.D.   On: 02/26/2019 08:07    Medications: Infusions: .  ceFAZolin (ANCEF) IV Stopped (02/26/19 2256)  . dextrose 5 % and 0.9% NaCl 50 mL/hr at 02/27/19 0500  . feeding supplement (JEVITY 1.2 CAL) 55 mL/hr at 02/27/19 0500    Scheduled Medications: . Chlorhexidine Gluconate Cloth  6 each Topical Q0600  . lactulose  30 g Per Tube BID  . levothyroxine  37.5 mcg Per Tube Daily  . mouth rinse  15 mL Mouth Rinse BID  . pantoprazole sodium  40 mg Per Tube Q24H  . rifaximin  550 mg Per Tube BID    have reviewed scheduled and prn  medications.  Physical Exam: General: Ill fragile looking female lying in bed, not following commands Heart:RRR, s1s2 nl, no rubs Lungs: Coarse breath sound bilateral, no wheezing Abdomen:soft, Non-tender, bowel sounds positive Extremities: No edema Neurology: Encephalopathic  Rosita Fire 02/27/2019,9:04 AM  LOS: 3 days

## 2019-02-27 NOTE — Progress Notes (Signed)
NAME:  [name], MRN:  [id number], DOB:  13-Jun-1946, LOS: 3 ADMISSION DATE:  03/24/2019, CONSULTATION DATE:  02/25/19 REFERRING MD:  Alfredia Ferguson  CHIEF COMPLAINT:  AMS   Brief History   73 yo female presented with altered mental status and elevated ammonia level and lactic acidosis.  Found to have liver cirrhosis and possible bowel ischemia.  Had progressive lactic acidosis and altered mental status and transferred to ICU.  Past Medical History  HTN, liver disease, hypothyroidism, CKD IV, Anemia.  Significant Hospital Events   3/3 > admit. 3/4 > transfer to ICU.  Consults:  Nephrology ID Hematology  Procedures:  TTE 3/05 >> EF greater that 83%, mod RV systolic dysfx, no vegetations  Significant Diagnostic Tests:  CT head 3/3 > left sided cochlear implant. CT MXLFCL 3/3 > changes of parotitis on the right without visible abscess. RUQ [place] 3/3 > cirrhosis with ascites.  Biliary sludge without signs of acute cholecystitis. CT A / P 3/4 > hepatic cirrhosis with moderate ascites, colonic wall thickening of the ascending colon.  Air in stool throughout possibly due to colonic pneumatosis and suspicious for ischemia.  Pelvic floor descent with rectocele. CT right femur 3/4 > neg.  Micro Data:  Blood 3/3 > Staph aureus Urine 3/3 > E coli  Antimicrobials:  Vanc 3/4 > 3/4 Cefepime 3/4 > 3/4  Flagyl 3/4 > 3/4 Ancef 3/4 >   Interim history/subjective:  Confused.  Objective:  Blood pressure 129/73, pulse (!) 102, temperature 98 F (36.7 C), temperature source Oral, resp. rate (!) 25, height $RemoveBe'5\' 1"'uvGKNOWMM$  (1.549 m), weight 55.3 kg, SpO2 94 %.        Intake/Output Summary (Last 24 hours) at 02/27/2019 0849 Last data filed at 02/27/2019 0700 Gross per 24 hour  Intake 2331.55 ml  Output 212 ml  Net 2119.55 ml   Filed Weights   03/17/2019 1456 02/26/19 0500 02/27/19 0421  Weight: 48.7 kg 50.6 kg 55.3 kg    Examination:  General - agitated Eyes - pupils reactive ENT - no stridor Cardiac -  regular rate/rhythm, no murmur Chest - scattered rhonchi Abdomen - soft, non tender, + bowel sounds Extremities - decreased muscle bulk Skin - no rashes Neuro - follows simple commands intermittently, moves extremities, confused   Assessment & Plan:   Acute hepatic encephalopathy in setting of sepsis. Cirrhosis with ascites. Plan - previously followed by Dr. Collene Mares with GI - continue lactulose, rifaxamine - monitor mental status  Severe sepsis with MSSA bacteremia. Plan - continue ABx per ID  Acute hypoxic respiratory failure. Discussion: Likely from hypoventilation and atelectasis.  Maintaining airway at present. Plan - oxygen to keep SpO2 90 to 95% - wouldn't be candidate for Bipap given mental status  CKD 4. Lactic acidosis. Hypokalemia, hypomagnesemia. Discussion: Renal fx and urine outpt progressively worse. Plan - f/u BMET, Lactic acid - monitor urine outpt - continue IV fluids - concerned she might require renal replacement therapy at some point - nephrology following  Anemia of critical illness, iron deficiency and chronic disease. Thrombocytopenia, most likely from sepsis and cirrhosis. Plan - f/u CBC - transfuse for Hb < 7, PLT < 10, or bleeding - f/u [id number] from 3/04  Hx of hypothyroidism. Plan - continue synthroid  Hypoglycemia. Moderate protein calorie malnutrition. Plan - continue dextrose in IV fluid - continue tube feeds   Hx HTN. Plan - Hold preadmission HCTZ, amlodipine.  Goals of care. Plan - updated family on 3/05 about current status and severity of illness -  they would like to continue all aggressive interventions  Best Practice:  Diet: tube feeds DVT prophylaxis: SCD's. GI prophylaxis: PPI. Mobility: Bedrest. Code Status: Full. Family Communication: no family at bedside Disposition: ICU.  Labs    CMP Latest Ref Rng & Units 02/27/2019 02/26/2019 02/25/2019  Glucose 70 - 99 mg/dL 146(H) 93 81  BUN 8 - 23 mg/dL 48(H) 38(H)  42(H)  Creatinine 0.44 - 1.00 mg/dL 3.03(H) 2.84(H) 2.73(H)  Sodium 135 - 145 mmol/L 144 141 142  Potassium 3.5 - 5.1 mmol/L 3.6 3.3(L) 4.1  Chloride 98 - 111 mmol/L 112(H) 107 112(H)  CO2 22 - 32 mmol/L 20(L) 17(L) 10(L)  Calcium 8.9 - 10.3 mg/dL 8.1(L) 7.8(L) 7.5(L)  Total Protein 6.5 - 8.1 g/dL 5.1(L) 4.9(L) -  Total Bilirubin 0.3 - 1.2 mg/dL 2.9(H) 2.9(H) -  Alkaline Phos 38 - 126 U/L 140(H) 124 -  AST 15 - 41 U/L 215(H) 287(H) -  ALT 0 - 44 U/L 57(H) 81(H) -   CBC Latest Ref Rng & Units 02/27/2019 02/26/2019 02/25/2019  WBC 4.0 - 10.5 K/uL 20.6(H) 13.5(H) -  Hemoglobin 12.0 - 15.0 g/dL 8.3(L) 7.9(L) 8.5(L)  Hematocrit 36.0 - 46.0 % 24.4(L) 22.5(L) 25.0(L)  Platelets 150 - 400 K/uL 42(L) 36(L) -   Lab Results  Component Value Date   INR 1.8 (H) 02/26/2019   INR 1.9 (H) 02/25/2019   INR 2.9 (H) 01/09/2009   ABG    Component Value Date/Time   PHART 7.411 02/26/2019 0311   PCO2ART 33.0 02/26/2019 0311   PO2ART 80.8 (L) 02/26/2019 0311   HCO3 20.6 02/26/2019 0311   TCO2 16 (L) 02/25/2019 0309   ACIDBASEDEF 3.3 (H) 02/26/2019 0311   O2SAT 95.9 02/26/2019 0311   CBG (last 3)  Recent Labs    02/26/19 2348 02/27/19 0424 02/27/19 0801  GLUCAP 163* 178* 146*    CC time 32 minutes  Chesley Mires, MD [facility] Pulmonary/Critical Care 02/27/2019, 8:49 AM

## 2019-02-27 NOTE — Progress Notes (Signed)
[facility] for Infectious Disease   Reason for visit: Follow up on bacteremia  Interval History: a little more alert but confused, calling out for her brother; family at bedside.  WBC up some, afebrile.     Physical Exam: Constitutional:  Vitals:   02/27/19 0834 02/27/19 0900  BP: 129/73 132/89  Pulse: (!) 102 100  Resp: (!) 25 (!) 23  Temp:    SpO2: 94% 99%   patient appears in no distress Eyes: anicteric CV: hyperdynamic sounding, no murmur GI: soft, nt, nd Skin: no rashes Neuro: delirius  Review of Systems: Unable to be assessed due to patient factors  Lab Results  Component Value Date   WBC 20.6 (H) 02/27/2019   HGB 8.3 (L) 02/27/2019   HCT 24.4 (L) 02/27/2019   MCV 128.4 (H) 02/27/2019   PLT 42 (L) 02/27/2019    Lab Results  Component Value Date   CREATININE 3.03 (H) 02/27/2019   BUN 48 (H) 02/27/2019   NA 144 02/27/2019   K 3.6 02/27/2019   CL 112 (H) 02/27/2019   CO2 20 (L) 02/27/2019    Lab Results  Component Value Date   ALT 57 (H) 02/27/2019   AST 215 (H) 02/27/2019   ALKPHOS 140 (H) 02/27/2019     Microbiology: Recent Results (from the past 240 hour(s))  Urine culture     Status: Abnormal   Collection Time: 03/17/2019  5:04 PM  Result Value Ref Range Status   Specimen Description URINE, RANDOM  Final   Special Requests   Final    NONE Performed at [facility], [address], [place], [place] [address]    Culture >=100,000 COLONIES/mL ESCHERICHIA COLI (A)  Final   Report Status 02/26/2019 FINAL  Final   Organism ID, Bacteria ESCHERICHIA COLI (A)  Final      Susceptibility   Escherichia coli - MIC*    AMPICILLIN >=32 RESISTANT Resistant     CEFAZOLIN >=64 RESISTANT Resistant     CEFTRIAXONE <=1 SENSITIVE Sensitive     CIPROFLOXACIN <=0.25 SENSITIVE Sensitive     GENTAMICIN <=1 SENSITIVE Sensitive     IMIPENEM <=0.25 SENSITIVE Sensitive     NITROFURANTOIN <=16 SENSITIVE Sensitive     TRIMETH/SULFA <=20 SENSITIVE Sensitive      AMPICILLIN/SULBACTAM >=32 RESISTANT Resistant     PIP/TAZO <=4 SENSITIVE Sensitive     Extended ESBL NEGATIVE Sensitive     * >=100,000 COLONIES/mL ESCHERICHIA COLI  Blood culture (routine x 2)     Status: Abnormal   Collection Time: 03/21/2019  6:20 PM  Result Value Ref Range Status   Specimen Description BLOOD LEFT ARM  Final   Special Requests   Final    BOTTLES DRAWN AEROBIC ONLY Blood Culture adequate volume   Culture  Setup Time   Final    GRAM POSITIVE COCCI IN CLUSTERS AEROBIC BOTTLE ONLY CRITICAL VALUE NOTED.  VALUE IS CONSISTENT WITH PREVIOUSLY REPORTED AND CALLED VALUE.    Culture (A)  Final    STAPHYLOCOCCUS AUREUS SUSCEPTIBILITIES PERFORMED ON PREVIOUS CULTURE WITHIN THE LAST 5 DAYS. Performed at [facility], [place] [address], [place], [place] [address]    Report Status 02/27/2019 FINAL  Final  Blood culture (routine x 2)     Status: Abnormal   Collection Time: 03/06/2019  6:22 PM  Result Value Ref Range Status   Specimen Description BLOOD RIGHT ANTECUBITAL  Final   Special Requests   Final    BOTTLES DRAWN  AEROBIC AND ANAEROBIC Blood Culture results may not be optimal due to an inadequate volume of blood received in culture bottles   Culture  Setup Time   Final    GRAM POSITIVE COCCI IN CLUSTERS IN BOTH AEROBIC AND ANAEROBIC BOTTLES CRITICAL RESULT CALLED TO, READ BACK BY AND VERIFIED WITH: PHRMD D HANNA $Remov'@1142'IqNmxz$  02/25/2019 BY S GEZAHEGN Performed at [facility], [place] [address] [place], [place] [address]    Culture STAPHYLOCOCCUS AUREUS (A)  Final   Report Status 02/27/2019 FINAL  Final   Organism ID, Bacteria STAPHYLOCOCCUS AUREUS  Final      Susceptibility   Staphylococcus aureus - MIC*    CIPROFLOXACIN <=0.5 SENSITIVE Sensitive     ERYTHROMYCIN 1 INTERMEDIATE Intermediate     GENTAMICIN <=0.5 SENSITIVE Sensitive     OXACILLIN <=0.25 SENSITIVE Sensitive     TETRACYCLINE <=1 SENSITIVE Sensitive     VANCOMYCIN <=0.5 SENSITIVE Sensitive      TRIMETH/SULFA <=10 SENSITIVE Sensitive     CLINDAMYCIN <=0.25 SENSITIVE Sensitive     RIFAMPIN <=0.5 SENSITIVE Sensitive     Inducible Clindamycin NEGATIVE Sensitive     * STAPHYLOCOCCUS AUREUS  Blood Culture ID Panel (Reflexed)     Status: Abnormal   Collection Time: 03/10/2019  6:22 PM  Result Value Ref Range Status   Enterococcus species NOT DETECTED NOT DETECTED Final   Listeria monocytogenes NOT DETECTED NOT DETECTED Final   Staphylococcus species DETECTED (A) NOT DETECTED Final    Comment: CRITICAL RESULT CALLED TO, READ BACK BY AND VERIFIED WITH: PHRMD D HANNA $Remov'@1142'mPpSSx$  02/25/2019 BY S GEZAHEGN    Staphylococcus aureus (BCID) DETECTED (A) NOT DETECTED Final    Comment: Methicillin (oxacillin) susceptible Staphylococcus aureus (MSSA). Preferred therapy is anti staphylococcal beta lactam antibiotic (Cefazolin or Nafcillin), unless clinically contraindicated. CRITICAL RESULT CALLED TO, READ BACK BY AND VERIFIED WITH: PHRMD D HANNA $Remov'@1142'WaeYIg$  02/25/2019 BY S GEZAHEGN    Methicillin resistance NOT DETECTED NOT DETECTED Final   Streptococcus species NOT DETECTED NOT DETECTED Final   Streptococcus agalactiae NOT DETECTED NOT DETECTED Final   Streptococcus pneumoniae NOT DETECTED NOT DETECTED Final   Streptococcus pyogenes NOT DETECTED NOT DETECTED Final   Acinetobacter baumannii NOT DETECTED NOT DETECTED Final   Enterobacteriaceae species NOT DETECTED NOT DETECTED Final   Enterobacter cloacae complex NOT DETECTED NOT DETECTED Final   Escherichia coli NOT DETECTED NOT DETECTED Final   Klebsiella oxytoca NOT DETECTED NOT DETECTED Final   Klebsiella pneumoniae NOT DETECTED NOT DETECTED Final   Proteus species NOT DETECTED NOT DETECTED Final   Serratia marcescens NOT DETECTED NOT DETECTED Final   Haemophilus influenzae NOT DETECTED NOT DETECTED Final   Neisseria meningitidis NOT DETECTED NOT DETECTED Final   Pseudomonas aeruginosa NOT DETECTED NOT DETECTED Final   Candida albicans NOT DETECTED NOT  DETECTED Final   Candida glabrata NOT DETECTED NOT DETECTED Final   Candida krusei NOT DETECTED NOT DETECTED Final   Candida parapsilosis NOT DETECTED NOT DETECTED Final   Candida tropicalis NOT DETECTED NOT DETECTED Final    Comment: Performed at [facility], [place]. [address] [place], [place] [address]  MRSA PCR Screening     Status: None   Collection Time: 02/25/19 12:58 AM  Result Value Ref Range Status   MRSA by PCR NEGATIVE NEGATIVE Final    Comment:        The GeneXpert MRSA Assay (FDA approved for NASAL specimens only), is one component of a comprehensive MRSA colonization surveillance program. It  is not intended to diagnose MRSA infection nor to guide or monitor treatment for MRSA infections. Performed at [facility], [place] [address], [place], [place] [address]     Impression/Plan:  1. MSSA bacteremia - on cefazolin and will continue.  Repeat cultures sent and ngtd.   TTE with no vegetation  2.  Thrombocytopenia - remains low.   Will continue to monitor  3.  Creat - remains up, will continue to monitor.   4. Encephalopathy - some mild improvement.  Likely hepatic encephalopathy and sepsis.    5.  Sepsis - as above, MSSA.   Positive urine culture with E coli.  Not in blood, no previous symptoms.  No indication for treatment.

## 2019-02-27 NOTE — Progress Notes (Signed)
eLink Physician-Brief Progress Note Patient Name: [name] DOB: 09-Dec-1946 MRN: [id number]   Date of Service  02/27/2019  HPI/Events of Note  Lactemia-likely a combination of liver failure and shock, K+ 3.6  eICU Interventions  25 % albumin 25 gm iv x 1, KCL 20 meq iv over 2 hours        [name] 02/27/2019, 5:38 AM

## 2019-02-27 NOTE — Progress Notes (Signed)
Called and spoke to [facility].  Pt CPOT score a 5; PRN Tylenol given with little/no effect. Pt continues to moan and yell "help me, oh please" and "it hurts". Pt turned several times for comfort with no change. Pt offered reassurance with no change. Pt confused and very hard of hearing and unable to determine what/where pt hurts.  Expressed concern with [facility] for pain management. Will continue to monitor.

## 2019-02-28 ENCOUNTER — Inpatient Hospital Stay (HOSPITAL_COMMUNITY): Payer: Medicare Other

## 2019-02-28 LAB — BLOOD GAS, ARTERIAL
Acid-base deficit: 0 mmol/L (ref 0.0–2.0)
Bicarbonate: 24.5 mmol/L (ref 20.0–28.0)
Drawn by: 10006
O2 Content: 7 L/min
O2 Saturation: 90.7 %
Patient temperature: 98.1
pCO2 arterial: 42.2 mmHg (ref 32.0–48.0)
pH, Arterial: 7.38 (ref 7.350–7.450)
pO2, Arterial: 61.3 mmHg — ABNORMAL LOW (ref 83.0–108.0)

## 2019-02-28 LAB — CBC
HCT: 22.8 % — ABNORMAL LOW (ref 36.0–46.0)
HEMOGLOBIN: 7.3 g/dL — AB (ref 12.0–15.0)
MCH: 42.9 pg — ABNORMAL HIGH (ref 26.0–34.0)
MCHC: 32 g/dL (ref 30.0–36.0)
MCV: 134.1 fL — ABNORMAL HIGH (ref 80.0–100.0)
Platelets: 28 10*3/uL — CL (ref 150–400)
RBC: 1.7 MIL/uL — ABNORMAL LOW (ref 3.87–5.11)
RDW: 29 % — ABNORMAL HIGH (ref 11.5–15.5)
WBC: 21.1 10*3/uL — ABNORMAL HIGH (ref 4.0–10.5)
nRBC: 2.2 % — ABNORMAL HIGH (ref 0.0–0.2)

## 2019-02-28 LAB — GLUCOSE, CAPILLARY
Glucose-Capillary: 102 mg/dL — ABNORMAL HIGH (ref 70–99)
Glucose-Capillary: 116 mg/dL — ABNORMAL HIGH (ref 70–99)
Glucose-Capillary: 124 mg/dL — ABNORMAL HIGH (ref 70–99)
Glucose-Capillary: 129 mg/dL — ABNORMAL HIGH (ref 70–99)
Glucose-Capillary: 139 mg/dL — ABNORMAL HIGH (ref 70–99)
Glucose-Capillary: 157 mg/dL — ABNORMAL HIGH (ref 70–99)

## 2019-02-28 LAB — RENAL FUNCTION PANEL
Albumin: 2.7 g/dL — ABNORMAL LOW (ref 3.5–5.0)
Anion gap: 12 (ref 5–15)
BUN: 56 mg/dL — ABNORMAL HIGH (ref 8–23)
CO2: 22 mmol/L (ref 22–32)
Calcium: 8.5 mg/dL — ABNORMAL LOW (ref 8.9–10.3)
Chloride: 115 mmol/L — ABNORMAL HIGH (ref 98–111)
Creatinine, Ser: 2.64 mg/dL — ABNORMAL HIGH (ref 0.44–1.00)
GFR calc Af Amer: 20 mL/min — ABNORMAL LOW
GFR calc non Af Amer: 17 mL/min — ABNORMAL LOW
Glucose, Bld: 160 mg/dL — ABNORMAL HIGH (ref 70–99)
Phosphorus: 2.6 mg/dL (ref 2.5–4.6)
Potassium: 2.9 mmol/L — ABNORMAL LOW (ref 3.5–5.1)
Sodium: 149 mmol/L — ABNORMAL HIGH (ref 135–145)

## 2019-02-28 LAB — LACTIC ACID, PLASMA: Lactic Acid, Venous: 2.6 mmol/L (ref 0.5–1.9)

## 2019-02-28 MED ORDER — KETOROLAC TROMETHAMINE 15 MG/ML IJ SOLN
15.0000 mg | Freq: Once | INTRAMUSCULAR | Status: AC
  Administered 2019-02-28: 15 mg via INTRAVENOUS
  Filled 2019-02-28 (×2): qty 1

## 2019-02-28 MED ORDER — POTASSIUM CHLORIDE 20 MEQ/15ML (10%) PO SOLN
40.0000 meq | ORAL | Status: AC
  Administered 2019-02-28 (×2): 40 meq
  Filled 2019-02-28 (×2): qty 30

## 2019-02-28 MED ORDER — WHITE PETROLATUM EX OINT
TOPICAL_OINTMENT | CUTANEOUS | Status: AC
  Administered 2019-03-01: 0.2
  Filled 2019-02-28: qty 28.35

## 2019-02-28 MED ORDER — FREE WATER
200.0000 mL | Freq: Four times a day (QID) | Status: DC
  Administered 2019-02-28 – 2019-03-01 (×8): 200 mL

## 2019-02-28 MED ORDER — DEXTROSE-NACL 5-0.45 % IV SOLN
INTRAVENOUS | Status: DC
  Administered 2019-02-28 – 2019-03-03 (×5): via INTRAVENOUS

## 2019-02-28 NOTE — Progress Notes (Signed)
eLink Physician-Brief Progress Note Patient Name: Kalyani Maeda DOB: [birth year] MRN: [id number]   Date of Service  02/28/2019  HPI/Events of Note  Hypokalemia  eICU Interventions  Potassium replaced     Intervention Category Intermediate Interventions: Electrolyte abnormality - evaluation and management  DETERDING,ELIZABETH 02/28/2019, 6:43 AM

## 2019-02-28 NOTE — Progress Notes (Signed)
CRITICAL VALUE ALERT  Critical Value:  plts 28  Date & Time Notied:  02/28/2019 @ 0654  Provider Notified: Warren Lacy  Orders Received/Actions taken: MD notified

## 2019-02-28 NOTE — Progress Notes (Signed)
NAME:  [name], MRN:  [id number], DOB:  1946/10/24, LOS: 4 ADMISSION DATE:  03/11/2019, CONSULTATION DATE:  02/25/19 REFERRING MD:  Alfredia Ferguson  CHIEF COMPLAINT:  AMS   Brief History   73 yo female presented with altered mental status and elevated ammonia level and lactic acidosis.  Found to have liver cirrhosis and possible bowel ischemia.  Had progressive lactic acidosis and altered mental status and transferred to ICU.  Past Medical History  HTN, liver disease, hypothyroidism, CKD IV, Anemia.  Significant Hospital Events   3/3 > admit. 3/4 > transfer to ICU.  Consults:  Nephrology ID Hematology  Procedures:  TTE 3/05 >> EF greater that 09%, mod RV systolic dysfx, no vegetations  Significant Diagnostic Tests:  CT head 3/3 > left sided cochlear implant. CT MXLFCL 3/3 > changes of parotitis on the right without visible abscess. RUQ [place] 3/3 > cirrhosis with ascites.  Biliary sludge without signs of acute cholecystitis. CT A / P 3/4 > hepatic cirrhosis with moderate ascites, colonic wall thickening of the ascending colon.  Air in stool throughout possibly due to colonic pneumatosis and suspicious for ischemia.  Pelvic floor descent with rectocele. CT right femur 3/4 > neg.  Micro Data:  Blood 3/3 > Staph aureus Urine 3/3 > E coli  Antimicrobials:  Vanc 3/4 > 3/4 Cefepime 3/4 > 3/4  Flagyl 3/4 > 3/4 Ancef 3/4 >   Interim history/subjective:  Was able to recognize her daughter this morning.  Objective:  Blood pressure 98/60, pulse 90, temperature (S) (!) 95 F (35 C), temperature source Rectal, resp. rate 16, height $RemoveBe'5\' 1"'[id number]$  (1.549 m), weight 57.1 kg, SpO2 98 %.        Intake/Output Summary (Last 24 hours) at 02/28/2019 0715 Last data filed at 02/28/2019 0700 Gross per 24 hour  Intake 3307.65 ml  Output 330 ml  Net 2977.65 ml   Filed Weights   02/26/19 0500 02/27/19 0421 02/28/19 0440  Weight: 50.6 kg 55.3 kg 57.1 kg    Examination:  General - alert Eyes - pupils  reactive ENT - no sinus tenderness, no stridor Cardiac - regular rate/rhythm, no murmur Chest - equal breath sounds b/l, no wheezing or rales Abdomen - soft, non tender, + bowel sounds Extremities - 1+ edema Skin - no rashes Neuro - confused, moves extremities   Assessment & Plan:   Acute hepatic encephalopathy in setting of sepsis. Cirrhosis with ascites. Plan - previously followed by Dr. Collene Mares with GI - continue lactulose and rifaxamine - monitor mental status  Severe sepsis with MSSA bacteremia. Plan - continue ABx per ID  Acute hypoxic respiratory failure. Discussion: Likely from hypoventilation and atelectasis.  Maintaining airway at present. Plan - oxygen to keep SpO2 90 to 95% - wouldn't be candidate for Bipap given mental status  CKD 4. Lactic acidosis >> improved. Hypernatremia. Hypokalemia, hypomagnesemia. Discussion: Renal fx and urine outpt progressively worse. Plan - replace electrolytes as needed - add free water - monitor urine outpt - nephrology following  Anemia of critical illness, iron deficiency and chronic disease. Thrombocytopenia, most likely from sepsis and cirrhosis. Plan - f/u CBC - transfuse for Hb < 7, PLT < 10, or bleeding  Hx of hypothyroidism. Plan - continue synthroid  Hypoglycemia. Moderate protein calorie malnutrition. Plan - continue tube feeds  Hx HTN. Plan - Hold preadmission HCTZ, amlodipine.  Goals of care. Plan - updated family on 3/05 about current status and severity of illness - they would like to continue all aggressive  interventions  Best Practice:  Diet: tube feeds DVT prophylaxis: SCD's. GI prophylaxis: PPI. Mobility: Bedrest. Code Status: Full. Family Communication: updated family at bedside Disposition: ICU.  Labs    CMP Latest Ref Rng & Units 02/28/2019 02/27/2019 02/26/2019  Glucose 70 - 99 mg/dL 160(H) 146(H) 93  BUN 8 - 23 mg/dL 56(H) 48(H) 38(H)  Creatinine 0.44 - 1.00 mg/dL 2.64(H) 3.03(H)  2.84(H)  Sodium 135 - 145 mmol/L 149(H) 144 141  Potassium 3.5 - 5.1 mmol/L 2.9(L) 3.6 3.3(L)  Chloride 98 - 111 mmol/L 115(H) 112(H) 107  CO2 22 - 32 mmol/L 22 20(L) 17(L)  Calcium 8.9 - 10.3 mg/dL 8.5(L) 8.1(L) 7.8(L)  Total Protein 6.5 - 8.1 g/dL - 5.1(L) 4.9(L)  Total Bilirubin 0.3 - 1.2 mg/dL - 2.9(H) 2.9(H)  Alkaline Phos 38 - 126 U/L - 140(H) 124  AST 15 - 41 U/L - 215(H) 287(H)  ALT 0 - 44 U/L - 57(H) 81(H)   CBC Latest Ref Rng & Units 02/28/2019 02/27/2019 02/26/2019  WBC 4.0 - 10.5 K/uL 21.1(H) 20.6(H) 13.5(H)  Hemoglobin 12.0 - 15.0 g/dL 7.3(L) 8.3(L) 7.9(L)  Hematocrit 36.0 - 46.0 % 22.8(L) 24.4(L) 22.5(L)  Platelets 150 - 400 K/uL 28(LL) 42(L) 36(L)   Lab Results  Component Value Date   INR 1.8 (H) 02/26/2019   INR 1.9 (H) 02/25/2019   INR 2.9 (H) 01/09/2009   ABG    Component Value Date/Time   PHART 7.411 02/26/2019 0311   PCO2ART 33.0 02/26/2019 0311   PO2ART 80.8 (L) 02/26/2019 0311   HCO3 20.6 02/26/2019 0311   TCO2 16 (L) 02/25/2019 0309   ACIDBASEDEF 3.3 (H) 02/26/2019 0311   O2SAT 95.9 02/26/2019 0311   CBG (last 3)  Recent Labs    02/27/19 2018 02/28/19 0015 02/28/19 0419  GLUCAP 151* 139* 157*    CC time 31 minutes  Chesley Mires, MD [facility] Pulmonary/Critical Care 02/28/2019, 7:15 AM

## 2019-02-28 NOTE — Progress Notes (Signed)
CRITICAL VALUE ALERT  Critical Value:  K 2.9  Date & Time Notied:  02/28/2019 @ 5885  Provider Notified: Warren Lacy  Orders Received/Actions taken: new orders

## 2019-02-28 NOTE — Progress Notes (Signed)
[name] NEPHROLOGY PROGRESS NOTE  Assessment/ Plan: Pt is a 73 y.o. yo female with history of hypertension, hypothyroidism, liver cirrhosis, CKD stage IV with baseline creatinine around 2.5-3 follows with Dr.Webb at [facility], admitted with confusion.  #Acute encephalopathy: Likely due to hepatic encephalopathy and sepsis.  Currently on antibiotics, lactulose per PCCM.  Do not think it is due to uremia.  #CKD stage IV: Serum creatinine level trending down to 2.6 today, urine output is marginal.  Patient has mild hyponatremia agree with free water and receiving tube feeding.  Monitor labs.  No urgent need for dialysis this time.  Continue to monitor for renal recovery.  Given poor functional status and comorbidities, patient is a marginal candidate for long-term dialysis.  I recommend palliative care consult.  #Severe sepsis with MSSA bacteremia: Cefazolin, echo with no vegetation   #Liver cirrhosis: Per PCCM.  #Anemia:: Iron saturation low but unable to give IV iron because of sepsis.  Monitor CBC, transfusion as needed.    #Thrombocytopenia likely due to liver disease and severe sepsis.Schistocytes are reported in CBC.  Adamts13 activity 22.1 %. Seen by hematologist.  #Hypokalemia: Monitor labs.  Potassium repleted.  #Lactic acidosis: Due to sepsis, management as above.   Subjective: Seen and examined in ICU.  No new event.  Patient's daughter at bedside.  Patient is alert awake.  On tube feed.    Objective Vital signs in last 24 hours: Vitals:   02/28/19 0600 02/28/19 0700 02/28/19 0757 02/28/19 0800  BP: (!) 115/58 98/60  106/61  Pulse: 94 90  91  Resp: (!) 21 16  (!) 21  Temp:   97.6 F (36.4 C)   TempSrc:   Axillary   SpO2: 100% 98%  98%  Weight:      Height:       Weight change: 1.8 kg  Intake/Output Summary (Last 24 hours) at 02/28/2019 0856 Last data filed at 02/28/2019 0800 Gross per 24 hour  Intake 3437.7 ml  Output 300 ml  Net 3137.7 ml        Labs: Basic Metabolic Panel: Recent Labs  Lab 02/26/19 0556  02/27/19 0330 02/27/19 2005 02/28/19 0522  NA 141  --  144  --  149*  K 3.3*  --  3.6  --  2.9*  CL 107  --  112*  --  115*  CO2 17*  --  20*  --  22  GLUCOSE 93  --  146*  --  160*  BUN 38*  --  48*  --  56*  CREATININE 2.84*  --  3.03*  --  2.64*  CALCIUM 7.8*  --  8.1*  --  8.5*  PHOS  --    < > 2.8 2.2* 2.6   < > = values in this interval not displayed.   Liver Function Tests: Recent Labs  Lab 03/14/2019 2019  02/25/19 0724 02/26/19 0556 02/27/19 0330 02/28/19 0522  AST 68*  --   --  287* 215*  --   ALT 35  --   --  81* 57*  --   ALKPHOS 112  --   --  124 140*  --   BILITOT 3.2*  --  2.9* 2.9* 2.9*  --   PROT 5.2*  --   --  4.9* 5.1*  --   ALBUMIN 2.8*   < >  --  2.4* 2.6* 2.7*   < > = values in this interval not displayed.   No results  for input(s): LIPASE, AMYLASE in the last 168 hours. Recent Labs  Lab 02/25/19 0231 02/26/19 0556 02/27/19 0330  AMMONIA 72* 50* 55*   CBC: Recent Labs  Lab 03/06/2019 1512 02/23/2019 2019  02/25/19 0231  02/26/19 0556 02/27/19 0330 02/28/19 0522  WBC 14.4* 15.3*  --  16.5*  --  13.5* 20.6* 21.1*  NEUTROABS 11.4* 11.5*  --   --   --   --   --   --   HGB 8.4* 8.1*   < > 7.2*   < > 7.9* 8.3* 7.3*  HCT 25.8* 24.0*   < > 21.9*   < > 22.5* 24.4* 22.8*  MCV 129.0* 129.0*  --  126.6*  --  125.0* 128.4* 134.1*  PLT PLATELET CLUMPS NOTED ON SMEAR, COUNT APPEARS DECREASED 53*  --  60*  --  36* 42* 28*   < > = values in this interval not displayed.   Cardiac Enzymes: Recent Labs  Lab 03/13/2019 1512  TROPONINI 0.04*   CBG: Recent Labs  Lab 02/27/19 1536 02/27/19 2018 02/28/19 0015 02/28/19 0419 02/28/19 0755  GLUCAP 141* 151* 139* 157* 129*    Iron Studies:  No results for input(s): IRON, TIBC, TRANSFERRIN, FERRITIN in the last 72 hours. Studies/Results: No results found.  Medications: Infusions: .  ceFAZolin (ANCEF) IV Stopped (02/27/19 2141)   . dextrose 5 % and 0.45% NaCl 50 mL/hr at 02/28/19 0815  . feeding supplement (JEVITY 1.2 CAL) 55 mL/hr at 02/28/19 0400    Scheduled Medications: . chlorhexidine  15 mL Mouth Rinse BID  . Chlorhexidine Gluconate Cloth  6 each Topical Q0600  . free water  200 mL Per Tube Q6H  . lactulose  30 g Per Tube BID  . levothyroxine  37.5 mcg Per Tube Daily  . mouth rinse  15 mL Mouth Rinse q12n4p  . pantoprazole sodium  40 mg Per Tube Q24H  . potassium chloride  40 mEq Per Tube Q4H  . rifaximin  550 mg Per Tube BID    have reviewed scheduled and prn medications.  Physical Exam: General: Ill fragile elderly looking female lying in bed, not in distress Heart:RRR, s1s2 nl, no rubs Lungs: Clear bilateral, no wheezing Abdomen:soft, Non-tender, bowel sounds positive Extremities: No edema Neurology: Encephalopathic  [name] [name] 02/28/2019,8:56 AM  LOS: 4 days

## 2019-02-28 NOTE — Progress Notes (Signed)
Called [facility] and expressed concern per report from off going shift that pt has progressively become more lethargic throughout the day. Upon assessment pt responds to pain. Pt maintaining oxygen saturations on HFNC with O2 sats of 95% with rhonchi/dminished breath sounds bilaterally. RR mid 20's.  New orders received. Will continue to monitor.

## 2019-03-01 DIAGNOSIS — G9341 Metabolic encephalopathy: Secondary | ICD-10-CM

## 2019-03-01 DIAGNOSIS — J9601 Acute respiratory failure with hypoxia: Secondary | ICD-10-CM

## 2019-03-01 LAB — POCT I-STAT 7, (LYTES, BLD GAS, ICA,H+H)
Acid-base deficit: 3 mmol/L — ABNORMAL HIGH (ref 0.0–2.0)
Bicarbonate: 23.5 mmol/L (ref 20.0–28.0)
CALCIUM ION: 1.28 mmol/L (ref 1.15–1.40)
HCT: 25 % — ABNORMAL LOW (ref 36.0–46.0)
Hemoglobin: 8.5 g/dL — ABNORMAL LOW (ref 12.0–15.0)
O2 SAT: 88 %
PO2 ART: 60 mmHg — AB (ref 83.0–108.0)
Patient temperature: 98.7
Potassium: 4.5 mmol/L (ref 3.5–5.1)
Sodium: 151 mmol/L — ABNORMAL HIGH (ref 135–145)
TCO2: 25 mmol/L (ref 22–32)
pCO2 arterial: 45.9 mmHg (ref 32.0–48.0)
pH, Arterial: 7.318 — ABNORMAL LOW (ref 7.350–7.450)

## 2019-03-01 LAB — BASIC METABOLIC PANEL
Anion gap: 7 (ref 5–15)
BUN: 64 mg/dL — ABNORMAL HIGH (ref 8–23)
CO2: 21 mmol/L — ABNORMAL LOW (ref 22–32)
Calcium: 8.3 mg/dL — ABNORMAL LOW (ref 8.9–10.3)
Chloride: 121 mmol/L — ABNORMAL HIGH (ref 98–111)
Creatinine, Ser: 2.78 mg/dL — ABNORMAL HIGH (ref 0.44–1.00)
GFR calc Af Amer: 19 mL/min — ABNORMAL LOW (ref >60)
GFR calc non Af Amer: 16 mL/min — ABNORMAL LOW (ref >60)
Glucose, Bld: 150 mg/dL — ABNORMAL HIGH (ref 70–99)
Potassium: 4.4 mmol/L (ref 3.5–5.1)
Sodium: 149 mmol/L — ABNORMAL HIGH (ref 135–145)

## 2019-03-01 LAB — CBC
HCT: 23.2 % — ABNORMAL LOW (ref 36.0–46.0)
Hemoglobin: 7.5 g/dL — ABNORMAL LOW (ref 12.0–15.0)
MCH: 43.1 pg — ABNORMAL HIGH (ref 26.0–34.0)
MCHC: 32.3 g/dL (ref 30.0–36.0)
MCV: 133.3 fL — ABNORMAL HIGH (ref 80.0–100.0)
Platelets: 24 10*3/uL — CL (ref 150–400)
RBC: 1.74 MIL/uL — ABNORMAL LOW (ref 3.87–5.11)
RDW: 28.8 % — ABNORMAL HIGH (ref 11.5–15.5)
WBC: 27.4 10*3/uL — ABNORMAL HIGH (ref 4.0–10.5)
nRBC: 2.3 % — ABNORMAL HIGH (ref 0.0–0.2)

## 2019-03-01 LAB — GLUCOSE, CAPILLARY
GLUCOSE-CAPILLARY: 137 mg/dL — AB (ref 70–99)
Glucose-Capillary: 130 mg/dL — ABNORMAL HIGH (ref 70–99)
Glucose-Capillary: 134 mg/dL — ABNORMAL HIGH (ref 70–99)
Glucose-Capillary: 140 mg/dL — ABNORMAL HIGH (ref 70–99)
Glucose-Capillary: 152 mg/dL — ABNORMAL HIGH (ref 70–99)
Glucose-Capillary: 146 mg/dL — ABNORMAL HIGH (ref 70–99)

## 2019-03-01 LAB — MAGNESIUM: Magnesium: 2.4 mg/dL (ref 1.7–2.4)

## 2019-03-01 NOTE — Progress Notes (Signed)
NAME:  [name], MRN:  [id number], DOB:  [birth year], LOS: 5 ADMISSION DATE:  02/23/2019, CONSULTATION DATE:  02/25/19 REFERRING MD:  Alfredia Ferguson  CHIEF COMPLAINT:  AMS   Brief History   73 yo female presented with altered mental status and elevated ammonia level and lactic acidosis.  Found to have liver cirrhosis and possible bowel ischemia.  Had progressive lactic acidosis and altered mental status and transferred to ICU.  Past Medical History  HTN, liver disease, hypothyroidism, CKD IV, Anemia.  Significant Hospital Events   3/3 > admit. 3/4 > transfer to ICU. 3//8 > mental status worse  Consults:  Nephrology ID Hematology  Procedures:  TTE 3/05 >> EF greater that 63%, mod RV systolic dysfx, no vegetations  Significant Diagnostic Tests:  CT head 3/3 > left sided cochlear implant. CT MXLFCL 3/3 > changes of parotitis on the right without visible abscess. RUQ [place] 3/3 > cirrhosis with ascites.  Biliary sludge without signs of acute cholecystitis. CT A / P 3/4 > hepatic cirrhosis with moderate ascites, colonic wall thickening of the ascending colon.  Air in stool throughout possibly due to colonic pneumatosis and suspicious for ischemia.  Pelvic floor descent with rectocele. CT right femur 3/4 > neg.  Micro Data:  Blood 3/3 > Staph aureus Urine 3/3 > E coli  Antimicrobials:  Vanc 3/4 > 3/4 Cefepime 3/4 > 3/4  Flagyl 3/4 > 3/4 Ancef 3/4 >   Interim history/subjective:  More lethargic.  Gurgling respirations.  Objective:  Blood pressure (!) 96/53, pulse 84, temperature (!) 97.5 F (36.4 C), temperature source Oral, resp. rate 19, height $RemoveBe'5\' 1"'[id number]$  (1.549 m), weight 58.6 kg, SpO2 99 %.        Intake/Output Summary (Last 24 hours) at 03/01/2019 0801 Last data filed at 03/01/2019 0600 Gross per 24 hour  Intake 2668.17 ml  Output 130 ml  Net 2538.17 ml   Filed Weights   02/27/19 0421 02/28/19 0440 03/01/19 0355  Weight: 55.3 kg 57.1 kg 58.6 kg    Examination:  General -  unresponsive Eyes - pupils mid point ENT - gurgling Cardiac - regular rate/rhythm, no murmur Chest - decreased BS, b/l rhonchi Abdomen - soft, non tender, decreased bowel sounds Extremities - decreased muscle bulk Skin - no rashes Neuro - not following commands  CXR (reviewed by me) - ATX   Assessment & Plan:   Acute hepatic encephalopathy in setting of sepsis. Cirrhosis with ascites. Discussion: Mental status progressively worse. Plan - previously followed by Dr. Collene Mares with GI - continue lactulose, rifazamine - if family decides to continue aggressive therapy, then will need CT head to further assess mental status change  Severe sepsis with MSSA bacteremia. Plan - ABx per ID  Acute hypoxic respiratory failure. Compromised airway. Discussion: As mental status gets worse she is having more trouble maintaining airway. Plan - oxygen to keep SpO2 90 to 95% - family deciding about whether she should be candidate for intubation; explained that she might not recover to the point of extubation given current status  CKD 4. Lactic acidosis >> improved. Hypernatremia. Hypokalemia, hypomagnesemia. Discussion: Decreasing urine outpt. Plan - concerned she is heading toward renal replacement if family wishes to continue aggressive therapy; don't see how she would be candidate for intermittent HD given her overall prognosis  Anemia of critical illness, iron deficiency and chronic disease. Thrombocytopenia, most likely from sepsis and cirrhosis. Plan - f/u CBC - transfuse for Hb < 7, PLT < 10, or bleeding  Hx of  hypothyroidism. Plan - continue synthroid  Hypoglycemia. Moderate protein calorie malnutrition. Plan - continue tube feeds  Hx HTN. Plan - Hold preadmission HCTZ, amlodipine.  Goals of care. Plan - had lengthy d/w pt's daughter on 3/08 - she would like to d/w her brother and then notify medical team about goals of care  Best Practice:  Diet: tube feeds DVT  prophylaxis: SCD's. GI prophylaxis: protonix Mobility: Bedrest. Code Status: Full. Family Communication: updated daughter at bedside Disposition: ICU.  Labs    CMP Latest Ref Rng & Units 03/01/2019 02/28/2019 02/27/2019  Glucose 70 - 99 mg/dL 150(H) 160(H) 146(H)  BUN 8 - 23 mg/dL 64(H) 56(H) 48(H)  Creatinine 0.44 - 1.00 mg/dL 2.78(H) 2.64(H) 3.03(H)  Sodium 135 - 145 mmol/L 149(H) 149(H) 144  Potassium 3.5 - 5.1 mmol/L 4.4 2.9(L) 3.6  Chloride 98 - 111 mmol/L 121(H) 115(H) 112(H)  CO2 22 - 32 mmol/L 21(L) 22 20(L)  Calcium 8.9 - 10.3 mg/dL 8.3(L) 8.5(L) 8.1(L)  Total Protein 6.5 - 8.1 g/dL - - 5.1(L)  Total Bilirubin 0.3 - 1.2 mg/dL - - 2.9(H)  Alkaline Phos 38 - 126 U/L - - 140(H)  AST 15 - 41 U/L - - 215(H)  ALT 0 - 44 U/L - - 57(H)   CBC Latest Ref Rng & Units 03/01/2019 02/28/2019 02/27/2019  WBC 4.0 - 10.5 K/uL 27.4(H) 21.1(H) 20.6(H)  Hemoglobin 12.0 - 15.0 g/dL 7.5(L) 7.3(L) 8.3(L)  Hematocrit 36.0 - 46.0 % 23.2(L) 22.8(L) 24.4(L)  Platelets 150 - 400 K/uL 24(LL) 28(LL) 42(L)   Lab Results  Component Value Date   INR 1.8 (H) 02/26/2019   INR 1.9 (H) 02/25/2019   INR 2.9 (H) 01/09/2009   ABG    Component Value Date/Time   PHART 7.380 02/28/2019 2015   PCO2ART 42.2 02/28/2019 2015   PO2ART 61.3 (L) 02/28/2019 2015   HCO3 24.5 02/28/2019 2015   TCO2 16 (L) 02/25/2019 0309   ACIDBASEDEF 0.0 02/28/2019 2015   O2SAT 90.7 02/28/2019 2015   CBG (last 3)  Recent Labs    02/28/19 2005 03/01/19 0011 03/01/19 0407  GLUCAP 124* 137* 130*    CC time 33 minutes  Chesley Mires, MD [facility] Pulmonary/Critical Care 03/01/2019, 8:01 AM

## 2019-03-01 NOTE — Progress Notes (Signed)
[facility] NEPHROLOGY PROGRESS NOTE  Assessment/ Plan: Pt is a 73 y.o. yo female with history of hypertension, hypothyroidism, liver cirrhosis, CKD stage IV with baseline creatinine around 2.5-3 follows with Dr.Webb at [facility], admitted with confusion.  #Acute encephalopathy: Likely due to hepatic encephalopathy and sepsis.  Currently on antibiotics, lactulose per PCCM.  Do not think it is due to uremia.  No improvement in mental status.  #CKD stage IV: Serum creatinine level stable at 2.78 however patient is oliguric.  She is on tube feeding and receiving IV fluid for mild hyponatremia.  With bilateral renal cortical atrophy, no hydronephrosis.  Continue Foley catheter for strict ins and out.  If no improvement in renal function she may be heading towards requiring dialysis.  Given poor functional status, encephalopathy and comorbidities, I think patient is a poor candidate for long-term dialysis.  No urgent indication for dialysis today.  I have discussed this with the patient's son and daughter. I recommend palliative care consult.  #Severe sepsis with MSSA bacteremia: Cefazolin, echo with no vegetation   #Liver cirrhosis: Per PCCM.  #Anemia:: Iron saturation low but unable to give IV iron because of sepsis.  Monitor CBC, transfusion as needed.    #Thrombocytopenia likely due to liver disease and severe sepsis.Schistocytes are reported in CBC.  Adamts13 activity 22.1 %. Seen by hematologist.  #Hypokalemia: Monitor labs.  Potassium repleted.  #Lactic acidosis: Due to sepsis, management as above.  Discussed with Dr. Halford Chessman from PCCM  Subjective: Seen and examined in ICU.  No new event, remains encephalopathy.  Very minimal urine output however creatinine level is stable.  Both son and daughter at bedside.    Objective Vital signs in last 24 hours: Vitals:   03/01/19 0700 03/01/19 0800 03/01/19 0803 03/01/19 0900  BP: (!) 96/53 109/72  (!) 94/45  Pulse: 84 90  78   Resp: 19 (!) 23  20  Temp:   (!) 97.2 F (36.2 C)   TempSrc:   Axillary   SpO2: 99% 98%  98%  Weight:      Height:       Weight change: 1.5 kg  Intake/Output Summary (Last 24 hours) at 03/01/2019 0955 Last data filed at 03/01/2019 0900 Gross per 24 hour  Intake 3130.58 ml  Output 140 ml  Net 2990.58 ml       Labs: Basic Metabolic Panel: Recent Labs  Lab 02/27/19 0330 02/27/19 2005 02/28/19 0522 03/01/19 0417  NA 144  --  149* 149*  K 3.6  --  2.9* 4.4  CL 112*  --  115* 121*  CO2 20*  --  22 21*  GLUCOSE 146*  --  160* 150*  BUN 48*  --  56* 64*  CREATININE 3.03*  --  2.64* 2.78*  CALCIUM 8.1*  --  8.5* 8.3*  PHOS 2.8 2.2* 2.6  --    Liver Function Tests: Recent Labs  Lab 03/15/2019 2019  02/25/19 0724 02/26/19 0556 02/27/19 0330 02/28/19 0522  AST 68*  --   --  287* 215*  --   ALT 35  --   --  81* 57*  --   ALKPHOS 112  --   --  124 140*  --   BILITOT 3.2*  --  2.9* 2.9* 2.9*  --   PROT 5.2*  --   --  4.9* 5.1*  --   ALBUMIN 2.8*   < >  --  2.4* 2.6* 2.7*   < > = values  in this interval not displayed.   No results for input(s): LIPASE, AMYLASE in the last 168 hours. Recent Labs  Lab 02/25/19 0231 02/26/19 0556 02/27/19 0330  AMMONIA 72* 50* 55*   CBC: Recent Labs  Lab 03/18/2019 1512 02/22/2019 2019  02/25/19 0231  02/26/19 0556 02/27/19 0330 02/28/19 0522 03/01/19 0417  WBC 14.4* 15.3*  --  16.5*  --  13.5* 20.6* 21.1* 27.4*  NEUTROABS 11.4* 11.5*  --   --   --   --   --   --   --   HGB 8.4* 8.1*   < > 7.2*   < > 7.9* 8.3* 7.3* 7.5*  HCT 25.8* 24.0*   < > 21.9*   < > 22.5* 24.4* 22.8* 23.2*  MCV 129.0* 129.0*  --  126.6*  --  125.0* 128.4* 134.1* 133.3*  PLT PLATELET CLUMPS NOTED ON SMEAR, COUNT APPEARS DECREASED 53*  --  60*  --  36* 42* 28* 24*   < > = values in this interval not displayed.   Cardiac Enzymes: Recent Labs  Lab 03/02/2019 1512  TROPONINI 0.04*   CBG: Recent Labs  Lab 02/28/19 1557 02/28/19 2005 03/01/19 0011  03/01/19 0407 03/01/19 0801  GLUCAP 102* 124* 137* 130* 134*    Iron Studies:  No results for input(s): IRON, TIBC, TRANSFERRIN, FERRITIN in the last 72 hours. Studies/Results: Dg Chest Port 1 View  Result Date: 02/28/2019 CLINICAL DATA:  Aspiration into airway. EXAM: PORTABLE CHEST 1 VIEW COMPARISON:  02/26/2019 and 02/28/2019 FINDINGS: Nasogastric tube extends into the abdomen but the tip is beyond the image. Again noted are low lung volumes with hazy densities at the left lung base. Few linear densities in the right upper lung could represent scarring or atelectasis. Heart size is stable. Atherosclerotic calcifications at the aortic arch. Negative for a pneumothorax. Gas-filled loops of bowel in the upper abdomen. IMPRESSION: Persistent bibasilar chest densities, left side greater than right. Findings are suggestive for atelectasis but could represent aspiration based on history. Minimal change from the most recent examination. Electronically Signed   By: Markus Daft M.D.   On: 02/28/2019 12:03    Medications: Infusions: .  ceFAZolin (ANCEF) IV Stopped (02/28/19 2139)  . dextrose 5 % and 0.45% NaCl 50 mL/hr at 03/01/19 0600  . feeding supplement (JEVITY 1.2 CAL) 55 mL/hr at 02/28/19 1800    Scheduled Medications: . chlorhexidine  15 mL Mouth Rinse BID  . Chlorhexidine Gluconate Cloth  6 each Topical Q0600  . free water  200 mL Per Tube Q6H  . lactulose  30 g Per Tube BID  . levothyroxine  37.5 mcg Per Tube Daily  . mouth rinse  15 mL Mouth Rinse q12n4p  . pantoprazole sodium  40 mg Per Tube Q24H  . rifaximin  550 mg Per Tube BID    have reviewed scheduled and prn medications.  Physical Exam: General: Ill fragile elderly looking female lying in bed, not in distress Heart:RRR, s1s2 nl, no rubs Lungs: Bibasal reduced breath sound, no wheezing or crackles appreciated Abdomen:soft, Non-tender, bowel sounds positive Extremities: No lower extremity edema Neurology:  Encephalopathic  Dron Prasad Bhandari 03/01/2019,9:55 AM  LOS: 5 days

## 2019-03-01 NOTE — Progress Notes (Signed)
[facility] Physician-Brief Progress Note Patient Name: [name] DOB: Sep 07, 1946 MRN: [id number]   Date of Service  03/01/2019  HPI/Events of Note  Increased lethargy - Blood glucose = 140's-150's.  eICU Interventions  Will order: 1. ABG STAT.      Intervention Category Major Interventions: Change in mental status - evaluation and management  [name] 03/01/2019, 11:02 PM

## 2019-03-02 ENCOUNTER — Inpatient Hospital Stay (HOSPITAL_COMMUNITY): Payer: Medicare Other

## 2019-03-02 DIAGNOSIS — J9621 Acute and chronic respiratory failure with hypoxia: Secondary | ICD-10-CM

## 2019-03-02 DIAGNOSIS — J9690 Respiratory failure, unspecified, unspecified whether with hypoxia or hypercapnia: Secondary | ICD-10-CM

## 2019-03-02 DIAGNOSIS — Z9911 Dependence on respirator [ventilator] status: Secondary | ICD-10-CM

## 2019-03-02 DIAGNOSIS — R4182 Altered mental status, unspecified: Secondary | ICD-10-CM

## 2019-03-02 DIAGNOSIS — T17908A Unspecified foreign body in respiratory tract, part unspecified causing other injury, initial encounter: Secondary | ICD-10-CM

## 2019-03-02 DIAGNOSIS — Z7189 Other specified counseling: Secondary | ICD-10-CM

## 2019-03-02 DIAGNOSIS — J9601 Acute respiratory failure with hypoxia: Secondary | ICD-10-CM

## 2019-03-02 LAB — CBC
HCT: 20.9 % — ABNORMAL LOW (ref 36.0–46.0)
HCT: 26.6 % — ABNORMAL LOW (ref 36.0–46.0)
HEMATOCRIT: 21 % — AB (ref 36.0–46.0)
Hemoglobin: 6.8 g/dL — CL (ref 12.0–15.0)
Hemoglobin: 6.8 g/dL — CL (ref 12.0–15.0)
Hemoglobin: 8.7 g/dL — ABNORMAL LOW (ref 12.0–15.0)
MCH: 44.4 pg — ABNORMAL HIGH (ref 26.0–34.0)
MCH: 44.4 pg — AB (ref 26.0–34.0)
MCH: 36.9 pg — AB (ref 26.0–34.0)
MCHC: 32.5 g/dL (ref 30.0–36.0)
MCHC: 32.4 g/dL (ref 30.0–36.0)
MCHC: 32.7 g/dL (ref 30.0–36.0)
MCV: 136.6 fL — ABNORMAL HIGH (ref 80.0–100.0)
MCV: 137.3 fL — ABNORMAL HIGH (ref 80.0–100.0)
MCV: 112.7 fL — ABNORMAL HIGH (ref 80.0–100.0)
Platelets: 28 10*3/uL — CL (ref 150–400)
Platelets: 36 10*3/uL — ABNORMAL LOW (ref 150–400)
Platelets: 25 10*3/uL — CL (ref 150–400)
RBC: 1.53 MIL/uL — ABNORMAL LOW (ref 3.87–5.11)
RBC: 1.53 MIL/uL — ABNORMAL LOW (ref 3.87–5.11)
RBC: 2.36 MIL/uL — ABNORMAL LOW (ref 3.87–5.11)
RDW: 29.2 % — ABNORMAL HIGH (ref 11.5–15.5)
RDW: 29.5 % — ABNORMAL HIGH (ref 11.5–15.5)
RDW: 35.2 % — ABNORMAL HIGH (ref 11.5–15.5)
WBC: 31.4 10*3/uL — ABNORMAL HIGH (ref 4.0–10.5)
WBC: 31.8 10*3/uL — ABNORMAL HIGH (ref 4.0–10.5)
WBC: 39.1 10*3/uL — ABNORMAL HIGH (ref 4.0–10.5)
nRBC: 4.9 % — ABNORMAL HIGH (ref 0.0–0.2)
nRBC: 5.5 % — ABNORMAL HIGH (ref 0.0–0.2)
nRBC: 9.7 % — ABNORMAL HIGH (ref 0.0–0.2)

## 2019-03-02 LAB — POCT I-STAT 7, (LYTES, BLD GAS, ICA,H+H)
Acid-Base Excess: 2 mmol/L (ref 0.0–2.0)
BICARBONATE: 28.5 mmol/L — AB (ref 20.0–28.0)
CALCIUM ION: 1.21 mmol/L (ref 1.15–1.40)
HCT: 22 % — ABNORMAL LOW (ref 36.0–46.0)
Hemoglobin: 7.5 g/dL — ABNORMAL LOW (ref 12.0–15.0)
O2 Saturation: 100 %
PO2 ART: 247 mmHg — AB (ref 83.0–108.0)
Patient temperature: 97.6
Potassium: 6.4 mmol/L (ref 3.5–5.1)
Sodium: 149 mmol/L — ABNORMAL HIGH (ref 135–145)
TCO2: 30 mmol/L (ref 22–32)
pCO2 arterial: 53.4 mmHg — ABNORMAL HIGH (ref 32.0–48.0)
pH, Arterial: 7.332 — ABNORMAL LOW (ref 7.350–7.450)

## 2019-03-02 LAB — PREPARE RBC (CROSSMATCH)

## 2019-03-02 LAB — COMPREHENSIVE METABOLIC PANEL
ALT: <5 U/L (ref 0–44)
ANION GAP: 5 (ref 5–15)
AST: 70 U/L — ABNORMAL HIGH (ref 15–41)
Albumin: 2 g/dL — ABNORMAL LOW (ref 3.5–5.0)
Alkaline Phosphatase: 145 U/L — ABNORMAL HIGH (ref 38–126)
BUN: 72 mg/dL — ABNORMAL HIGH (ref 8–23)
CO2: 26 mmol/L (ref 22–32)
Calcium: 8.3 mg/dL — ABNORMAL LOW (ref 8.9–10.3)
Chloride: 118 mmol/L — ABNORMAL HIGH (ref 98–111)
Creatinine, Ser: 3.01 mg/dL — ABNORMAL HIGH (ref 0.44–1.00)
GFR calc Af Amer: 17 mL/min — ABNORMAL LOW (ref >60)
GFR calc non Af Amer: 15 mL/min — ABNORMAL LOW (ref >60)
GLUCOSE: 118 mg/dL — AB (ref 70–99)
Potassium: 4.9 mmol/L (ref 3.5–5.1)
SODIUM: 149 mmol/L — AB (ref 135–145)
Total Bilirubin: 1.9 mg/dL — ABNORMAL HIGH (ref 0.3–1.2)
Total Protein: 4.1 g/dL — ABNORMAL LOW (ref 6.5–8.1)

## 2019-03-02 LAB — BASIC METABOLIC PANEL
ANION GAP: 10 (ref 5–15)
BUN: 77 mg/dL — ABNORMAL HIGH (ref 8–23)
CO2: 17 mmol/L — ABNORMAL LOW (ref 22–32)
Calcium: 8.3 mg/dL — ABNORMAL LOW (ref 8.9–10.3)
Chloride: 119 mmol/L — ABNORMAL HIGH (ref 98–111)
Creatinine, Ser: 3.43 mg/dL — ABNORMAL HIGH (ref 0.44–1.00)
GFR calc Af Amer: 14 mL/min — ABNORMAL LOW (ref >60)
GFR calc non Af Amer: 12 mL/min — ABNORMAL LOW (ref >60)
Glucose, Bld: 113 mg/dL — ABNORMAL HIGH (ref 70–99)
Potassium: 4.9 mmol/L (ref 3.5–5.1)
SODIUM: 146 mmol/L — AB (ref 135–145)

## 2019-03-02 LAB — ABO/RH: ABO/RH(D): O POS

## 2019-03-02 LAB — GLUCOSE, CAPILLARY
GLUCOSE-CAPILLARY: 100 mg/dL — AB (ref 70–99)
Glucose-Capillary: 101 mg/dL — ABNORMAL HIGH (ref 70–99)
Glucose-Capillary: 103 mg/dL — ABNORMAL HIGH (ref 70–99)
Glucose-Capillary: 136 mg/dL — ABNORMAL HIGH (ref 70–99)
Glucose-Capillary: 148 mg/dL — ABNORMAL HIGH (ref 70–99)
Glucose-Capillary: 74 mg/dL (ref 70–99)

## 2019-03-02 LAB — AMMONIA: AMMONIA: 66 umol/L — AB (ref 9–35)

## 2019-03-02 MED ORDER — CHLORHEXIDINE GLUCONATE 0.12% ORAL RINSE (MEDLINE KIT)
15.0000 mL | Freq: Two times a day (BID) | OROMUCOSAL | Status: DC
  Administered 2019-03-02 – 2019-03-05 (×7): 15 mL via OROMUCOSAL

## 2019-03-02 MED ORDER — DOCUSATE SODIUM 50 MG/5ML PO LIQD: 100.0000 mg | Freq: Two times a day (BID) | ORAL | Status: DC | PRN

## 2019-03-02 MED ORDER — SODIUM CHLORIDE 0.9 % IV SOLN: INTRAVENOUS | Status: DC | PRN

## 2019-03-02 MED ORDER — PANTOPRAZOLE SODIUM 40 MG IV SOLR
40.0000 mg | INTRAVENOUS | Status: DC
  Administered 2019-03-02 – 2019-03-06 (×5): 40 mg via INTRAVENOUS
  Filled 2019-03-02 (×5): qty 40

## 2019-03-02 MED ORDER — ORAL CARE MOUTH RINSE
15.0000 mL | OROMUCOSAL | Status: DC
  Administered 2019-03-02 – 2019-03-05 (×32): 15 mL via OROMUCOSAL

## 2019-03-02 MED ORDER — VASOPRESSIN 20 UNIT/ML IV SOLN
0.0300 [IU]/min | INTRAVENOUS | Status: DC
  Administered 2019-03-02 – 2019-03-06 (×5): 0.03 [IU]/min via INTRAVENOUS
  Filled 2019-03-02 (×5): qty 2

## 2019-03-02 MED ORDER — NOREPINEPHRINE 4 MG/250ML-% IV SOLN
0.0000 ug/min | INTRAVENOUS | Status: DC
  Administered 2019-03-02: 20 ug/min via INTRAVENOUS
  Administered 2019-03-02: 10 ug/min via INTRAVENOUS
  Filled 2019-03-02 (×2): qty 250

## 2019-03-02 MED ORDER — ALBUMIN HUMAN 25 % IV SOLN
12.5000 g | Freq: Once | INTRAVENOUS | Status: AC
  Administered 2019-03-02: 12.5 g via INTRAVENOUS
  Filled 2019-03-02: qty 50

## 2019-03-02 MED ORDER — "THROMBI-PAD 3""X3"" EX PADS"
1.0000 | MEDICATED_PAD | Freq: Once | CUTANEOUS | Status: AC
  Administered 2019-03-02: 1 via TOPICAL
  Filled 2019-03-02: qty 1

## 2019-03-02 MED ORDER — CHLORHEXIDINE GLUCONATE CLOTH 2 % EX PADS
6.0000 | MEDICATED_PAD | Freq: Every day | CUTANEOUS | Status: DC
  Administered 2019-03-02 – 2019-03-05 (×4): 6 via TOPICAL

## 2019-03-02 MED ORDER — SODIUM CHLORIDE 0.9% IV SOLUTION
Freq: Once | INTRAVENOUS | Status: AC
  Administered 2019-03-02: 12:00:00 via INTRAVENOUS

## 2019-03-02 MED ORDER — METRONIDAZOLE IN NACL 5-0.79 MG/ML-% IV SOLN
500.0000 mg | Freq: Three times a day (TID) | INTRAVENOUS | Status: DC
  Administered 2019-03-02 – 2019-03-03 (×4): 500 mg via INTRAVENOUS
  Filled 2019-03-02 (×5): qty 100

## 2019-03-02 MED ORDER — SODIUM POLYSTYRENE SULFONATE 15 GM/60ML PO SUSP
30.0000 g | Freq: Once | ORAL | Status: DC
  Filled 2019-03-02: qty 120

## 2019-03-02 MED ORDER — FENTANYL CITRATE (PF) 100 MCG/2ML IJ SOLN
50.0000 ug | Freq: Once | INTRAMUSCULAR | Status: AC
  Administered 2019-03-02: 50 ug via INTRAVENOUS

## 2019-03-02 MED ORDER — FENTANYL 2500MCG IN NS 250ML (10MCG/ML) PREMIX INFUSION: 25.0000 ug/h | INTRAVENOUS | Status: DC

## 2019-03-02 MED ORDER — NOREPINEPHRINE 16 MG/250ML-% IV SOLN
0.0000 ug/min | INTRAVENOUS | Status: DC
  Administered 2019-03-02: 27 ug/min via INTRAVENOUS
  Administered 2019-03-02: 26 ug/min via INTRAVENOUS
  Administered 2019-03-03: 23 ug/min via INTRAVENOUS
  Administered 2019-03-03 – 2019-03-05 (×2): 10 ug/min via INTRAVENOUS
  Filled 2019-03-02 (×6): qty 250

## 2019-03-02 MED ORDER — RIFAXIMIN 200 MG PO TABS
400.0000 mg | ORAL_TABLET | Freq: Three times a day (TID) | ORAL | Status: DC
  Administered 2019-03-02 – 2019-03-05 (×9): 400 mg via ORAL
  Filled 2019-03-02 (×11): qty 2

## 2019-03-02 MED ORDER — LACTULOSE 10 GM/15ML PO SOLN
30.0000 g | Freq: Three times a day (TID) | ORAL | Status: DC
  Administered 2019-03-02 – 2019-03-05 (×9): 30 g
  Filled 2019-03-02 (×10): qty 45

## 2019-03-02 MED ORDER — SODIUM CHLORIDE 0.9% IV SOLUTION: Freq: Once | INTRAVENOUS | Status: AC

## 2019-03-02 MED ORDER — FENTANYL CITRATE (PF) 100 MCG/2ML IJ SOLN
INTRAMUSCULAR | Status: AC
  Filled 2019-03-02: qty 2

## 2019-03-02 MED ORDER — FENTANYL BOLUS VIA INFUSION
25.0000 ug | INTRAVENOUS | Status: DC | PRN
  Filled 2019-03-02: qty 25

## 2019-03-02 MED ORDER — NOREPINEPHRINE 4 MG/250ML-% IV SOLN
INTRAVENOUS | Status: AC
  Administered 2019-03-02: 10 ug/min via INTRAVENOUS
  Filled 2019-03-02: qty 250

## 2019-03-02 NOTE — Progress Notes (Signed)
Moved tube 2 CM per MD tube had slide to 22 now at 20

## 2019-03-02 NOTE — Procedures (Signed)
Central Venous Catheter Insertion Procedure Note [name] [id number] 03-18-1946  Procedure: Insertion of Central Venous Catheter Indications: Assessment of intravascular volume and Drug and/or fluid administration  Procedure Details Consent: Unable to obtain consent because of emergent medical necessity. Time Out: Verified patient identification, verified procedure, site/side was marked, verified correct patient position, special equipment/implants available, medications/allergies/relevent history reviewed, required imaging and test results available.  Performed  Maximum sterile technique was used including antiseptics, cap, gloves, gown, hand hygiene, mask and sheet. Skin prep: Chlorhexidine; local anesthetic administered A antimicrobial bonded/coated triple lumen catheter was placed in the left internal jugular vein using the Seldinger technique.  Evaluation Blood flow good Complications: No apparent complications Patient did tolerate procedure well. Chest X-ray ordered to verify placement.  CXR: normal.--> no pneumothorax. LIJ CVC in good position  [name] 03/02/2019, 4:23 AM

## 2019-03-02 NOTE — Progress Notes (Addendum)
Platelet count 25 k/ul and WBC 39  resulted to Dr Nelda Marseille

## 2019-03-02 NOTE — Progress Notes (Signed)
Pharmacy Antibiotic Note  [name] is a 73 y.o. female admitted on 02/28/2019 with MSSA bacteremia. Pharmacy has been consulted for cefazolin dosing. Currently also on metronidazole for aspiration pneumonia. WBC has trended up to 31 today. SCr worsening. Pt is oliguric.   Plan: Continue Cefazolin 2 gm every 12 hours. If no change in trajectory tomorrow, will decrease dose.  Monitor renal function, clinical progress, repeat cultures   Height: $Remove'5\' 1"'SRsZikA$  (154.9 cm) Weight: 130 lb 4.7 oz (59.1 kg) IBW/kg (Calculated) : 47.8  Temp (24hrs), Avg:98.3 F (36.8 C), Min:97.9 F (36.6 C), Max:98.6 F (37 C)  Recent Labs  Lab 02/28/2019 2224 02/25/19 0231  02/26/19 0556 02/27/19 0330 02/28/19 0522 03/01/19 0417 03/02/19 0455 03/02/19 0655  WBC  --  16.5*  --  13.5* 20.6* 21.1* 27.4* 31.4* 31.8*  CREATININE  --  2.60*   < > 2.84* 3.03* 2.64* 2.78* 3.01*  --   LATICACIDVEN 7.2* 7.6*  --  4.8* 4.9* 2.6*  --   --   --    < > = values in this interval not displayed.    Estimated Creatinine Clearance: 13.5 mL/min (A) (by C-G formula based on SCr of 3.01 mg/dL (H)).    No Known Allergies  Antimicrobials this admission: Vanc 3/3 >>3/4 Cefepime 3/4> 3/4 Metronidazole 3/4>>3/4 Cefazolin 3/4>>    Microbiology results: 3/3  BCx: 2/4 gm positive cocci (BCID- MSSA) 3/5 BCx2 >> ngtd 3/9 TA >> mod yeast    Albertina Parr, PharmD., BCPS Clinical Pharmacist Clinical phone for 03/02/19 until 3:30pm: [phone] If after 3:30pm, please refer to [facility] for unit-specific pharmacist

## 2019-03-02 NOTE — Progress Notes (Signed)
CRITICAL VALUE ALERT  Critical Value:  Hemoglobin 6.8  Date & Time Notied:  03/02/2019 @ 1025  Provider Notified: Warren Lacy MD  Orders Received/Actions taken: notified MD

## 2019-03-02 NOTE — Procedures (Signed)
Intubation Procedure Note [name] [id number] Mar 30, 1946  Procedure: Intubation Indications: Respiratory insufficiency  Procedure Details Consent: Unable to obtain consent because of emergent medical necessity. Time Out: Verified patient identification, verified procedure, site/side was marked, verified correct patient position, special equipment/implants available, medications/allergies/relevent history reviewed, required imaging and test results available.  Performed  Maximum sterile technique was used including gloves, gown, hand hygiene and mask.  MAC and 3 ETT 21 at lip size 7 cuffed   Evaluation Hemodynamic Status: Transient hypotension treated with pressors and fluid; O2 sats: currently acceptable Patient's Current Condition: stable Complications: No apparent complications Patient did tolerate procedure well. Chest X-ray ordered to verify placement.  CXR: pending.   [name] [name] 03/02/2019

## 2019-03-02 NOTE — Progress Notes (Signed)
While cleaning pt up from [facility] pt had a emesis event while rolled on her side. Pt unable to maintain airway and/or clear secretions. Pts breathing now very labored and pts respiratory status declining. [facility] called and bedside MD called for stat intubation and line placement. See flowsheet for documentation.  Pts daughter called and updated by RN.

## 2019-03-02 NOTE — Progress Notes (Signed)
Patient transported to CT ans back without complications. RN at bedside.

## 2019-03-02 NOTE — Progress Notes (Signed)
NAME:  [name], MRN:  [id number], DOB:  08-26-1946, LOS: 6 ADMISSION DATE:  03/15/2019, CONSULTATION DATE:  02/25/19 REFERRING MD:  Alfredia Ferguson  CHIEF COMPLAINT:  AMS   Brief History   73 yo female presented with altered mental status and elevated ammonia level and lactic acidosis.  Found to have liver cirrhosis and possible bowel ischemia.  Had progressive lactic acidosis and altered mental status and transferred to ICU.  Past Medical History  HTN, liver disease, hypothyroidism, CKD IV, Anemia.  Significant Hospital Events   3/3 > admit. 3/4 > transfer to ICU. 3/8 > mental status worse 3/9 intubated for inability to protect her airway  Consults:  Nephrology ID Hematology PCCM  Procedures:  TTE 3/05 >> EF greater that 84%, mod RV systolic dysfx, no vegetations  Significant Diagnostic Tests:  CT head 3/3 > left sided cochlear implant. CT MXLFCL 3/3 > changes of parotitis on the right without visible abscess. RUQ [place] 3/3 > cirrhosis with ascites.  Biliary sludge without signs of acute cholecystitis. CT A / P 3/4 > hepatic cirrhosis with moderate ascites, colonic wall thickening of the ascending colon.  Air in stool throughout possibly due to colonic pneumatosis and suspicious for ischemia.  Pelvic floor descent with rectocele. CT right femur 3/4 > neg.  Micro Data:  Blood 3/3 > Staph aureus Urine 3/3 > E coli  Antimicrobials:  Vanc 3/4 > 3/4 Cefepime 3/4 > 3/4  Flagyl 3/4 > 3/4 Ancef 3/4 >   Interim history/subjective:  Intubated overnight  Objective:  Blood pressure (!) 99/52, pulse 95, temperature 98.6 F (37 C), temperature source Axillary, resp. rate (!) 24, height $RemoveBe'5\' 1"'JFLevLAzo$  (1.549 m), weight 59.1 kg, SpO2 93 %.    Vent Mode: PRVC FiO2 (%):  [40 %-100 %] 40 % Set Rate:  [14 bmp-20 bmp] 20 bmp Vt Set:  [400 mL] 400 mL PEEP:  [5 cmH20] 5 cmH20 Plateau Pressure:  [14 cmH20] 14 cmH20   Intake/Output Summary (Last 24 hours) at 03/02/2019 0956 Last data filed at 03/02/2019  0700 Gross per 24 hour  Intake 2727.09 ml  Output 941 ml  Net 1786.09 ml   Filed Weights   03/01/19 0355 03/02/19 0000 03/02/19 0500  Weight: 58.6 kg 59.1 kg 59.1 kg    Examination:  General - Unresponsive, acute on chronically ill appearing female Eyes - PERRL, EOM-I and MMM ENT - ETT in place Cardiac - RRR, Nl S1/S2 and -M/R/G Chest - Coarse BS diffusely Abdomen - Soft, NT, ND and +BS Extremities - Emaciated, no edema and no tenderness Skin - no rashes, intact Neuro - unresponsive, not following commands  I reviewed CXR myself, ETT is in a good position.   Assessment & Plan:   Acute hepatic encephalopathy in setting of sepsis. Cirrhosis with ascites. Discussion: Mental status progressively worse. Plan - Restart rifaxamine - Increase lactulose to 30 g TID from BID - Head CT now that patient is intubated - Ammonia level in AM  Severe sepsis with MSSA bacteremia. Plan - ABx per ID  Acute hypoxic respiratory failure. Compromised airway. Discussion: As mental status gets worse she is having more trouble maintaining airway. Plan - Full vent support - No weaning give mental status  CKD 4. Lactic acidosis >> improved. Hypernatremia. Hypokalemia, hypomagnesemia. Discussion: Decreasing urine outpt. Plan - Nephrology to determine if dialysis is appropriate - Kayexalate 30 gm x1  Anemia of critical illness, iron deficiency and chronic disease. Thrombocytopenia, most likely from sepsis and cirrhosis. Plan - F/u  CBC - Transfuse for Hb < 7, PLT < 10, or bleeding  Hx of hypothyroidism. Plan - Continue synthroid  Hypoglycemia. Moderate protein calorie malnutrition. Plan - Continue tube feeds  Hx HTN. Plan - Hold preadmission HCTZ, amlodipine.  Goals of care. Plan - had lengthy d/w pt's daughter on 3/08 - she would like to d/w her brother and then notify medical team about goals of care  Best Practice:  Diet: tube feeds DVT prophylaxis: SCD's. GI  prophylaxis: protonix Mobility: Bedrest. Code Status: Full. Family Communication: updated daughter at bedside Disposition: ICU.  Labs    CMP Latest Ref Rng & Units 03/02/2019 03/02/2019 03/01/2019  Glucose 70 - 99 mg/dL - 118(H) -  BUN 8 - 23 mg/dL - 72(H) -  Creatinine 0.44 - 1.00 mg/dL - 3.01(H) -  Sodium 135 - 145 mmol/L 149(H) 149(H) 151(H)  Potassium 3.5 - 5.1 mmol/L 6.4(HH) 4.9 4.5  Chloride 98 - 111 mmol/L - 118(H) -  CO2 22 - 32 mmol/L - 26 -  Calcium 8.9 - 10.3 mg/dL - 8.3(L) -  Total Protein 6.5 - 8.1 g/dL - 4.1(L) -  Total Bilirubin 0.3 - 1.2 mg/dL - 1.9(H) -  Alkaline Phos 38 - 126 U/L - 145(H) -  AST 15 - 41 U/L - 70(H) -  ALT 0 - 44 U/L - <5 -   CBC Latest Ref Rng & Units 03/02/2019 03/02/2019 03/01/2019  WBC 4.0 - 10.5 K/uL - 31.4(H) -  Hemoglobin 12.0 - 15.0 g/dL 7.5(L) 6.8(LL) 8.5(L)  Hematocrit 36.0 - 46.0 % 22.0(L) 20.9(L) 25.0(L)  Platelets 150 - 400 K/uL - 28(LL) -   Lab Results  Component Value Date   INR 1.8 (H) 02/26/2019   INR 1.9 (H) 02/25/2019   INR 2.9 (H) 01/09/2009   ABG    Component Value Date/Time   PHART 7.332 (L) 03/02/2019 0551   PCO2ART 53.4 (H) 03/02/2019 0551   PO2ART 247.0 (H) 03/02/2019 0551   HCO3 28.5 (H) 03/02/2019 0551   TCO2 30 03/02/2019 0551   ACIDBASEDEF 3.0 (H) 03/01/2019 2215   O2SAT 100.0 03/02/2019 0551   CBG (last 3)  Recent Labs    03/01/19 1948 03/02/19 0012 03/02/19 0750  GLUCAP 146* 136* 101*   The patient is critically ill with multiple organ systems failure and requires high complexity decision making for assessment and support, frequent evaluation and titration of therapies, application of advanced monitoring technologies and extensive interpretation of multiple databases.   Critical Care Time devoted to patient care services described in this note is  32  Minutes. This time reflects time of care of this signee Dr Jennet Maduro. This critical care time does not reflect procedure time, or teaching time or  supervisory time of PA/NP/Med student/Med Resident etc but could involve care discussion time.  Rush Farmer, M.D. [facility] Pulmonary/Critical Care Medicine. Pager: [phone]. After hours pager: [phone].

## 2019-03-02 NOTE — Progress Notes (Signed)
[facility] ROUNDING NOTE   Subjective:   73 year old lady with history of hypertension hypothyroidism liver cirrhosis and chronic kidney disease stage IV baseline creatinine about 2.5-3.0 she was admitted with MSSA bacteremia and acute hypoxic respiratory failure with worsening encephalopathy,.  Her renal function appears to be relatively stable.  Creatinine was 2.8 on admission. renal ultrasound showed no hydronephrosis but bilateral renal cortical atrophy.  On 03/02/2019 she underwent intubation and placement of triple-lumen catheter.  Blood pressure 110/60 pulse 94 temperature 98.6  40% FiO2 90% oxygen sats  Urine output 175 cc 3/8 /20 weight 59.1 increased from 50.6 kg on admission  Sodium 149 potassium 6.4 chloride 118 CO2 26 BUN 73 creatinine 3.0 glucose 118 calcium 8.3 albumin 2.0  Medications Ancef 2 g every 12 hours,  metronidazole 500 mg every 8 hours, rifaximin 550 mg twice daily, Levophed titrated, Synthroid 37.5 mcg daily, tonics 40 mg daily,  CT abdomen 02/25/2019 with hepatic cirrhosis colonic wall thickening of the ascending colon advanced aortobiiliac atherosclerosis abdominal ascites.  Air noted in stool throughout possibly due to colonic pneumatosis or suspicion for ischemia CT scan of head 03/05/2019 showed left-sided cochlear implant chest x-ray 02/28/2019 persistent bilateral chest densities left greater than right    Objective:  Vital signs in last 24 hours:  Temp:  [97.9 F (36.6 C)-98.6 F (37 C)] 98.6 F (37 C) (03/09 0755) Pulse Rate:  [81-102] 95 (03/09 0702) Resp:  [16-25] 24 (03/09 0735) BP: (78-123)/(44-64) 99/52 (03/09 0730) SpO2:  [93 %-100 %] 93 % (03/09 0702) Arterial Line BP: (95-136)/(26-48) 111/44 (03/09 0735) FiO2 (%):  [40 %-100 %] 40 % (03/09 0702) Weight:  [59.1 kg] 59.1 kg (03/09 0500)  Weight change: 0.5 kg Filed Weights   03/01/19 0355 03/02/19 0000 03/02/19 0500  Weight: 58.6 kg 59.1 kg 59.1 kg    Intake/Output: I/O last 3  completed shifts: In: 4676.7 [I.V.:1768.7; NG/GT:2570; IV Piggyback:338] Out: 1056 [Urine:255; Emesis/NG output:801]   Intake/Output this shift:  No intake/output data recorded.  Frail elderly lady intubated nonresponsive CVS- RRR no rubs or gallops audible JVP not elevated RS-conical breath sounds throughout somewhat rhonchorous ABD-hypoactive bowel sounds EXT- no edema   Basic Metabolic Panel: Recent Labs  Lab 02/26/19 0556 02/26/19 1202 02/26/19 1653 02/27/19 0330 02/27/19 2005 02/28/19 0522 03/01/19 0417 03/01/19 2215 03/02/19 0455 03/02/19 0551  NA 141  --   --  144  --  149* 149* 151* 149* 149*  K 3.3*  --   --  3.6  --  2.9* 4.4 4.5 4.9 6.4*  CL 107  --   --  112*  --  115* 121*  --  118*  --   CO2 17*  --   --  20*  --  22 21*  --  26  --   GLUCOSE 93  --   --  146*  --  160* 150*  --  118*  --   BUN 38*  --   --  48*  --  56* 64*  --  72*  --   CREATININE 2.84*  --   --  3.03*  --  2.64* 2.78*  --  3.01*  --   CALCIUM 7.8*  --   --  8.1*  --  8.5* 8.3*  --  8.3*  --   MG 2.3 2.3 2.3 2.4 2.3  --  2.4  --   --   --   PHOS  --  3.7 3.4 2.8 2.2* 2.6  --   --   --   --  Liver Function Tests: Recent Labs  Lab 03/23/2019 1512 02/27/2019 2019 02/25/19 0231 02/25/19 0724 02/26/19 0556 02/27/19 0330 02/28/19 0522 03/02/19 0455  AST 64* 68*  --   --  287* 215*  --  70*  ALT 35 35  --   --  81* 57*  --  <5  ALKPHOS 114 112  --   --  124 140*  --  145*  BILITOT 3.0* 3.2*  --  2.9* 2.9* 2.9*  --  1.9*  PROT 5.5* 5.2*  --   --  4.9* 5.1*  --  4.1*  ALBUMIN 2.9* 2.8* 2.4*  --  2.4* 2.6* 2.7* 2.0*   No results for input(s): LIPASE, AMYLASE in the last 168 hours. Recent Labs  Lab 02/26/19 0556 02/27/19 0330 03/02/19 0455  AMMONIA 50* 55* 66*    CBC: Recent Labs  Lab 03/08/2019 1512 03/06/2019 2019  02/26/19 0556 02/27/19 0330 02/28/19 0522 03/01/19 0417 03/01/19 2215 03/02/19 0455 03/02/19 0551  WBC 14.4* 15.3*   < > 13.5* 20.6* 21.1* 27.4*  --  31.4*   --   NEUTROABS 11.4* 11.5*  --   --   --   --   --   --   --   --   HGB 8.4* 8.1*   < > 7.9* 8.3* 7.3* 7.5* 8.5* 6.8* 7.5*  HCT 25.8* 24.0*   < > 22.5* 24.4* 22.8* 23.2* 25.0* 20.9* 22.0*  MCV 129.0* 129.0*   < > 125.0* 128.4* 134.1* 133.3*  --  136.6*  --   PLT PLATELET CLUMPS NOTED ON SMEAR, COUNT APPEARS DECREASED 53*   < > 36* 42* 28* 24*  --  28*  --    < > = values in this interval not displayed.    Cardiac Enzymes: Recent Labs  Lab 03/09/2019 1512  TROPONINI 0.04*    BNP: Invalid input(s): POCBNP  CBG: Recent Labs  Lab 03/01/19 1135 03/01/19 1532 03/01/19 1948 03/02/19 0012 03/02/19 0750  GLUCAP 140* 152* 146* 136* 101*    Microbiology: Results for orders placed or performed during the hospital encounter of 03/06/2019  Urine culture     Status: Abnormal   Collection Time: 03/19/2019  5:04 PM  Result Value Ref Range Status   Specimen Description URINE, RANDOM  Final   Special Requests   Final    NONE Performed at [facility], [place] [address], [place],  [address]    Culture >=100,000 COLONIES/mL ESCHERICHIA COLI (A)  Final   Report Status 02/26/2019 FINAL  Final   Organism ID, Bacteria ESCHERICHIA COLI (A)  Final      Susceptibility   Escherichia coli - MIC*    AMPICILLIN >=32 RESISTANT Resistant     CEFAZOLIN >=64 RESISTANT Resistant     CEFTRIAXONE <=1 SENSITIVE Sensitive     CIPROFLOXACIN <=0.25 SENSITIVE Sensitive     GENTAMICIN <=1 SENSITIVE Sensitive     IMIPENEM <=0.25 SENSITIVE Sensitive     NITROFURANTOIN <=16 SENSITIVE Sensitive     TRIMETH/SULFA <=20 SENSITIVE Sensitive     AMPICILLIN/SULBACTAM >=32 RESISTANT Resistant     PIP/TAZO <=4 SENSITIVE Sensitive     Extended ESBL NEGATIVE Sensitive     * >=100,000 COLONIES/mL ESCHERICHIA COLI  Blood culture (routine x 2)     Status: Abnormal   Collection Time: 03/22/2019  6:20 PM  Result Value Ref Range Status   Specimen Description BLOOD LEFT ARM  Final   Special Requests   Final    BOTTLES  DRAWN AEROBIC ONLY Blood Culture adequate volume   Culture  Setup Time   Final    GRAM POSITIVE COCCI IN CLUSTERS AEROBIC BOTTLE ONLY CRITICAL VALUE NOTED.  VALUE IS CONSISTENT WITH PREVIOUSLY REPORTED AND CALLED VALUE.    Culture (A)  Final    STAPHYLOCOCCUS AUREUS SUSCEPTIBILITIES PERFORMED ON PREVIOUS CULTURE WITHIN THE LAST 5 DAYS. Performed at [facility], [place] [address] [place], [place] [address]    Report Status 02/27/2019 FINAL  Final  Blood culture (routine x 2)     Status: Abnormal   Collection Time: 03/14/2019  6:22 PM  Result Value Ref Range Status   Specimen Description BLOOD RIGHT ANTECUBITAL  Final   Special Requests   Final    BOTTLES DRAWN AEROBIC AND ANAEROBIC Blood Culture results may not be optimal due to an inadequate volume of blood received in culture bottles   Culture  Setup Time   Final    GRAM POSITIVE COCCI IN CLUSTERS IN BOTH AEROBIC AND ANAEROBIC BOTTLES CRITICAL RESULT CALLED TO, READ BACK BY AND VERIFIED WITH: PHRMD D HANNA '@[id number]'  02/25/2019 BY S GEZAHEGN Performed at [facility], [place] [address] [place], [place] [address]    Culture STAPHYLOCOCCUS AUREUS (A)  Final   Report Status 02/27/2019 FINAL  Final   Organism ID, Bacteria STAPHYLOCOCCUS AUREUS  Final      Susceptibility   Staphylococcus aureus - MIC*    CIPROFLOXACIN <=0.5 SENSITIVE Sensitive     ERYTHROMYCIN 1 INTERMEDIATE Intermediate     GENTAMICIN <=0.5 SENSITIVE Sensitive     OXACILLIN <=0.25 SENSITIVE Sensitive     TETRACYCLINE <=1 SENSITIVE Sensitive     VANCOMYCIN <=0.5 SENSITIVE Sensitive     TRIMETH/SULFA <=10 SENSITIVE Sensitive     CLINDAMYCIN <=0.25 SENSITIVE Sensitive     RIFAMPIN <=0.5 SENSITIVE Sensitive     Inducible Clindamycin NEGATIVE Sensitive     * STAPHYLOCOCCUS AUREUS  Blood Culture ID Panel (Reflexed)     Status: Abnormal   Collection Time: 03/02/2019  6:22 PM  Result Value Ref Range Status   Enterococcus species NOT DETECTED NOT DETECTED Final    Listeria monocytogenes NOT DETECTED NOT DETECTED Final   Staphylococcus species DETECTED (A) NOT DETECTED Final    Comment: CRITICAL RESULT CALLED TO, READ BACK BY AND VERIFIED WITH: PHRMD D HANNA '@[id number]'  02/25/2019 BY S GEZAHEGN    Staphylococcus aureus (BCID) DETECTED (A) NOT DETECTED Final    Comment: Methicillin (oxacillin) susceptible Staphylococcus aureus (MSSA). Preferred therapy is anti staphylococcal beta lactam antibiotic (Cefazolin or Nafcillin), unless clinically contraindicated. CRITICAL RESULT CALLED TO, READ BACK BY AND VERIFIED WITH: PHRMD D HANNA '@[id number]'  02/25/2019 BY S GEZAHEGN    Methicillin resistance NOT DETECTED NOT DETECTED Final   Streptococcus species NOT DETECTED NOT DETECTED Final   Streptococcus agalactiae NOT DETECTED NOT DETECTED Final   Streptococcus pneumoniae NOT DETECTED NOT DETECTED Final   Streptococcus pyogenes NOT DETECTED NOT DETECTED Final   Acinetobacter baumannii NOT DETECTED NOT DETECTED Final   Enterobacteriaceae species NOT DETECTED NOT DETECTED Final   Enterobacter cloacae complex NOT DETECTED NOT DETECTED Final   Escherichia coli NOT DETECTED NOT DETECTED Final   Klebsiella oxytoca NOT DETECTED NOT DETECTED Final   Klebsiella pneumoniae NOT DETECTED NOT DETECTED Final   Proteus species NOT DETECTED NOT DETECTED Final   Serratia marcescens NOT DETECTED NOT DETECTED Final   Haemophilus influenzae NOT DETECTED NOT DETECTED Final   Neisseria meningitidis NOT DETECTED NOT DETECTED Final   Pseudomonas aeruginosa  NOT DETECTED NOT DETECTED Final   Candida albicans NOT DETECTED NOT DETECTED Final   Candida glabrata NOT DETECTED NOT DETECTED Final   Candida krusei NOT DETECTED NOT DETECTED Final   Candida parapsilosis NOT DETECTED NOT DETECTED Final   Candida tropicalis NOT DETECTED NOT DETECTED Final    Comment: Performed at [facility], [place] [address], [place], [place] [address]  MRSA PCR Screening     Status: None   Collection Time: 02/25/19  12:58 AM  Result Value Ref Range Status   MRSA by PCR NEGATIVE NEGATIVE Final    Comment:        The GeneXpert MRSA Assay (FDA approved for NASAL specimens only), is one component of a comprehensive MRSA colonization surveillance program. It is not intended to diagnose MRSA infection nor to guide or monitor treatment for MRSA infections. Performed at [facility], [place] [address], [place], [place] [address]   Culture, blood (routine x 2)     Status: None (Preliminary result)   Collection Time: 02/26/19  5:56 AM  Result Value Ref Range Status   Specimen Description BLOOD LEFT ARM  Final   Special Requests   Final    BOTTLES DRAWN AEROBIC ONLY Blood Culture adequate volume   Culture   Final    NO GROWTH 4 DAYS Performed at [facility], [place] [address] [place], [place] [address]    Report Status PENDING  Incomplete  Culture, blood (routine x 2)     Status: None (Preliminary result)   Collection Time: 02/26/19  5:56 AM  Result Value Ref Range Status   Specimen Description BLOOD LEFT HAND  Final   Special Requests   Final    BOTTLES DRAWN AEROBIC ONLY Blood Culture results may not be optimal due to an inadequate volume of blood received in culture bottles   Culture   Final    NO GROWTH 4 DAYS Performed at [facility], [place] [address] [place], [place] [address]    Report Status PENDING  Incomplete  Culture, respiratory (non-expectorated)     Status: None (Preliminary result)   Collection Time: 03/02/19  3:18 AM  Result Value Ref Range Status   Specimen Description TRACHEAL ASPIRATE  Final   Special Requests NONE  Final   Gram Stain   Final    MODERATE WBC PRESENT, PREDOMINANTLY PMN MODERATE YEAST WITH PSEUDOHYPHAE Performed at [facility], [address], [place], [place] [address]    Culture PENDING  Incomplete   Report Status PENDING  Incomplete    Coagulation Studies: No results for input(s): LABPROT, INR in the last 72  hours.  Urinalysis: No results for input(s): COLORURINE, LABSPEC, PHURINE, GLUCOSEU, HGBUR, BILIRUBINUR, KETONESUR, PROTEINUR, UROBILINOGEN, NITRITE, LEUKOCYTESUR in the last 72 hours.  Invalid input(s): APPERANCEUR    Imaging: Dg Chest Port 1 View  Result Date: 03/02/2019 CLINICAL DATA:  Life support line placement. EXAM: PORTABLE CHEST 1 VIEW COMPARISON:  Chest radiograph February 28, 2019 FINDINGS: Endotracheal tube tip projects 11 mm above the carina. LEFT internal jugular central venous catheter distal tip projects in distal superior vena cava. Nasogastric tube past mid stomach, distal tip out of field of view. Cardiac silhouette is upper limits of normal in size. Calcified aortic arch. Bibasilar strandy densities. No pleural effusion focal consolidation. No pneumothorax. Soft tissue planes and included osseous structures are non suspicious. IMPRESSION: 1. Endotracheal tube tip projects 11 mm above the carina, recommend 1-2 cm retraction. LEFT internal  jugular central venous catheter distal tip projects in distal superior vena cava. Nasogastric tube past mid stomach. 2. Bibasilar atelectasis/scarring. 3.  Aortic Atherosclerosis (ICD10-I70.0). Electronically Signed   By: Elon Alas M.D.   On: 03/02/2019 04:29   Dg Chest Port 1 View  Result Date: 02/28/2019 CLINICAL DATA:  Aspiration into airway. EXAM: PORTABLE CHEST 1 VIEW COMPARISON:  02/26/2019 and 02/23/2019 FINDINGS: Nasogastric tube extends into the abdomen but the tip is beyond the image. Again noted are low lung volumes with hazy densities at the left lung base. Few linear densities in the right upper lung could represent scarring or atelectasis. Heart size is stable. Atherosclerotic calcifications at the aortic arch. Negative for a pneumothorax. Gas-filled loops of bowel in the upper abdomen. IMPRESSION: Persistent bibasilar chest densities, left side greater than right. Findings are suggestive for atelectasis but could represent  aspiration based on history. Minimal change from the most recent examination. Electronically Signed   By: Markus Daft M.D.   On: 02/28/2019 12:03     Medications:   . sodium chloride    .  ceFAZolin (ANCEF) IV 2 g (03/02/19 0921)  . dextrose 5 % and 0.45% NaCl 50 mL/hr at 03/02/19 0700  . feeding supplement (JEVITY 1.2 CAL) Stopped (03/02/19 0330)  . fentaNYL infusion INTRAVENOUS    . norepinephrine (LEVOPHED) Adult infusion 20 mcg/min (03/02/19 0904)   . chlorhexidine gluconate (MEDLINE KIT)  15 mL Mouth Rinse BID  . Chlorhexidine Gluconate Cloth  6 each Topical Q0600  . free water  200 mL Per Tube Q6H  . lactulose  30 g Per Tube BID  . levothyroxine  37.5 mcg Per Tube Daily  . mouth rinse  15 mL Mouth Rinse 10 times per day  . pantoprazole (PROTONIX) IV  40 mg Intravenous Q24H   Place/Maintain arterial line **AND** sodium chloride, acetaminophen **OR** acetaminophen, dextrose, docusate, fentaNYL, [DISCONTINUED] ondansetron **OR** ondansetron (ZOFRAN) IV  Assessment/ Plan:   Acute on chronic kidney injury stage IV baseline poor candidate for long-term dialysis.  Is been evaluated at [facility] in the past.  It appears that she has become increasingly more volume overloaded with worsening serum creatinine and oliguria.  Palliative care have been recommended.  To discuss with critical care whether to proceed with CRRT treatment.  Hyperkalemia patient now being resuscitated with IV pressors now intubated.  Would recommend treatment with insulin glucose and calcium as temporizing measures before family makes a decision regarding dialysis  Cirrhosis with ascites and encephalopathy continues on lactulose and rifaximin.  MSSA bacteremia commended to continue Ancef TEE when able.  Aspiration pneumonia now intubated Flagyl added per infectious disease  Anemia per critical care transfusion as needed  Hypothyroidism continue Synthroid 37.5 mg daily  Disposition discussion  with family considering goals of care.   LOS: [place] '@TODAY' '@9' :46 AM

## 2019-03-02 NOTE — Procedures (Signed)
Bronchoscopy Procedure Note [name] [id number] 07-18-1946  Procedure: Bronchoscopy Indications: Diagnostic evaluation of the airways, Obtain specimens for culture and/or other diagnostic studies and Remove secretions  Procedure Details Consent: Unable to obtain consent because of emergent medical necessity. Time Out: Verified patient identification, verified procedure, site/side was marked, verified correct patient position, special equipment/implants available, medications/allergies/relevent history reviewed, required imaging and test results available.  Performed  In preparation for procedure, patient was given 100% FiO2 and bronchoscope lubricated. Sedation: not requiring sedation encephalopathic  Airway entered and the following bronchi were examined: RUL, RML, RLL, LUL and LLL.   Erythematous airways noted in RML RLL suctioned moderate amount of mucous and gastric contents prior to obtaining resp cx.  Procedures performed: Brushings performed Bronchoscope removed.   Pt on FiO2 100% PEEP 5  Evaluation Hemodynamic Status: BP stable throughout; O2 sats: stable throughout Patient's Current Condition: stable Specimens:  Sent serosanguinous fluid.  Complications: No apparent complications Patient did tolerate procedure well.   [name] [name] 03/02/2019

## 2019-03-02 NOTE — Progress Notes (Signed)
[facility] Infect[facility]   Reason for visit: Follow up on MSSA bacteremia  Interval History: now intubated, sedated, WBC up to 31.4, remains afebrile.  Multiple family members at bedside.     Physical Exam: Constitutional:  Vitals:   03/02/19 0735 03/02/19 0755  BP:    Pulse:    Resp: (!) 24   Temp:  98.6 F (37 C)  SpO2:     patient is sedated Eyes: anicteric HENT: +ET Respiratory: respiratory effort on vent; + wheezes diffusely Cardiovascular: Tachy RR GI: soft, nt, nd  Review of Systems: Unable to be assessed due to patient factors  Lab Results  Component Value Date   WBC 31.4 (H) 03/02/2019   HGB 7.5 (L) 03/02/2019   HCT 22.0 (L) 03/02/2019   MCV 136.6 (H) 03/02/2019   PLT 28 (LL) 03/02/2019    Lab Results  Component Value Date   CREATININE 3.01 (H) 03/02/2019   BUN 72 (H) 03/02/2019   NA 149 (H) 03/02/2019   K 6.4 (HH) 03/02/2019   CL 118 (H) 03/02/2019   CO2 26 03/02/2019    Lab Results  Component Value Date   ALT <5 03/02/2019   AST 70 (H) 03/02/2019   ALKPHOS 145 (H) 03/02/2019     Microbiology: Recent Results (from the past 240 hour(s))  Urine culture     Status: Abnormal   Collection Time: 03/21/2019  5:04 PM  Result Value Ref Range Status   Specimen Description URINE, RANDOM  Final   Special Requests   Final    NONE Performed at [facility], [address] [place], [place] [address]    Culture >=100,000 COLONIES/mL ESCHERICHIA COLI (A)  Final   Report Status 02/26/2019 FINAL  Final   Organism ID, Bacteria ESCHERICHIA COLI (A)  Final      Susceptibility   Escherichia coli - MIC*    AMPICILLIN >=32 RESISTANT Resistant     CEFAZOLIN >=64 RESISTANT Resistant     CEFTRIAXONE <=1 SENSITIVE Sensitive     CIPROFLOXACIN <=0.25 SENSITIVE Sensitive     GENTAMICIN <=1 SENSITIVE Sensitive     IMIPENEM <=0.25 SENSITIVE Sensitive     NITROFURANTOIN <=16 SENSITIVE Sensitive     TRIMETH/SULFA <=20 SENSITIVE Sensitive    AMPICILLIN/SULBACTAM >=32 RESISTANT Resistant     PIP/TAZO <=4 SENSITIVE Sensitive     Extended ESBL NEGATIVE Sensitive     * >=100,000 COLONIES/mL ESCHERICHIA COLI  Blood culture (routine x 2)     Status: Abnormal   Collection Time: 03/02/2019  6:20 PM  Result Value Ref Range Status   Specimen Description BLOOD LEFT ARM  Final   Special Requests   Final    BOTTLES DRAWN AEROBIC ONLY Blood Culture adequate volume   Culture  Setup Time   Final    GRAM POSITIVE COCCI IN CLUSTERS AEROBIC BOTTLE ONLY CRITICAL VALUE NOTED.  VALUE IS CONSISTENT WITH PREVIOUSLY REPORTED AND CALLED VALUE.    Culture (A)  Final    STAPHYLOCOCCUS AUREUS SUSCEPTIBILITIES PERFORMED ON PREVIOUS CULTURE WITHIN THE LAST 5 DAYS. Performed at [facility], [place] [address] [place],  [address]    Report Status 02/27/2019 FINAL  Final  Blood culture (routine x 2)     Status: Abnormal   Collection Time: 03/13/2019  6:22 PM  Result Value Ref Range Status   Specimen Description BLOOD RIGHT ANTECUBITAL  Final   Special Requests   Final    BOTTLES DRAWN AEROBIC AND ANAEROBIC  Blood Culture results may not be optimal due to an inadequate volume of blood received in culture bottles   Culture  Setup Time   Final    GRAM POSITIVE COCCI IN CLUSTERS IN BOTH AEROBIC AND ANAEROBIC BOTTLES CRITICAL RESULT CALLED TO, READ BACK BY AND VERIFIED WITH: PHRMD D HANNA $Remov'@1142'[email or web address]$  02/25/2019 BY S GEZAHEGN Performed at [facility], [place] [address] [place], [place] [address]    Culture STAPHYLOCOCCUS AUREUS (A)  Final   Report Status 02/27/2019 FINAL  Final   Organism ID, Bacteria STAPHYLOCOCCUS AUREUS  Final      Susceptibility   Staphylococcus aureus - MIC*    CIPROFLOXACIN <=0.5 SENSITIVE Sensitive     ERYTHROMYCIN 1 INTERMEDIATE Intermediate     GENTAMICIN <=0.5 SENSITIVE Sensitive     OXACILLIN <=0.25 SENSITIVE Sensitive     TETRACYCLINE <=1 SENSITIVE Sensitive     VANCOMYCIN <=0.5 SENSITIVE Sensitive     TRIMETH/SULFA  <=10 SENSITIVE Sensitive     CLINDAMYCIN <=0.25 SENSITIVE Sensitive     RIFAMPIN <=0.5 SENSITIVE Sensitive     Inducible Clindamycin NEGATIVE Sensitive     * STAPHYLOCOCCUS AUREUS  Blood Culture ID Panel (Reflexed)     Status: Abnormal   Collection Time: 02/28/2019  6:22 PM  Result Value Ref Range Status   Enterococcus species NOT DETECTED NOT DETECTED Final   Listeria monocytogenes NOT DETECTED NOT DETECTED Final   Staphylococcus species DETECTED (A) NOT DETECTED Final    Comment: CRITICAL RESULT CALLED TO, READ BACK BY AND VERIFIED WITH: PHRMD D HANNA $Remov'@1142'[email or web address]$  02/25/2019 BY S GEZAHEGN    Staphylococcus aureus (BCID) DETECTED (A) NOT DETECTED Final    Comment: Methicillin (oxacillin) susceptible Staphylococcus aureus (MSSA). Preferred therapy is anti staphylococcal beta lactam antibiotic (Cefazolin or Nafcillin), unless clinically contraindicated. CRITICAL RESULT CALLED TO, READ BACK BY AND VERIFIED WITH: PHRMD D HANNA $Remov'@1142'[email or web address]$  02/25/2019 BY S GEZAHEGN    Methicillin resistance NOT DETECTED NOT DETECTED Final   Streptococcus species NOT DETECTED NOT DETECTED Final   Streptococcus agalactiae NOT DETECTED NOT DETECTED Final   Streptococcus pneumoniae NOT DETECTED NOT DETECTED Final   Streptococcus pyogenes NOT DETECTED NOT DETECTED Final   Acinetobacter baumannii NOT DETECTED NOT DETECTED Final   Enterobacteriaceae species NOT DETECTED NOT DETECTED Final   Enterobacter cloacae complex NOT DETECTED NOT DETECTED Final   Escherichia coli NOT DETECTED NOT DETECTED Final   Klebsiella oxytoca NOT DETECTED NOT DETECTED Final   Klebsiella pneumoniae NOT DETECTED NOT DETECTED Final   Proteus species NOT DETECTED NOT DETECTED Final   Serratia marcescens NOT DETECTED NOT DETECTED Final   Haemophilus influenzae NOT DETECTED NOT DETECTED Final   Neisseria meningitidis NOT DETECTED NOT DETECTED Final   Pseudomonas aeruginosa NOT DETECTED NOT DETECTED Final   Candida albicans NOT DETECTED NOT DETECTED Final     Candida glabrata NOT DETECTED NOT DETECTED Final   Candida krusei NOT DETECTED NOT DETECTED Final   Candida parapsilosis NOT DETECTED NOT DETECTED Final   Candida tropicalis NOT DETECTED NOT DETECTED Final    Comment: Performed at [facility], [place] [address], [place], [place] [address]  MRSA PCR Screening     Status: None   Collection Time: 02/25/19 12:58 AM  Result Value Ref Range Status   MRSA by PCR NEGATIVE NEGATIVE Final    Comment:        The GeneXpert MRSA Assay (FDA approved for NASAL specimens only), is one component of a comprehensive MRSA colonization surveillance program. It is not  intended to diagnose MRSA infection nor to guide or monitor treatment for MRSA infections. Performed at [facility], [place] [address] [place], [place] [address]   Culture, blood (routine x 2)     Status: None (Preliminary result)   Collection Time: 02/26/19  5:56 AM  Result Value Ref Range Status   Specimen Description BLOOD LEFT ARM  Final   Special Requests   Final    BOTTLES DRAWN AEROBIC ONLY Blood Culture adequate volume   Culture   Final    NO GROWTH 4 DAYS Performed at [facility], [place] [address], [place], [place] [address]    Report Status PENDING  Incomplete  Culture, blood (routine x 2)     Status: None (Preliminary result)   Collection Time: 02/26/19  5:56 AM  Result Value Ref Range Status   Specimen Description BLOOD LEFT HAND  Final   Special Requests   Final    BOTTLES DRAWN AEROBIC ONLY Blood Culture results may not be optimal due to an inadequate volume of blood received in culture bottles   Culture   Final    NO GROWTH 4 DAYS Performed at [facility], [place] [address] [place], [place] [address]    Report Status PENDING  Incomplete  Culture, respiratory (non-expectorated)     Status: None (Preliminary result)   Collection Time: 03/02/19  3:18 AM  Result Value Ref Range Status   Specimen Description TRACHEAL ASPIRATE  Final   Special  Requests NONE  Final   Gram Stain   Final    MODERATE WBC PRESENT, PREDOMINANTLY PMN MODERATE YEAST WITH PSEUDOHYPHAE Performed at [facility], [address], [place], [place] [address]    Culture PENDING  Incomplete   Report Status PENDING  Incomplete    Impression/Plan:  1. MSSA bacteremia - continues on cefazolin.   TEE when able Repeat cultures ngtd  2.  Aspiration - possible pneumonia and now intubated.  Will add Flagyl.  3.  Respiratory failure - now intubated.  On pressor support as well.   Discussed with family at bedside

## 2019-03-02 NOTE — Progress Notes (Signed)
Called and spoke to [facility] concerning increasing lethargy and ability to protect airway. On assessment pt only responds to pain and remains on HFNC with bilateral rhonchi/dimished breath sounds.  New orders received. Will continue to monitor.

## 2019-03-02 NOTE — Procedures (Signed)
Arterial Catheter Insertion Procedure Note [name] [id number] [birth year]  Procedure: Insertion of Arterial Catheter  Indications: Blood pressure monitoring and Frequent blood sampling  Procedure Details Consent: Unable to obtain consent because of altered level of consciousness. Time Out: Verified patient identification, verified procedure, site/side was marked, verified correct patient position, special equipment/implants available, medications/allergies/relevent history reviewed, required imaging and test results available.  Performed  Maximum sterile technique was used including cap, gloves, gown and hand hygiene. Skin prep: Chlorhexidine; local anesthetic administered 20 gauge catheter was inserted into left radial artery using the Seldinger technique. ULTRASOUND GUIDANCE USED: NO Evaluation Blood flow good; BP tracing good. Complications: No apparent complications.   Wyman Songster [facility] 03/02/2019

## 2019-03-03 ENCOUNTER — Inpatient Hospital Stay (HOSPITAL_COMMUNITY): Payer: Medicare Other

## 2019-03-03 DIAGNOSIS — K7211 Chronic hepatic failure with coma: Secondary | ICD-10-CM

## 2019-03-03 DIAGNOSIS — N179 Acute kidney failure, unspecified: Secondary | ICD-10-CM

## 2019-03-03 LAB — BASIC METABOLIC PANEL
Anion gap: 10 (ref 5–15)
BUN: 81 mg/dL — AB (ref 8–23)
CO2: 17 mmol/L — ABNORMAL LOW (ref 22–32)
Calcium: 8.2 mg/dL — ABNORMAL LOW (ref 8.9–10.3)
Chloride: 119 mmol/L — ABNORMAL HIGH (ref 98–111)
Creatinine, Ser: 3.77 mg/dL — ABNORMAL HIGH (ref 0.44–1.00)
GFR calc Af Amer: 13 mL/min — ABNORMAL LOW (ref >60)
GFR calc non Af Amer: 11 mL/min — ABNORMAL LOW (ref >60)
Glucose, Bld: 175 mg/dL — ABNORMAL HIGH (ref 70–99)
POTASSIUM: 4.8 mmol/L (ref 3.5–5.1)
Sodium: 146 mmol/L — ABNORMAL HIGH (ref 135–145)

## 2019-03-03 LAB — CBC
HCT: 25.5 % — ABNORMAL LOW (ref 36.0–46.0)
Hemoglobin: 8.3 g/dL — ABNORMAL LOW (ref 12.0–15.0)
MCH: 37.4 pg — AB (ref 26.0–34.0)
MCHC: 32.5 g/dL (ref 30.0–36.0)
MCV: 114.9 fL — ABNORMAL HIGH (ref 80.0–100.0)
Platelets: 25 10*3/uL — CL (ref 150–400)
RBC: 2.22 MIL/uL — ABNORMAL LOW (ref 3.87–5.11)
RDW: 36.6 % — ABNORMAL HIGH (ref 11.5–15.5)
WBC: 40.3 10*3/uL — ABNORMAL HIGH (ref 4.0–10.5)
nRBC: 9.2 % — ABNORMAL HIGH (ref 0.0–0.2)

## 2019-03-03 LAB — POCT I-STAT 7, (LYTES, BLD GAS, ICA,H+H)
Acid-base deficit: 6 mmol/L — ABNORMAL HIGH (ref 0.0–2.0)
Acid-base deficit: 7 mmol/L — ABNORMAL HIGH (ref 0.0–2.0)
Bicarbonate: 19.6 mmol/L — ABNORMAL LOW (ref 20.0–28.0)
Bicarbonate: 18 mmol/L — ABNORMAL LOW (ref 20.0–28.0)
Calcium, Ion: 1.19 mmol/L (ref 1.15–1.40)
Calcium, Ion: 1.08 mmol/L — ABNORMAL LOW (ref 1.15–1.40)
HCT: 28 % — ABNORMAL LOW (ref 36.0–46.0)
HCT: 23 % — ABNORMAL LOW (ref 36.0–46.0)
Hemoglobin: 9.5 g/dL — ABNORMAL LOW (ref 12.0–15.0)
Hemoglobin: 7.8 g/dL — ABNORMAL LOW (ref 12.0–15.0)
O2 Saturation: 98 %
O2 Saturation: 99 %
PH ART: 7.331 — AB (ref 7.350–7.450)
PO2 ART: 118 mmHg — AB (ref 83.0–108.0)
Patient temperature: 99.2
Patient temperature: 98
Potassium: 5.4 mmol/L — ABNORMAL HIGH (ref 3.5–5.1)
Potassium: 5.6 mmol/L — ABNORMAL HIGH (ref 3.5–5.1)
Sodium: 148 mmol/L — ABNORMAL HIGH (ref 135–145)
Sodium: 148 mmol/L — ABNORMAL HIGH (ref 135–145)
TCO2: 21 mmol/L — ABNORMAL LOW (ref 22–32)
TCO2: 19 mmol/L — ABNORMAL LOW (ref 22–32)
pCO2 arterial: 38.5 mmHg (ref 32.0–48.0)
pCO2 arterial: 33.9 mmHg (ref 32.0–48.0)
pH, Arterial: 7.316 — ABNORMAL LOW (ref 7.350–7.450)
pO2, Arterial: 132 mmHg — ABNORMAL HIGH (ref 83.0–108.0)

## 2019-03-03 LAB — PHOSPHORUS: Phosphorus: 4.1 mg/dL (ref 2.5–4.6)

## 2019-03-03 LAB — GLUCOSE, CAPILLARY
Glucose-Capillary: 153 mg/dL — ABNORMAL HIGH (ref 70–99)
Glucose-Capillary: 183 mg/dL — ABNORMAL HIGH (ref 70–99)
Glucose-Capillary: 199 mg/dL — ABNORMAL HIGH (ref 70–99)
Glucose-Capillary: 172 mg/dL — ABNORMAL HIGH (ref 70–99)
Glucose-Capillary: 150 mg/dL — ABNORMAL HIGH (ref 70–99)

## 2019-03-03 LAB — LACTIC ACID, PLASMA: Lactic Acid, Venous: 4.1 mmol/L (ref 0.5–1.9)

## 2019-03-03 LAB — CULTURE, BLOOD (ROUTINE X 2)
Culture: NO GROWTH
Culture: NO GROWTH
Special Requests: ADEQUATE

## 2019-03-03 LAB — AMMONIA: Ammonia: 43 umol/L — ABNORMAL HIGH (ref 9–35)

## 2019-03-03 LAB — MAGNESIUM: Magnesium: 2.3 mg/dL (ref 1.7–2.4)

## 2019-03-03 LAB — PATHOLOGIST SMEAR REVIEW

## 2019-03-03 MED ORDER — SODIUM ZIRCONIUM CYCLOSILICATE 5 G PO PACK
5.0000 g | PACK | Freq: Once | ORAL | Status: AC
  Administered 2019-03-03: 5 g via ORAL
  Filled 2019-03-03: qty 1

## 2019-03-03 MED ORDER — FENTANYL CITRATE (PF) 100 MCG/2ML IJ SOLN
25.0000 ug | INTRAMUSCULAR | Status: DC | PRN
  Administered 2019-03-03: 50 ug via INTRAVENOUS
  Administered 2019-03-03: 25 ug via INTRAVENOUS
  Administered 2019-03-03 – 2019-03-04 (×5): 50 ug via INTRAVENOUS
  Administered 2019-03-05: 25 ug via INTRAVENOUS
  Administered 2019-03-05: 50 ug via INTRAVENOUS
  Administered 2019-03-05 (×2): 25 ug via INTRAVENOUS
  Administered 2019-03-05: 50 ug via INTRAVENOUS
  Administered 2019-03-06: 25 ug via INTRAVENOUS
  Filled 2019-03-03 (×11): qty 2

## 2019-03-03 MED ORDER — FENTANYL CITRATE (PF) 100 MCG/2ML IJ SOLN
INTRAMUSCULAR | Status: AC
  Administered 2019-03-03: 50 ug via INTRAVENOUS
  Filled 2019-03-03: qty 2

## 2019-03-03 MED ORDER — GERHARDT'S BUTT CREAM
TOPICAL_CREAM | Freq: Three times a day (TID) | CUTANEOUS | Status: DC | PRN
  Administered 2019-03-03 (×2): via TOPICAL
  Administered 2019-03-04: 1 via TOPICAL
  Administered 2019-03-04: 17:00:00 via TOPICAL
  Filled 2019-03-03: qty 1

## 2019-03-03 MED ORDER — SODIUM CHLORIDE 0.9 % IV SOLN
1.0000 g | INTRAVENOUS | Status: DC
  Administered 2019-03-03: 1 g via INTRAVENOUS
  Filled 2019-03-03 (×2): qty 1

## 2019-03-03 MED ORDER — SODIUM BICARBONATE 650 MG PO TABS
650.0000 mg | ORAL_TABLET | Freq: Three times a day (TID) | ORAL | Status: DC
  Administered 2019-03-03 – 2019-03-04 (×4): 650 mg
  Filled 2019-03-03 (×4): qty 1

## 2019-03-03 MED ORDER — INSULIN ASPART 100 UNIT/ML ~~LOC~~ SOLN
0.0000 [IU] | SUBCUTANEOUS | Status: DC
  Administered 2019-03-03 (×2): 2 [IU] via SUBCUTANEOUS
  Administered 2019-03-03: 1 [IU] via SUBCUTANEOUS
  Administered 2019-03-04 (×3): 2 [IU] via SUBCUTANEOUS
  Administered 2019-03-05: 3 [IU] via SUBCUTANEOUS
  Administered 2019-03-05: 1 [IU] via SUBCUTANEOUS

## 2019-03-03 MED ORDER — DEXTROSE 5 % IV SOLN
INTRAVENOUS | Status: DC
  Administered 2019-03-03 – 2019-03-06 (×5): via INTRAVENOUS

## 2019-03-03 NOTE — Progress Notes (Signed)
[facility] for Infectious Disease   Reason for visit: Follow up on MSSA bacteremia  Interval History: remains intubated, sedated, WBC up to 40.3, remains afebrile.  Family members at bedside.     Physical Exam: Constitutional:  Vitals:   03/03/19 0945 03/03/19 1000  BP:  (!) 110/57  Pulse: 86 76  Resp: 14 17  Temp:    SpO2: 100% 100%   patient is sedated Eyes: anicteric HENT: +ET Respiratory: respiratory effort on vent; decreased wheezes Cardiovascular: Tachy RR GI: soft, nt, nd  Review of Systems: Unable to be assessed due to patient factors  Lab Results  Component Value Date   WBC 40.3 (H) 03/03/2019   HGB 7.8 (L) 03/03/2019   HCT 23.0 (L) 03/03/2019   MCV 114.9 (H) 03/03/2019   PLT 25 (LL) 03/03/2019    Lab Results  Component Value Date   CREATININE 3.77 (H) 03/03/2019   BUN 81 (H) 03/03/2019   NA 148 (H) 03/03/2019   K 5.6 (H) 03/03/2019   CL 119 (H) 03/03/2019   CO2 17 (L) 03/03/2019    Lab Results  Component Value Date   ALT <5 03/02/2019   AST 70 (H) 03/02/2019   ALKPHOS 145 (H) 03/02/2019     Microbiology: Recent Results (from the past 240 hour(s))  Urine culture     Status: Abnormal   Collection Time: 03/23/2019  5:04 PM  Result Value Ref Range Status   Specimen Description URINE, RANDOM  Final   Special Requests   Final    NONE Performed at [facility], [address] [place], [place] [address]    Culture >=100,000 COLONIES/mL ESCHERICHIA COLI (A)  Final   Report Status 02/26/2019 FINAL  Final   Organism ID, Bacteria ESCHERICHIA COLI (A)  Final      Susceptibility   Escherichia coli - MIC*    AMPICILLIN >=32 RESISTANT Resistant     CEFAZOLIN >=64 RESISTANT Resistant     CEFTRIAXONE <=1 SENSITIVE Sensitive     CIPROFLOXACIN <=0.25 SENSITIVE Sensitive     GENTAMICIN <=1 SENSITIVE Sensitive     IMIPENEM <=0.25 SENSITIVE Sensitive     NITROFURANTOIN <=16 SENSITIVE Sensitive     TRIMETH/SULFA <=20 SENSITIVE Sensitive    AMPICILLIN/SULBACTAM >=32 RESISTANT Resistant     PIP/TAZO <=4 SENSITIVE Sensitive     Extended ESBL NEGATIVE Sensitive     * >=100,000 COLONIES/mL ESCHERICHIA COLI  Blood culture (routine x 2)     Status: Abnormal   Collection Time: 03/21/2019  6:20 PM  Result Value Ref Range Status   Specimen Description BLOOD LEFT ARM  Final   Special Requests   Final    BOTTLES DRAWN AEROBIC ONLY Blood Culture adequate volume   Culture  Setup Time   Final    GRAM POSITIVE COCCI IN CLUSTERS AEROBIC BOTTLE ONLY CRITICAL VALUE NOTED.  VALUE IS CONSISTENT WITH PREVIOUSLY REPORTED AND CALLED VALUE.    Culture (A)  Final    STAPHYLOCOCCUS AUREUS SUSCEPTIBILITIES PERFORMED ON PREVIOUS CULTURE WITHIN THE LAST 5 DAYS. Performed at [facility], [place] [address] [place], [place] [address]    Report Status 02/27/2019 FINAL  Final  Blood culture (routine x 2)     Status: Abnormal   Collection Time: 03/09/2019  6:22 PM  Result Value Ref Range Status   Specimen Description BLOOD RIGHT ANTECUBITAL  Final   Special Requests   Final    BOTTLES DRAWN AEROBIC AND ANAEROBIC Blood Culture results may  not be optimal due to an inadequate volume of blood received in culture bottles   Culture  Setup Time   Final    GRAM POSITIVE COCCI IN CLUSTERS IN BOTH AEROBIC AND ANAEROBIC BOTTLES CRITICAL RESULT CALLED TO, READ BACK BY AND VERIFIED WITH: PHRMD D HANNA $Remov'@1142'[email or web address]$  02/25/2019 BY S GEZAHEGN Performed at [facility], [place] [address] [place], [place] [address]    Culture STAPHYLOCOCCUS AUREUS (A)  Final   Report Status 02/27/2019 FINAL  Final   Organism ID, Bacteria STAPHYLOCOCCUS AUREUS  Final      Susceptibility   Staphylococcus aureus - MIC*    CIPROFLOXACIN <=0.5 SENSITIVE Sensitive     ERYTHROMYCIN 1 INTERMEDIATE Intermediate     GENTAMICIN <=0.5 SENSITIVE Sensitive     OXACILLIN <=0.25 SENSITIVE Sensitive     TETRACYCLINE <=1 SENSITIVE Sensitive     VANCOMYCIN <=0.5 SENSITIVE Sensitive     TRIMETH/SULFA  <=10 SENSITIVE Sensitive     CLINDAMYCIN <=0.25 SENSITIVE Sensitive     RIFAMPIN <=0.5 SENSITIVE Sensitive     Inducible Clindamycin NEGATIVE Sensitive     * STAPHYLOCOCCUS AUREUS  Blood Culture ID Panel (Reflexed)     Status: Abnormal   Collection Time: 03/14/2019  6:22 PM  Result Value Ref Range Status   Enterococcus species NOT DETECTED NOT DETECTED Final   Listeria monocytogenes NOT DETECTED NOT DETECTED Final   Staphylococcus species DETECTED (A) NOT DETECTED Final    Comment: CRITICAL RESULT CALLED TO, READ BACK BY AND VERIFIED WITH: PHRMD D HANNA $Remov'@1142'[email or web address]$  02/25/2019 BY S GEZAHEGN    Staphylococcus aureus (BCID) DETECTED (A) NOT DETECTED Final    Comment: Methicillin (oxacillin) susceptible Staphylococcus aureus (MSSA). Preferred therapy is anti staphylococcal beta lactam antibiotic (Cefazolin or Nafcillin), unless clinically contraindicated. CRITICAL RESULT CALLED TO, READ BACK BY AND VERIFIED WITH: PHRMD D HANNA $Remov'@1142'[id number]$  02/25/2019 BY S GEZAHEGN    Methicillin resistance NOT DETECTED NOT DETECTED Final   Streptococcus species NOT DETECTED NOT DETECTED Final   Streptococcus agalactiae NOT DETECTED NOT DETECTED Final   Streptococcus pneumoniae NOT DETECTED NOT DETECTED Final   Streptococcus pyogenes NOT DETECTED NOT DETECTED Final   Acinetobacter baumannii NOT DETECTED NOT DETECTED Final   Enterobacteriaceae species NOT DETECTED NOT DETECTED Final   Enterobacter cloacae complex NOT DETECTED NOT DETECTED Final   Escherichia coli NOT DETECTED NOT DETECTED Final   Klebsiella oxytoca NOT DETECTED NOT DETECTED Final   Klebsiella pneumoniae NOT DETECTED NOT DETECTED Final   Proteus species NOT DETECTED NOT DETECTED Final   Serratia marcescens NOT DETECTED NOT DETECTED Final   Haemophilus influenzae NOT DETECTED NOT DETECTED Final   Neisseria meningitidis NOT DETECTED NOT DETECTED Final   Pseudomonas aeruginosa NOT DETECTED NOT DETECTED Final   Candida albicans NOT DETECTED NOT DETECTED Final     Candida glabrata NOT DETECTED NOT DETECTED Final   Candida krusei NOT DETECTED NOT DETECTED Final   Candida parapsilosis NOT DETECTED NOT DETECTED Final   Candida tropicalis NOT DETECTED NOT DETECTED Final    Comment: Performed at [facility], [place] [address], [place], [place] [address]  MRSA PCR Screening     Status: None   Collection Time: 02/25/19 12:58 AM  Result Value Ref Range Status   MRSA by PCR NEGATIVE NEGATIVE Final    Comment:        The GeneXpert MRSA Assay (FDA approved for NASAL specimens only), is one component of a comprehensive MRSA colonization surveillance program. It is not intended to diagnose MRSA  infection nor to guide or monitor treatment for MRSA infections. Performed at [facility], [place] [address] [place], [place] [address]   Culture, blood (routine x 2)     Status: None   Collection Time: 02/26/19  5:56 AM  Result Value Ref Range Status   Specimen Description BLOOD LEFT ARM  Final   Special Requests   Final    BOTTLES DRAWN AEROBIC ONLY Blood Culture adequate volume   Culture   Final    NO GROWTH 5 DAYS Performed at [facility], [place] [address] [place], [place] [address]    Report Status 03/03/2019 FINAL  Final  Culture, blood (routine x 2)     Status: None   Collection Time: 02/26/19  5:56 AM  Result Value Ref Range Status   Specimen Description BLOOD LEFT HAND  Final   Special Requests   Final    BOTTLES DRAWN AEROBIC ONLY Blood Culture results may not be optimal due to an inadequate volume of blood received in culture bottles   Culture   Final    NO GROWTH 5 DAYS Performed at [facility], [place] [address], [place], [place] [address]    Report Status 03/03/2019 FINAL  Final  Culture, respiratory (non-expectorated)     Status: None (Preliminary result)   Collection Time: 03/02/19  3:18 AM  Result Value Ref Range Status   Specimen Description TRACHEAL ASPIRATE  Final   Special Requests NONE  Final   Gram Stain    Final    MODERATE WBC PRESENT, PREDOMINANTLY PMN MODERATE YEAST WITH PSEUDOHYPHAE Performed at [facility], [place] [address], [place], [place] [address]    Culture PENDING  Incomplete   Report Status PENDING  Incomplete    Impression/Plan:  1. MSSA bacteremia - continues on cefazolin.   TEE when able Repeat cultures ngtd  2.  Aspiration - possible pneumonia; on Flagyl  3.  Respiratory failure - now intubated.  Remains on pressor support  Discussed with family at bedside

## 2019-03-03 NOTE — Progress Notes (Signed)
Lactic Acid 4.1 reported to Marni Griffon NP

## 2019-03-03 NOTE — Progress Notes (Addendum)
NAME:  [name], MRN:  [id number], DOB:  March 29, 1946, LOS: 7 ADMISSION DATE:  03/05/2019, CONSULTATION DATE:  02/25/19 REFERRING MD:  Alfredia Ferguson  CHIEF COMPLAINT:  AMS   Brief History   73 yo female presented with altered mental status and elevated ammonia level and lactic acidosis.  Found to have liver cirrhosis and possible bowel ischemia.  Had progressive lactic acidosis and altered mental status and transferred to ICU.  Past Medical History  HTN, liver disease, hypothyroidism, CKD IV, Anemia.  Significant Hospital Events   3/3 > admit. 3/4 > transfer to ICU. 3/8 > mental status worse 3/9 intubated for inability to protect her airway 3/10 seen by nephrology.  Renal function continues to decline.  Acid-base worse.  Not HD candidate. Added cefepime for PNA. Spoke w/ family. DNR if arrests. Cont medical management for now.  Consults:  Nephrology ID Hematology PCCM  Procedures:  TTE 3/05 >> EF greater that 26%, mod RV systolic dysfx, no vegetations  Significant Diagnostic Tests:  CT head 3/3 > left sided cochlear implant. CT MXLFCL 3/3 > changes of parotitis on the right without visible abscess. RUQ [place] 3/3 > cirrhosis with ascites.  Biliary sludge without signs of acute cholecystitis. CT A / P 3/4 > hepatic cirrhosis with moderate ascites, colonic wall thickening of the ascending colon.  Air in stool throughout possibly due to colonic pneumatosis and suspicious for ischemia.  Pelvic floor descent with rectocele. CT right femur 3/4 > neg.  Micro Data:  Blood 3/3 > Staph aureus Urine 3/3 > E coli  Antimicrobials:  Vanc 3/4 > 3/4 Cefepime 3/4 > 3/4  Flagyl 3/4 > 3/4 Ancef 3/4 > 3/10 Cefepime 3/10>>  Interim history/subjective:   Still pressor dependent  Objective:  Blood pressure (Abnormal) 110/57, pulse 76, temperature 98.9 F (37.2 C), temperature source Oral, resp. rate 17, height $RemoveBe'5\' 1"'KaknWSgDD$  (1.549 m), weight 62.8 kg, SpO2 100 %.    Vent Mode: PRVC FiO2 (%):  [40 %-100 %] 80  % Set Rate:  [20 bmp] 20 bmp Vt Set:  [400 mL] 400 mL PEEP:  [5 cmH20] 5 cmH20 Plateau Pressure:  [14 cmH20-19 cmH20] 15 cmH20   Intake/Output Summary (Last 24 hours) at 03/03/2019 1044 Last data filed at 03/03/2019 1000 Gross per 24 hour  Intake 3295.66 ml  Output 1030 ml  Net 2265.66 ml   Filed Weights   03/02/19 0000 03/02/19 0500 03/03/19 0447  Weight: 59.1 kg 59.1 kg 62.8 kg    Examination:  General: This is an acute on chronically ill appearing 73 year old female she is currently unresponsive but apparently was interactive earlier this morning HEENT normocephalic atraumatic no jugular venous distention orally intubated Pulmonary: Coarse, equal chest rise on mechanical ventilation Cardiac: Regular rate and rhythm Abdomen: Soft, no tenderness, hypoactive bowel sounds Extremities: Diffuse anasarca weeping skin cool to touch Neuro: Currently unresponsive GU: Concentrated minimal urine output  Resolved issues  Lactic acidosis  Assessment & Plan:   Acute hepatic encephalopathy in setting of sepsis. Cirrhosis with ascites. Mental status progressively worse. Plan Continuing rifaximin mean, and scheduled lactulose Trend ammonia level Supportive care  Severe sepsis/septic shock  with MSSA bacteremia and new aspiration PNA (3/9) Continues to have significantly elevated white blood cell count Plan Changed to cefepime 3/10 to cover for aspiration and bacteremia Titrate pressors for MAP > 65 TEE recommended to be done when able, not sure that this is something we will pursue given declining renal function and metabolic status  Acute hypoxic respiratory failure  secondary to compromised airway.. Further complicated by concern about possible aspiration PNA -wbc ct  Remains high Plan Continuing full ventilatory support VAP bundle See above abx  Acute on chronic renal failure with history of CKD 4, appears to be headed towards end-stage renal disease Plan Continuing  supportive care Deemed not a dialysis candidate with no plan to escalate the CRRT  Fluid and electrolyte imbalance: Hypernatremia, hyperchloremia,Non-gap metabolic acidosis, hyperkalemia Plan Continue to trend chemistries Bicarb via tube Free water replacement w/ D5W @ 30 ml/hr   Anemia of critical illness, iron deficiency and chronic disease. Thrombocytopenia, most likely from sepsis and cirrhosis. Plan Transfuse for hemoglobin less than 7 or platelets less than 10 Trend CBC  Hx of hypothyroidism. Plan Continue Synthroid  Hypoglycemia, now hyperglycemia Moderate protein calorie malnutrition. Plan Continue tube feeds  Hx HTN. Plan Holding hydrochlorothiazide and amlodipine  Goals of care. Plan Do Not resuscitate  Best Practice:  Diet: tube feeds DVT prophylaxis: SCD's. GI prophylaxis: protonix Mobility: Bedrest. Code Status: Full.-->DNR  Family Communication: updated daughter at bedside Disposition: ICU.  Remains critically ill due to hypoxic respiratory failure, septic shock, bacteremia, aspiration pneumonia, acute on chronic renal failure, and decompensated liver failure.  Little to offer at this point, hopefully her shock state is being driven by new aspiration however it could simply be multiorgan failure in general.  Rewind antibiotics, will continue supportive care.  She will continue to require titration of ventilatory support, pressor support, adding bicarbonate via tube, and continuing aggressive care.  Had a long discussion with family.  She has now a DO NOT RESUSCITATE however we are continuing supportive care as outlined above  Critical care time is 45 minutes  Erick Colace ACNP-BC [place] Pager # [phone] OR # [phone] if no answer  Attending Note:  73 year old female with MODS who is not improving and if anything is worsening.  On exam, she is completely unresponsive with coarse BS diffusely.  I reviewed CXR myself, ETT is in a  good position and infiltrate noted.  Discussed with PCCM-NP.  Will not escalate care at this point.  Patient is a full DNR.  Will allow time for more family discussion and time for them to come to terms with the fact that the patient is dying.  Will continue supportive care for now.  The patient is critically ill with multiple organ systems failure and requires high complexity decision making for assessment and support, frequent evaluation and titration of therapies, application of advanced monitoring technologies and extensive interpretation of multiple databases.   Critical Care Time devoted to patient care services described in this note is  33  Minutes. This time reflects time of care of this signee Dr Jennet Maduro. This critical care time does not reflect procedure time, or teaching time or supervisory time of PA/NP/Med student/Med Resident etc but could involve care discussion time.  Rush Farmer, M.D. [facility] Pulmonary/Critical Care Medicine. Pager: [phone]. After hours pager: [phone].

## 2019-03-03 NOTE — Progress Notes (Signed)
Pt had an acute desaturation with O2 sats down to low 80's. Pt briefly recovered with several "O2 breaths but would slowly drop her O2 sat." Minimal secretions from ETT inline suction. RT called to bedside. FIO2 increased to 100 and [facility] notified. Orders for stat chest xray and ABG done.   Pt now with sats of 100%. Will continue to monitor.

## 2019-03-03 NOTE — Progress Notes (Signed)
[facility] ROUNDING NOTE   Subjective:   73 year old lady with history of hypertension hypothyroidism liver cirrhosis and chronic kidney disease stage IV baseline creatinine about 2.5-3.0 she was admitted with MSSA bacteremia and acute hypoxic respiratory failure with worsening encephalopathy,.  Her renal function appears to be relatively stable.  Creatinine was 2.8 on admission. renal ultrasound showed no hydronephrosis but bilateral renal cortical atrophy.  On 03/02/2019 she underwent intubation and placement of triple-lumen catheter.  Discussion with daughter at bedside patient is known to me from my outpatient practice and we have discussed dialysis.  She is not a candidate for dialysis and does not want to have dialysis.    Blood pressure 115/61 pulse 86 temperature 98.9 O2 sats 100% 80% FiO2  Oliguric urine output 100 cc.  NG output 1551 cc.  Weight 62.8 kg.  Transfused 1 unit packed red blood cells 03/02/2019  Sodium 146 potassium 4.8 chloride 119 CO2 at 17 BUN 81 creatinine 3.77 glucose 175 calcium 8.2 phosphorus 4.1 magnesium 2.3 ammonia 43 WBC 40.3 hemoglobin 8.3 and platelets 25  Medications Ancef 2 g every 12 hours,  metronidazole 500 mg every 8 hours, rifaximin 550 mg twice daily, Levophed titrated between 0 and 40 mcg/min.,  Vasopressin 0.03 units/min, Synthroid 37.5 mcg daily, Protonix 40 mg daily, lactulose 30cc 3 times daily  CT abdomen 02/25/2019 with hepatic cirrhosis colonic wall thickening of the ascending colon advanced aortobiiliac atherosclerosis abdominal ascites.  Air noted in stool throughout possibly due to colonic pneumatosis or suspicion for ischemia  CT scan of head 03/02/2019 straight cath artifact from left cochlear implant and enlarged right parotid gland no acute intracranial abnormality    Objective:  Vital signs in last 24 hours:  Temp:  [98.9 F (37.2 C)-100.6 F (38.1 C)] 98.9 F (37.2 C) (03/10 0718) Pulse Rate:  [60-105] 86 (03/10  0800) Resp:  [15-28] 17 (03/10 0800) BP: (80-143)/(43-74) 115/61 (03/10 0800) SpO2:  [58 %-100 %] 100 % (03/10 0800) Arterial Line BP: (68-142)/(35-54) 118/42 (03/10 0800) FiO2 (%):  [40 %-100 %] 80 % (03/10 0731) Weight:  [62.8 kg] 62.8 kg (03/10 0447)  Weight change: 3.7 kg Filed Weights   03/02/19 0000 03/02/19 0500 03/03/19 0447  Weight: 59.1 kg 59.1 kg 62.8 kg    Intake/Output: I/O last 3 completed shifts: In: 4305 [I.V.:2707; Blood:315; NG/GT:645; IV Piggyback:638] Out: 1906 [Urine:155; Emesis/NG output:1751]   Intake/Output this shift:  Total I/O In: 161.6 [I.V.:161.6] Out: -   Frail elderly lady intubated nonresponsive CVS- RRR no rubs or gallops audible JVP not elevated RS-conical breath sounds throughout somewhat rhonchorous ABD-hypoactive bowel sounds EXT- no edema   Basic Metabolic Panel: Recent Labs  Lab 02/26/19 1653 02/27/19 0330 02/27/19 2005 02/28/19 0522 03/01/19 0417  03/02/19 0455 03/02/19 0551 03/02/19 1637 03/03/19 0108 03/03/19 0227 03/03/19 0353  NA  --  144  --  149* 149*   < > 149* 149* 146* 148* 146* 148*  K  --  3.6  --  2.9* 4.4   < > 4.9 6.4* 4.9 5.4* 4.8 5.6*  CL  --  112*  --  115* 121*  --  118*  --  119*  --  119*  --   CO2  --  20*  --  22 21*  --  26  --  17*  --  17*  --   GLUCOSE  --  146*  --  160* 150*  --  118*  --  113*  --  175*  --  BUN  --  48*  --  56* 64*  --  72*  --  77*  --  81*  --   CREATININE  --  3.03*  --  2.64* 2.78*  --  3.01*  --  3.43*  --  3.77*  --   CALCIUM  --  8.1*  --  8.5* 8.3*  --  8.3*  --  8.3*  --  8.2*  --   MG 2.3 2.4 2.3  --  2.4  --   --   --   --   --  2.3  --   PHOS 3.4 2.8 2.2* 2.6  --   --   --   --   --   --  4.1  --    < > = values in this interval not displayed.    Liver Function Tests: Recent Labs  Lab 03/16/2019 1512 03/13/2019 2019 02/25/19 0231 02/25/19 0724 02/26/19 0556 02/27/19 0330 02/28/19 0522 03/02/19 0455  AST 64* 68*  --   --  287* 215*  --  70*  ALT 35 35   --   --  81* 57*  --  <5  ALKPHOS 114 112  --   --  124 140*  --  145*  BILITOT 3.0* 3.2*  --  2.9* 2.9* 2.9*  --  1.9*  PROT 5.5* 5.2*  --   --  4.9* 5.1*  --  4.1*  ALBUMIN 2.9* 2.8* 2.4*  --  2.4* 2.6* 2.7* 2.0*   No results for input(s): LIPASE, AMYLASE in the last 168 hours. Recent Labs  Lab 02/27/19 0330 03/02/19 0455 03/03/19 0227  AMMONIA 55* 66* 43*    CBC: Recent Labs  Lab 03/24/2019 1512 03/16/2019 2019  03/01/19 0417  03/02/19 0455  03/02/19 0655 03/02/19 1637 03/03/19 0108 03/03/19 0227 03/03/19 0353  WBC 14.4* 15.3*   < > 27.4*  --  31.4*  --  31.8* 39.1*  --  40.3*  --   NEUTROABS 11.4* 11.5*  --   --   --   --   --   --   --   --   --   --   HGB 8.4* 8.1*   < > 7.5*   < > 6.8*   < > 6.8* 8.7* 9.5* 8.3* 7.8*  HCT 25.8* 24.0*   < > 23.2*   < > 20.9*   < > 21.0* 26.6* 28.0* 25.5* 23.0*  MCV 129.0* 129.0*   < > 133.3*  --  136.6*  --  137.3* 112.7*  --  114.9*  --   PLT PLATELET CLUMPS NOTED ON SMEAR, COUNT APPEARS DECREASED 53*   < > 24*  --  28*  --  36* 25*  --  25*  --    < > = values in this interval not displayed.    Cardiac Enzymes: Recent Labs  Lab 03/20/2019 1512  TROPONINI 0.04*    BNP: Invalid input(s): POCBNP  CBG: Recent Labs  Lab 03/02/19 1539 03/02/19 1940 03/02/19 2332 03/03/19 0323 03/03/19 0715  GLUCAP 100* 103* 148* 153* 183*    Microbiology: Results for orders placed or performed during the hospital encounter of 03/08/2019  Urine culture     Status: Abnormal   Collection Time: 03/14/2019  5:04 PM  Result Value Ref Range Status   Specimen Description URINE, RANDOM  Final   Special Requests   Final    NONE Performed at [facility], [address] [place], [place] [address]    Culture >=100,000 COLONIES/mL ESCHERICHIA COLI (A)  Final   Report Status 02/26/2019 FINAL  Final   Organism ID, Bacteria ESCHERICHIA COLI (A)  Final      Susceptibility   Escherichia coli - MIC*    AMPICILLIN >=32 RESISTANT Resistant      CEFAZOLIN >=64 RESISTANT Resistant     CEFTRIAXONE <=1 SENSITIVE Sensitive     CIPROFLOXACIN <=0.25 SENSITIVE Sensitive     GENTAMICIN <=1 SENSITIVE Sensitive     IMIPENEM <=0.25 SENSITIVE Sensitive     NITROFURANTOIN <=16 SENSITIVE Sensitive     TRIMETH/SULFA <=20 SENSITIVE Sensitive     AMPICILLIN/SULBACTAM >=32 RESISTANT Resistant     PIP/TAZO <=4 SENSITIVE Sensitive     Extended ESBL NEGATIVE Sensitive     * >=100,000 COLONIES/mL ESCHERICHIA COLI  Blood culture (routine x 2)     Status: Abnormal   Collection Time: 03/13/2019  6:20 PM  Result Value Ref Range Status   Specimen Description BLOOD LEFT ARM  Final   Special Requests   Final    BOTTLES DRAWN AEROBIC ONLY Blood Culture adequate volume   Culture  Setup Time   Final    GRAM POSITIVE COCCI IN CLUSTERS AEROBIC BOTTLE ONLY CRITICAL VALUE NOTED.  VALUE IS CONSISTENT WITH PREVIOUSLY REPORTED AND CALLED VALUE.    Culture (A)  Final    STAPHYLOCOCCUS AUREUS SUSCEPTIBILITIES PERFORMED ON PREVIOUS CULTURE WITHIN THE LAST 5 DAYS. Performed at [facility], [place] [address] [place], [place] [address]    Report Status 02/27/2019 FINAL  Final  Blood culture (routine x 2)     Status: Abnormal   Collection Time: 03/02/2019  6:22 PM  Result Value Ref Range Status   Specimen Description BLOOD RIGHT ANTECUBITAL  Final   Special Requests   Final    BOTTLES DRAWN AEROBIC AND ANAEROBIC Blood Culture results may not be optimal due to an inadequate volume of blood received in culture bottles   Culture  Setup Time   Final    GRAM POSITIVE COCCI IN CLUSTERS IN BOTH AEROBIC AND ANAEROBIC BOTTLES CRITICAL RESULT CALLED TO, READ BACK BY AND VERIFIED WITH: PHRMD D HANNA '@[id number]'  02/25/2019 BY S GEZAHEGN Performed at [facility], [place] [address] [place], [place] [address]    Culture STAPHYLOCOCCUS AUREUS (A)  Final   Report Status 02/27/2019 FINAL  Final   Organism ID, Bacteria STAPHYLOCOCCUS AUREUS  Final      Susceptibility    Staphylococcus aureus - MIC*    CIPROFLOXACIN <=0.5 SENSITIVE Sensitive     ERYTHROMYCIN 1 INTERMEDIATE Intermediate     GENTAMICIN <=0.5 SENSITIVE Sensitive     OXACILLIN <=0.25 SENSITIVE Sensitive     TETRACYCLINE <=1 SENSITIVE Sensitive     VANCOMYCIN <=0.5 SENSITIVE Sensitive     TRIMETH/SULFA <=10 SENSITIVE Sensitive     CLINDAMYCIN <=0.25 SENSITIVE Sensitive     RIFAMPIN <=0.5 SENSITIVE Sensitive     Inducible Clindamycin NEGATIVE Sensitive     * STAPHYLOCOCCUS AUREUS  Blood Culture ID Panel (Reflexed)     Status: Abnormal   Collection Time: 03/03/2019  6:22 PM  Result Value Ref Range Status   Enterococcus species NOT DETECTED NOT DETECTED Final   Listeria monocytogenes NOT DETECTED NOT DETECTED Final   Staphylococcus species DETECTED (A) NOT DETECTED Final    Comment: CRITICAL RESULT CALLED TO, READ BACK BY AND VERIFIED WITH: PHRMD D HANNA '@[id number]'  02/25/2019 BY S GEZAHEGN    Staphylococcus  aureus (BCID) DETECTED (A) NOT DETECTED Final    Comment: Methicillin (oxacillin) susceptible Staphylococcus aureus (MSSA). Preferred therapy is anti staphylococcal beta lactam antibiotic (Cefazolin or Nafcillin), unless clinically contraindicated. CRITICAL RESULT CALLED TO, READ BACK BY AND VERIFIED WITH: PHRMD D HANNA '@[id number]'  02/25/2019 BY S GEZAHEGN    Methicillin resistance NOT DETECTED NOT DETECTED Final   Streptococcus species NOT DETECTED NOT DETECTED Final   Streptococcus agalactiae NOT DETECTED NOT DETECTED Final   Streptococcus pneumoniae NOT DETECTED NOT DETECTED Final   Streptococcus pyogenes NOT DETECTED NOT DETECTED Final   Acinetobacter baumannii NOT DETECTED NOT DETECTED Final   Enterobacteriaceae species NOT DETECTED NOT DETECTED Final   Enterobacter cloacae complex NOT DETECTED NOT DETECTED Final   Escherichia coli NOT DETECTED NOT DETECTED Final   Klebsiella oxytoca NOT DETECTED NOT DETECTED Final   Klebsiella pneumoniae NOT DETECTED NOT DETECTED Final   Proteus species NOT  DETECTED NOT DETECTED Final   Serratia marcescens NOT DETECTED NOT DETECTED Final   Haemophilus influenzae NOT DETECTED NOT DETECTED Final   Neisseria meningitidis NOT DETECTED NOT DETECTED Final   Pseudomonas aeruginosa NOT DETECTED NOT DETECTED Final   Candida albicans NOT DETECTED NOT DETECTED Final   Candida glabrata NOT DETECTED NOT DETECTED Final   Candida krusei NOT DETECTED NOT DETECTED Final   Candida parapsilosis NOT DETECTED NOT DETECTED Final   Candida tropicalis NOT DETECTED NOT DETECTED Final    Comment: Performed at [facility], [place] [address], [place], [place] [address]  MRSA PCR Screening     Status: None   Collection Time: 02/25/19 12:58 AM  Result Value Ref Range Status   MRSA by PCR NEGATIVE NEGATIVE Final    Comment:        The GeneXpert MRSA Assay (FDA approved for NASAL specimens only), is one component of a comprehensive MRSA colonization surveillance program. It is not intended to diagnose MRSA infection nor to guide or monitor treatment for MRSA infections. Performed at [facility], [place] [address] [place], [place] [address]   Culture, blood (routine x 2)     Status: None   Collection Time: 02/26/19  5:56 AM  Result Value Ref Range Status   Specimen Description BLOOD LEFT ARM  Final   Special Requests   Final    BOTTLES DRAWN AEROBIC ONLY Blood Culture adequate volume   Culture   Final    NO GROWTH 5 DAYS Performed at [facility], [place] [address] [place], [place] [address]    Report Status 03/03/2019 FINAL  Final  Culture, blood (routine x 2)     Status: None   Collection Time: 02/26/19  5:56 AM  Result Value Ref Range Status   Specimen Description BLOOD LEFT HAND  Final   Special Requests   Final    BOTTLES DRAWN AEROBIC ONLY Blood Culture results may not be optimal due to an inadequate volume of blood received in culture bottles   Culture   Final    NO GROWTH 5 DAYS Performed at [facility], [place] [address]  [place], [place] [address]    Report Status 03/03/2019 FINAL  Final  Culture, respiratory (non-expectorated)     Status: None (Preliminary result)   Collection Time: 03/02/19  3:18 AM  Result Value Ref Range Status   Specimen Description TRACHEAL ASPIRATE  Final   Special Requests NONE  Final   Gram Stain   Final    MODERATE WBC PRESENT, PREDOMINANTLY PMN MODERATE YEAST  WITH PSEUDOHYPHAE Performed at [facility], [place] [address], [place], [place] [address]    Culture PENDING  Incomplete   Report Status PENDING  Incomplete    Coagulation Studies: No results for input(s): LABPROT, INR in the last 72 hours.  Urinalysis: No results for input(s): COLORURINE, LABSPEC, PHURINE, GLUCOSEU, HGBUR, BILIRUBINUR, KETONESUR, PROTEINUR, UROBILINOGEN, NITRITE, LEUKOCYTESUR in the last 72 hours.  Invalid input(s): APPERANCEUR    Imaging: Ct Head Wo Contrast  Result Date: 03/02/2019 CLINICAL DATA:  Altered mental status EXAM: CT HEAD WITHOUT CONTRAST TECHNIQUE: Contiguous axial images were obtained from the base of the skull through the vertex without intravenous contrast. COMPARISON:  Head CT 03/08/2019 FINDINGS: Brain: Streak artifact from a left cochlear implant again partially obscures the brain. No hemorrhage or other extra-axial collection is visible. Normal appearance of brain parenchyma. Vascular: No abnormal hyperdensity of the major intracranial arteries or dural venous sinuses. No intracranial atherosclerosis. Skull: The visualized skull base, calvarium are normal. The right parotid gland is enlarged. Sinuses/Orbits: No fluid levels or advanced mucosal thickening of the visualized paranasal sinuses. Postsurgical changes of the left mastoid. The orbits are normal. IMPRESSION: 1. Streak artifact from left cochlear implant partially obscures the brain, the within that limitation there is no acute intracranial abnormality. 2. Enlarged right parotid gland, incompletely visualized. Electronically  Signed   By: Ulyses Jarred M.D.   On: 03/02/2019 15:37   Dg Chest Port 1 View  Result Date: 03/03/2019 CLINICAL DATA:  Intubation EXAM: PORTABLE CHEST 1 VIEW COMPARISON:  03/02/2019, 02/28/2019, 02/26/2019 FINDINGS: Endotracheal tube tip is just above the carina. Esophageal tube tip below the diaphragm but non included. Left IJ central venous catheter tip over the mid right atrium. Worsening airspace disease at the right infrahilar lung. Persistent airspace disease at the left base. Probable small pleural effusions. Stable cardiomediastinal silhouette with aortic atherosclerosis. Linear scarring or atelectasis in the right upper lobe. IMPRESSION: 1. Endotracheal tube tip just above the carina. 2. Left IJ central venous catheter tip projects over the mid right atrium 3. Worsening airspace disease in the right infrahilar lung and lung base. Suspected trace pleural effusions and left basilar airspace disease are otherwise unchanged Electronically Signed   By: Donavan Foil M.D.   On: 03/03/2019 01:20   Dg Chest Port 1 View  Result Date: 03/02/2019 CLINICAL DATA:  Life support line placement. EXAM: PORTABLE CHEST 1 VIEW COMPARISON:  Chest radiograph February 28, 2019 FINDINGS: Endotracheal tube tip projects 11 mm above the carina. LEFT internal jugular central venous catheter distal tip projects in distal superior vena cava. Nasogastric tube past mid stomach, distal tip out of field of view. Cardiac silhouette is upper limits of normal in size. Calcified aortic arch. Bibasilar strandy densities. No pleural effusion focal consolidation. No pneumothorax. Soft tissue planes and included osseous structures are non suspicious. IMPRESSION: 1. Endotracheal tube tip projects 11 mm above the carina, recommend 1-2 cm retraction. LEFT internal jugular central venous catheter distal tip projects in distal superior vena cava. Nasogastric tube past mid stomach. 2. Bibasilar atelectasis/scarring. 3.  Aortic Atherosclerosis  (ICD10-I70.0). Electronically Signed   By: Elon Alas M.D.   On: 03/02/2019 04:29     Medications:   . sodium chloride    .  ceFAZolin (ANCEF) IV Stopped (03/02/19 2140)  . dextrose 5 % and 0.45% NaCl 50 mL/hr at 03/03/19 0800  . feeding supplement (JEVITY 1.2 CAL) Stopped (03/02/19 0330)  . fentaNYL infusion INTRAVENOUS    . metronidazole Stopped (03/03/19  0425)  . norepinephrine (LEVOPHED) Adult infusion 18 mcg/min (03/03/19 0800)  . vasopressin (PITRESSIN) infusion - *FOR SHOCK* 0.03 Units/min (03/03/19 0800)   . chlorhexidine gluconate (MEDLINE KIT)  15 mL Mouth Rinse BID  . Chlorhexidine Gluconate Cloth  6 each Topical Q0600  . free water  200 mL Per Tube Q6H  . lactulose  30 g Per Tube TID  . levothyroxine  37.5 mcg Per Tube Daily  . mouth rinse  15 mL Mouth Rinse 10 times per day  . pantoprazole (PROTONIX) IV  40 mg Intravenous Q24H  . rifaximin  400 mg Oral Q8H   Place/Maintain arterial line **AND** sodium chloride, acetaminophen **OR** acetaminophen, dextrose, docusate, fentaNYL, fentaNYL (SUBLIMAZE) injection, [DISCONTINUED] ondansetron **OR** ondansetron (ZOFRAN) IV  Assessment/ Plan:   Acute on chronic kidney injury stage IV baseline poor candidate for long-term dialysis.  Is been evaluated at [facility] in the past.  It appears that she has become increasingly more volume overloaded with worsening serum creatinine and oliguria.  Palliative care have been recommended.  Discussed with family and will not escalate care to place patient on CRRT.  Discussed with Dr. Nelda Marseille who is in agreement  Hyperkalemia patient now being resuscitated with IV pressors now intubated.  Appears to have resolved with use of Kayexalate.  Continue temporizing measures  Hypotension and shock.  Patient now continues on Levophed and vasopressin has been added.  Prognosis is very poor.  Cirrhosis with ascites and encephalopathy continues on lactulose and rifaximin.  MSSA  bacteremia commended to continue Ancef TEE when able.  Aspiration pneumonia now intubated Flagyl added per infectious disease  Anemia per critical care transfusion 03/02/2019  Hypothyroidism continue Synthroid 37.5 mg daily  Disposition patient's prognosis appears to be poor.  We will continue to follow with critical care   LOS: [place] '@TODAY' '@8' :15 AM

## 2019-03-03 NOTE — Progress Notes (Signed)
[facility] Progress Note Patient Name: [name] DOB: Sep 12, 1946 MRN: [id number]   Date of Service  03/03/2019  HPI/Events of Note  Agitation - Request for Fentanyl IV PRN for sedation.   eICU Interventions  Will order: 1. Fentanyl 25-50 mcg IV Q 2 hours PRN sedation.     Intervention Category Major Interventions: Delirium, psychosis, severe agitation - evaluation and management  Sommer,Steven Eugene 03/03/2019, 4:59 AM

## 2019-03-03 NOTE — Progress Notes (Signed)
Nutrition Follow-up  DOCUMENTATION CODES:   Not applicable  INTERVENTION:    If aggressive care resumes, and unable to use gut for nutrition, consider TPN.  If able to utilize gut, recommend TF via OGT with Vital AF 1.2 at 50 ml/h to provide 1440 kcal, 90 gm protein, 973 ml free water daily.  NUTRITION DIAGNOSIS:   Inadequate oral intake related to inability to eat as evidenced by NPO status.  Ongoing   GOAL:   Patient will meet greater than or equal to 90% of their needs  Unmet  MONITOR:   TF tolerance, Labs, Skin, I & O's  ASSESSMENT:   73 yo female with PMH of thyroid DZ, HTN, liver DZ, anemia, renal disorder who was admitted with AMS, elevated ammonia, lactic acidosis. Transferred to the ICU 3/4.  Patient found to have liver cirrhosis and possible bowel ischemia. Patient required intubation on 3/9. Remains intubated on ventilator support MV: 8.9 L/min Temp (24hrs), Avg:99.3 F (37.4 C), Min:98.3 F (36.8 C), Max:100.6 F (38.1 C)   Labs reviewed. Sodium 148 (H), potassium 5.6 (H), Lactic acid 4.1 (H) CBG's: [phone] Medications reviewed and include novolog, lactulose, levophed, vasopressin.   TF off since 3/8.  Patient is now DNR. Plans for no escalation of care.  Diet Order:   Diet Order            Diet NPO time specified  Diet effective now              EDUCATION NEEDS:   No education needs have been identified at this time  Skin:  Skin Assessment: Reviewed RN Assessment  Last BM:  3/10  Height:   Ht Readings from Last 1 Encounters:  02/23/2019 $RemoveB'5\' 1"'[id number]$  (1.549 m)    Weight:   Wt Readings from Last 1 Encounters:  03/03/19 62.8 kg    Ideal Body Weight:  47.7 kg  BMI:  Body mass index is 26.16 kg/m.  Estimated Nutritional Needs:   Kcal:  1455  Protein:  90-100 gm  Fluid:  >/= 1.4 L    Molli Barrows, RD, LDN, [place] Pager [phone] After Hours Pager [phone]

## 2019-03-04 ENCOUNTER — Inpatient Hospital Stay (HOSPITAL_COMMUNITY): Payer: Medicare Other

## 2019-03-04 DIAGNOSIS — Z515 Encounter for palliative care: Secondary | ICD-10-CM

## 2019-03-04 DIAGNOSIS — J189 Pneumonia, unspecified organism: Secondary | ICD-10-CM

## 2019-03-04 LAB — POCT I-STAT 7, (LYTES, BLD GAS, ICA,H+H)
Acid-base deficit: 4 mmol/L — ABNORMAL HIGH (ref 0.0–2.0)
Bicarbonate: 20.9 mmol/L (ref 20.0–28.0)
Calcium, Ion: 1.24 mmol/L (ref 1.15–1.40)
HEMATOCRIT: 56 % — AB (ref 36.0–46.0)
Hemoglobin: 19 g/dL — ABNORMAL HIGH (ref 12.0–15.0)
O2 Saturation: 91 %
Patient temperature: 98.2
Potassium: 4.2 mmol/L (ref 3.5–5.1)
Sodium: 150 mmol/L — ABNORMAL HIGH (ref 135–145)
TCO2: 22 mmol/L (ref 22–32)
pCO2 arterial: 36.3 mmHg (ref 32.0–48.0)
pH, Arterial: 7.367 (ref 7.350–7.450)
pO2, Arterial: 63 mmHg — ABNORMAL LOW (ref 83.0–108.0)

## 2019-03-04 LAB — COMPREHENSIVE METABOLIC PANEL
ALT: <5 U/L (ref 0–44)
AST: 41 U/L (ref 15–41)
Albumin: 2 g/dL — ABNORMAL LOW (ref 3.5–5.0)
Alkaline Phosphatase: 113 U/L (ref 38–126)
Anion gap: 6 (ref 5–15)
BUN: 91 mg/dL — ABNORMAL HIGH (ref 8–23)
CO2: 22 mmol/L (ref 22–32)
Calcium: 8.5 mg/dL — ABNORMAL LOW (ref 8.9–10.3)
Chloride: 120 mmol/L — ABNORMAL HIGH (ref 98–111)
Creatinine, Ser: 3.99 mg/dL — ABNORMAL HIGH (ref 0.44–1.00)
GFR calc Af Amer: 12 mL/min — ABNORMAL LOW (ref >60)
GFR calc non Af Amer: 10 mL/min — ABNORMAL LOW (ref >60)
Glucose, Bld: 144 mg/dL — ABNORMAL HIGH (ref 70–99)
Potassium: 4.1 mmol/L (ref 3.5–5.1)
Sodium: 148 mmol/L — ABNORMAL HIGH (ref 135–145)
Total Bilirubin: 2.1 mg/dL — ABNORMAL HIGH (ref 0.3–1.2)
Total Protein: 4.1 g/dL — ABNORMAL LOW (ref 6.5–8.1)

## 2019-03-04 LAB — CBC
HCT: 24.2 % — ABNORMAL LOW (ref 36.0–46.0)
Hemoglobin: 8 g/dL — ABNORMAL LOW (ref 12.0–15.0)
MCH: 37.6 pg — ABNORMAL HIGH (ref 26.0–34.0)
MCHC: 33.1 g/dL (ref 30.0–36.0)
MCV: 113.6 fL — ABNORMAL HIGH (ref 80.0–100.0)
Platelets: 23 10*3/uL — CL (ref 150–400)
RBC: 2.13 MIL/uL — ABNORMAL LOW (ref 3.87–5.11)
RDW: 36.4 % — ABNORMAL HIGH (ref 11.5–15.5)
WBC: 43.5 10*3/uL — ABNORMAL HIGH (ref 4.0–10.5)
nRBC: 7 % — ABNORMAL HIGH (ref 0.0–0.2)

## 2019-03-04 LAB — GLUCOSE, CAPILLARY
GLUCOSE-CAPILLARY: 119 mg/dL — AB (ref 70–99)
Glucose-Capillary: 115 mg/dL — ABNORMAL HIGH (ref 70–99)
Glucose-Capillary: 120 mg/dL — ABNORMAL HIGH (ref 70–99)
Glucose-Capillary: 154 mg/dL — ABNORMAL HIGH (ref 70–99)
Glucose-Capillary: 156 mg/dL — ABNORMAL HIGH (ref 70–99)
Glucose-Capillary: 164 mg/dL — ABNORMAL HIGH (ref 70–99)
Glucose-Capillary: 77 mg/dL (ref 70–99)

## 2019-03-04 LAB — LACTIC ACID, PLASMA: Lactic Acid, Venous: 2.7 mmol/L (ref 0.5–1.9)

## 2019-03-04 LAB — MAGNESIUM: Magnesium: 2.6 mg/dL — ABNORMAL HIGH (ref 1.7–2.4)

## 2019-03-04 LAB — PHOSPHORUS: Phosphorus: 3.9 mg/dL (ref 2.5–4.6)

## 2019-03-04 MED ORDER — SODIUM BICARBONATE 650 MG PO TABS
650.0000 mg | ORAL_TABLET | Freq: Two times a day (BID) | ORAL | Status: DC
  Administered 2019-03-04 – 2019-03-05 (×2): 650 mg
  Filled 2019-03-04 (×2): qty 1

## 2019-03-04 MED ORDER — JEVITY 1.2 CAL PO LIQD
1000.0000 mL | ORAL | Status: DC
  Administered 2019-03-04: 1000 mL
  Filled 2019-03-04: qty 1000

## 2019-03-04 MED ORDER — DEXTROSE 5 % IV SOLN
500.0000 mg | INTRAVENOUS | Status: DC
  Administered 2019-03-04 – 2019-03-05 (×2): 500 mg via INTRAVENOUS
  Filled 2019-03-04 (×4): qty 0.5

## 2019-03-04 NOTE — Progress Notes (Signed)
[facility]   Reason for visit: Follow up on MSSA bacteremia  Interval History: remains intubated, sedated, WBC continues to rise, remains afebrile.  Family members at bedside.     Physical Exam: Constitutional:  Vitals:   03/04/19 0800 03/04/19 0815  BP:  (!) 124/41  Pulse: 79 82  Resp: 20 (!) 24  Temp: 97.7 F (36.5 C)   SpO2: 96% 93%   patient is sedated Eyes: anicteric HENT: +ET Respiratory: respiratory effort on vent; decreased wheezes Cardiovascular: Tachy RR GI: soft, nt, nd  Review of Systems: Unable to be assessed due to patient factors  Lab Results  Component Value Date   WBC 43.5 (H) 03/04/2019   HGB 19.0 (H) 03/04/2019   HCT 56.0 (H) 03/04/2019   MCV 113.6 (H) 03/04/2019   PLT 23 (LL) 03/04/2019    Lab Results  Component Value Date   CREATININE 3.99 (H) 03/04/2019   BUN 91 (H) 03/04/2019   NA 150 (H) 03/04/2019   K 4.2 03/04/2019   CL 120 (H) 03/04/2019   CO2 22 03/04/2019    Lab Results  Component Value Date   ALT <5 03/04/2019   AST 41 03/04/2019   ALKPHOS 113 03/04/2019     Microbiology: Recent Results (from the past 240 hour(s))  Urine culture     Status: Abnormal   Collection Time: 02/26/2019  5:04 PM  Result Value Ref Range Status   Specimen Description URINE, RANDOM  Final   Special Requests   Final    NONE Performed at [facility], [address], [place], [place] [address]    Culture >=100,000 COLONIES/mL ESCHERICHIA COLI (A)  Final   Report Status 02/26/2019 FINAL  Final   Organism ID, Bacteria ESCHERICHIA COLI (A)  Final      Susceptibility   Escherichia coli - MIC*    AMPICILLIN >=32 RESISTANT Resistant     CEFAZOLIN >=64 RESISTANT Resistant     CEFTRIAXONE <=1 SENSITIVE Sensitive     CIPROFLOXACIN <=0.25 SENSITIVE Sensitive     GENTAMICIN <=1 SENSITIVE Sensitive     IMIPENEM <=0.25 SENSITIVE Sensitive     NITROFURANTOIN <=16 SENSITIVE Sensitive     TRIMETH/SULFA <=20 SENSITIVE  Sensitive     AMPICILLIN/SULBACTAM >=32 RESISTANT Resistant     PIP/TAZO <=4 SENSITIVE Sensitive     Extended ESBL NEGATIVE Sensitive     * >=100,000 COLONIES/mL ESCHERICHIA COLI  Blood culture (routine x 2)     Status: Abnormal   Collection Time: 03/18/2019  6:20 PM  Result Value Ref Range Status   Specimen Description BLOOD LEFT ARM  Final   Special Requests   Final    BOTTLES DRAWN AEROBIC ONLY Blood Culture adequate volume   Culture  Setup Time   Final    GRAM POSITIVE COCCI IN CLUSTERS AEROBIC BOTTLE ONLY CRITICAL VALUE NOTED.  VALUE IS CONSISTENT WITH PREVIOUSLY REPORTED AND CALLED VALUE.    Culture (A)  Final    STAPHYLOCOCCUS AUREUS SUSCEPTIBILITIES PERFORMED ON PREVIOUS CULTURE WITHIN THE LAST 5 DAYS. Performed at [facility], [place] [address] [place], [place] [address]    Report Status 02/27/2019 FINAL  Final  Blood culture (routine x 2)     Status: Abnormal   Collection Time: 03/18/2019  6:22 PM  Result Value Ref Range Status   Specimen Description BLOOD RIGHT ANTECUBITAL  Final   Special Requests   Final    BOTTLES DRAWN AEROBIC AND ANAEROBIC Blood Culture  results may not be optimal due to an inadequate volume of blood received in culture bottles   Culture  Setup Time   Final    GRAM POSITIVE COCCI IN CLUSTERS IN BOTH AEROBIC AND ANAEROBIC BOTTLES CRITICAL RESULT CALLED TO, READ BACK BY AND VERIFIED WITH: PHRMD D HANNA $Remov'@1142'[id number]$  02/25/2019 BY S GEZAHEGN Performed at [facility], [place] [address], [place], [place] [address]    Culture STAPHYLOCOCCUS AUREUS (A)  Final   Report Status 02/27/2019 FINAL  Final   Organism ID, Bacteria STAPHYLOCOCCUS AUREUS  Final      Susceptibility   Staphylococcus aureus - MIC*    CIPROFLOXACIN <=0.5 SENSITIVE Sensitive     ERYTHROMYCIN 1 INTERMEDIATE Intermediate     GENTAMICIN <=0.5 SENSITIVE Sensitive     OXACILLIN <=0.25 SENSITIVE Sensitive     TETRACYCLINE <=1 SENSITIVE Sensitive     VANCOMYCIN <=0.5 SENSITIVE Sensitive      TRIMETH/SULFA <=10 SENSITIVE Sensitive     CLINDAMYCIN <=0.25 SENSITIVE Sensitive     RIFAMPIN <=0.5 SENSITIVE Sensitive     Inducible Clindamycin NEGATIVE Sensitive     * STAPHYLOCOCCUS AUREUS  Blood Culture ID Panel (Reflexed)     Status: Abnormal   Collection Time: 03/06/2019  6:22 PM  Result Value Ref Range Status   Enterococcus species NOT DETECTED NOT DETECTED Final   Listeria monocytogenes NOT DETECTED NOT DETECTED Final   Staphylococcus species DETECTED (A) NOT DETECTED Final    Comment: CRITICAL RESULT CALLED TO, READ BACK BY AND VERIFIED WITH: PHRMD D HANNA $Remov'@1142'SuMyjc$  02/25/2019 BY S GEZAHEGN    Staphylococcus aureus (BCID) DETECTED (A) NOT DETECTED Final    Comment: Methicillin (oxacillin) susceptible Staphylococcus aureus (MSSA). Preferred therapy is anti staphylococcal beta lactam antibiotic (Cefazolin or Nafcillin), unless clinically contraindicated. CRITICAL RESULT CALLED TO, READ BACK BY AND VERIFIED WITH: PHRMD D HANNA $Remov'@1142'vWeUHc$  02/25/2019 BY S GEZAHEGN    Methicillin resistance NOT DETECTED NOT DETECTED Final   Streptococcus species NOT DETECTED NOT DETECTED Final   Streptococcus agalactiae NOT DETECTED NOT DETECTED Final   Streptococcus pneumoniae NOT DETECTED NOT DETECTED Final   Streptococcus pyogenes NOT DETECTED NOT DETECTED Final   Acinetobacter baumannii NOT DETECTED NOT DETECTED Final   Enterobacteriaceae species NOT DETECTED NOT DETECTED Final   Enterobacter cloacae complex NOT DETECTED NOT DETECTED Final   Escherichia coli NOT DETECTED NOT DETECTED Final   Klebsiella oxytoca NOT DETECTED NOT DETECTED Final   Klebsiella pneumoniae NOT DETECTED NOT DETECTED Final   Proteus species NOT DETECTED NOT DETECTED Final   Serratia marcescens NOT DETECTED NOT DETECTED Final   Haemophilus influenzae NOT DETECTED NOT DETECTED Final   Neisseria meningitidis NOT DETECTED NOT DETECTED Final   Pseudomonas aeruginosa NOT DETECTED NOT DETECTED Final   Candida albicans NOT DETECTED NOT  DETECTED Final   Candida glabrata NOT DETECTED NOT DETECTED Final   Candida krusei NOT DETECTED NOT DETECTED Final   Candida parapsilosis NOT DETECTED NOT DETECTED Final   Candida tropicalis NOT DETECTED NOT DETECTED Final    Comment: Performed at [facility], [place]. [address] [place], [place] [address]  MRSA PCR Screening     Status: None   Collection Time: 02/25/19 12:58 AM  Result Value Ref Range Status   MRSA by PCR NEGATIVE NEGATIVE Final    Comment:        The GeneXpert MRSA Assay (FDA approved for NASAL specimens only), is one component of a comprehensive MRSA colonization surveillance program. It is not intended to diagnose  MRSA infection nor to guide or monitor treatment for MRSA infections. Performed at [facility], [place] [address] [place], [place] [address]   Culture, blood (routine x 2)     Status: None   Collection Time: 02/26/19  5:56 AM  Result Value Ref Range Status   Specimen Description BLOOD LEFT ARM  Final   Special Requests   Final    BOTTLES DRAWN AEROBIC ONLY Blood Culture adequate volume   Culture   Final    NO GROWTH 5 DAYS Performed at [facility], [place] [address], [place], [place] [address]    Report Status 03/03/2019 FINAL  Final  Culture, blood (routine x 2)     Status: None   Collection Time: 02/26/19  5:56 AM  Result Value Ref Range Status   Specimen Description BLOOD LEFT HAND  Final   Special Requests   Final    BOTTLES DRAWN AEROBIC ONLY Blood Culture results may not be optimal due to an inadequate volume of blood received in culture bottles   Culture   Final    NO GROWTH 5 DAYS Performed at [facility], [place] [address] [place], [place] [address]    Report Status 03/03/2019 FINAL  Final  Culture, respiratory (non-expectorated)     Status: Abnormal (Preliminary result)   Collection Time: 03/02/19  3:18 AM  Result Value Ref Range Status   Specimen Description TRACHEAL ASPIRATE  Final   Special Requests NONE   Final   Gram Stain   Final    MODERATE WBC PRESENT, PREDOMINANTLY PMN MODERATE YEAST WITH PSEUDOHYPHAE Performed at [facility], [place] [address], [place], [place] [address]    Culture (A)  Final    FUNGUS (MOLD) ISOLATED, PROBABLE CONTAMINANT/COLONIZER (SAPROPHYTE). CONTACT MICROBIOLOGY IF FURTHER IDENTIFICATION REQUIRED [phone].   Report Status PENDING  Incomplete    Impression/Plan:  1. MSSA bacteremia - continues on cefazolin.   TEE when able Repeat cultures ngtd  2.  Pneumonia - broadened to cefepime  3.  Respiratory failure - Remains intubated.  Remains on pressor support  Discussed with family at bedside

## 2019-03-04 NOTE — Progress Notes (Signed)
PHARMACY NOTE:  ANTIMICROBIAL RENAL DOSAGE ADJUSTMENT  Current antimicrobial regimen includes a mismatch between antimicrobial dosage and estimated renal function.  As per policy approved by the Pharmacy & Therapeutics and Medical Executive Committees, the antimicrobial dosage will be adjusted accordingly.  Current antimicrobial dosage:  Cefepime 1 g IV q24h  Indication: MSSA Bacteremia, superimposed PNA  Renal Function: Patient is not a candidate for dialysis and family does not want dialysis   Estimated Creatinine Clearance: 10.5 mL/min (A) (by C-G formula based on SCr of 3.99 mg/dL (H)). $RemoveBef'[]'JmSFUKTUWA$      On intermittent HD, scheduled: $RemoveBefore'[]'[id number]$      On CRRT    Antimicrobial dosage has been changed to:  Cefepime 500 mg IV q24h   Thank you for allowing pharmacy to be a part of this patient's care.  Jackson Latino, PharmD PGY1 Pharmacy Resident Phone [phone] 03/04/2019     8:44 AM

## 2019-03-04 NOTE — Progress Notes (Addendum)
NAME:  [name], MRN:  [id number], DOB:  [birth year], LOS: 8 ADMISSION DATE:  03/06/2019, CONSULTATION DATE:  02/25/19 REFERRING MD:  Alfredia Ferguson  CHIEF COMPLAINT:  AMS   Brief History   73 yo female presented with altered mental status and elevated ammonia level and lactic acidosis.  Found to have liver cirrhosis and possible bowel ischemia.  Had progressive lactic acidosis and altered mental status and transferred to ICU.  Past Medical History  HTN, liver disease, hypothyroidism, CKD IV, Anemia.  Significant Hospital Events   3/3 > admit. 3/4 > transfer to ICU. 3/8 > mental status worse 3/9 intubated for inability to protect her airway 3/10 seen by nephrology.  Renal function continues to decline.  Acid-base worse.  Not HD candidate. Added cefepime for PNA. Spoke w/ family. DNR if arrests. Cont medical management for now.  3/11: still pressor dependent but decreased. cxr about same. Renal fxn a little worse. Starting trickle feed.  Consults:  Nephrology ID Hematology PCCM  Procedures:  TTE 3/05 >> EF greater that 38%, mod RV systolic dysfx, no vegetations  Significant Diagnostic Tests:  CT head 3/3 > left sided cochlear implant. CT MXLFCL 3/3 > changes of parotitis on the right without visible abscess. RUQ [place] 3/3 > cirrhosis with ascites.  Biliary sludge without signs of acute cholecystitis. CT A / P 3/4 > hepatic cirrhosis with moderate ascites, colonic wall thickening of the ascending colon.  Air in stool throughout possibly due to colonic pneumatosis and suspicious for ischemia.  Pelvic floor descent with rectocele. CT right femur 3/4 > neg.  Micro Data:  Blood 3/3 > Staph aureus Urine 3/3 > E coli  Antimicrobials:  Vanc 3/4 > 3/4 Cefepime 3/4 > 3/4  Flagyl 3/4 > 3/4 Ancef 3/4 > 3/10 Cefepime 3/10>>  Interim history/subjective:  Pressor requirements have improved Objective:  Blood pressure (Abnormal) 124/41, pulse 82, temperature 97.7 F (36.5 C), temperature source  Oral, resp. rate (Abnormal) 24, height $RemoveBe'5\' 1"'XuzgmwNWr$  (1.549 m), weight 62.6 kg, SpO2 93 %.    Vent Mode: PRVC FiO2 (%):  [60 %] 60 % Set Rate:  [20 bmp] 20 bmp Vt Set:  [400 mL] 400 mL PEEP:  [5 cmH20] 5 cmH20 Plateau Pressure:  [16 cmH20-19 cmH20] 16 cmH20   Intake/Output Summary (Last 24 hours) at 03/04/2019 1040 Last data filed at 03/04/2019 0800 Gross per 24 hour  Intake 1542.51 ml  Output 660 ml  Net 882.51 ml   Filed Weights   03/02/19 0500 03/03/19 0447 03/04/19 0404  Weight: 59.1 kg 62.8 kg 62.6 kg    Examination:  General: This is a frail 73 year old female patient she appears chronically malnourished, remains ventilator and pressor dependent however pressor requirements have diminished HEENT: Normocephalic atraumatic she does exhibit temporal wasting she is orally intubated because membranes are dry Pulmonary: Coarse scattered rhonchi no accessory use Cardiac: Regular rate and rhythm Abdomen: Soft, not tender no organomegaly Extremities: Dependent edema/diffuse anasarca.  Scattered areas of ecchymosis, pulses are palpable Neurologic: Intermittently awake, not following commands but is impulsive, no focal motor deficits appreciated GU: Concentrated yellow urine  Resolved issues  Lactic acidosis  Assessment & Plan:   Acute hepatic encephalopathy in setting of sepsis. Cirrhosis with ascites. Mental status progressively worse. Plan Cont rifaximin  Cont lactulose Ammonia level in am Cont supportive care   Severe sepsis/septic shock  with MSSA bacteremia and new aspiration PNA (3/9) Continues to have significantly elevated white blood cell count Plan Changed to cefepime 3/10 to cover  for aspiration and bacteremia Cont current pressors for MAP goal > 65 No further escalation (not adding more pressors) No role for TEE unless has recovery (which seems unlikely)   Acute hypoxic respiratory failure secondary to compromised airway.. Further complicated by aspiration  PNA pcxr personally reviewed and c/w prior film from day before. ETT good position as is CVL. Persistent RLL airspace disease w/out sig change  Plan Cont full vent support PAD protocol RASS goal 0 VAP bundle  abx per above  Acute on chronic renal failure with history of CKD 4, appears to be headed towards end-stage renal disease Plan Cont supportive care Not HD candidate   Fluid and electrolyte imbalance: Hypernatremia, hyperchloremia,Non-gap metabolic acidosis -renal fxn worse. Acid base and K improved.  Plan Cont free water; will increase d5w to 75 cc/hr (per nephrology recs) Decrease bicarb VT dosing ti bid Am chemistry  Anemia of critical illness, iron deficiency and chronic disease. Thrombocytopenia, most likely from sepsis and cirrhosis. Plan Transfuse for hgb < 7 and PLTs < 10 Trend cbc   Hx of hypothyroidism. Plan Cont synthroid  Hypoglycemia, now hyperglycemia Moderate protein calorie malnutrition. Plan Holding tubefeeds d/t N/V  Hx HTN. Plan Holding diuretics and antihypertensives   Goals of care. Plan DNR  Best Practice:  Diet: tube feeds DVT prophylaxis: SCD's. GI prophylaxis: protonix Mobility: Bedrest. Code Status: Full.-->DNR  Family Communication: updated daughter at bedside Disposition: she remains critically ill, requiring ongoing titration of ventilatory support, titration of vasoactive drips, close observation of labs, correction of electrolyte imbalances, and ongoing life support measures.  For today we will continue the goals as outlined above.  Her renal function is worse, however her pressor requirements are better.  We will continue current therapies as outlined, hopefully we will see her creatinine plateau.  I am still concerned, I think her chances of surviving are quite poor family remains hopeful, however they are realistic and understand the odds are not in her favor  Critical care time 32 minutes  Erick Colace ACNP-BC [place] Pager # [phone] OR # [phone] if no answer  Attending Note:  73 year old female with MODS who presents to PCCM with respiratory failure and intubated.  Patient is weaning well this AM and following some commands.  I reviewed CXR myself, ETT is in a good position.  Discussed with PCCM-NP.  Will continue weaning for now.  No extubation until family decides on plan of care.  DNR for now and no further escalation of care.  Family to decide on plan of care after the discussion I had with her this AM.  The patient is critically ill with multiple organ systems failure and requires high complexity decision making for assessment and support, frequent evaluation and titration of therapies, application of advanced monitoring technologies and extensive interpretation of multiple databases.   Critical Care Time devoted to patient care services described in this note is  33  Minutes. This time reflects time of care of this signee Dr Jennet Maduro. This critical care time does not reflect procedure time, or teaching time or supervisory time of PA/NP/Med student/Med Resident etc but could involve care discussion time.  Rush Farmer, M.D. [facility] Pulmonary/Critical Care Medicine. Pager: [phone]. After hours pager: [phone].

## 2019-03-04 NOTE — Progress Notes (Signed)
Patient with large amount of emesis tube feedings stopped OG connected to low intermittent suction and Zofran given IV

## 2019-03-04 NOTE — Progress Notes (Signed)
[facility] ROUNDING NOTE   Subjective:   73 year old lady with history of hypertension hypothyroidism liver cirrhosis and chronic kidney disease stage IV baseline creatinine about 2.5-3.0 she was admitted with MSSA bacteremia and acute hypoxic respiratory failure with worsening encephalopathy,.  Her renal function appears to be relatively stable.  Creatinine was 2.8 on admission. renal ultrasound showed no hydronephrosis but bilateral renal cortical atrophy.  On 03/02/2019 she underwent intubation and placement of triple-lumen catheter.  Discussion with daughter at bedside patient is known to me from my outpatient practice and we have discussed dialysis.  She is not a candidate for dialysis and does not want to have dialysis.    Blood pressure 115/46 pulse 72 temperature 97.7 O2 sats 96% FiO2 60%  Oliguric urine output 100 cc.  NG output 450 cc cc.  Weight 62. 6 kg.  Transfused 1 unit packed red blood cells 03/02/2019  Sodium 150 potassium 4.2 chloride 120 CO2 22 BUN 91 creatinine 3.99 calcium 8.5 phosphorus 3.9 albumin 2.0 AST 41 ALT less than 5 hemoglobin 8.0 hemoglobin stat appears to be 19?  Medications cefepime 500 mg daily, rifaximin 550 mg twice daily, Levophed titrated between 0 and 40 mcg/min.,  Vasopressin 0.03 units/min, Synthroid 37.5 mcg daily, Protonix 40 mg daily, lactulose 30cc 3 times daily.  Sodium bicarbonate 650 mg 3 times daily  CT abdomen 02/25/2019 with hepatic cirrhosis colonic wall thickening of the ascending colon advanced aortobiiliac atherosclerosis abdominal ascites.  Air noted in stool throughout possibly due to colonic pneumatosis or suspicion for ischemia  CT scan of head 03/02/2019 straight cath artifact from left cochlear implant and enlarged right parotid gland no acute intracranial abnormality  More awake and alert today still intubated requiring high concentrations of FiO2.    Objective:  Vital signs in last 24 hours:  Temp:  [97.7 F (36.5  C)-98.3 F (36.8 C)] 97.7 F (36.5 C) (03/11 0800) Pulse Rate:  [46-90] 82 (03/11 0815) Resp:  [0-27] 24 (03/11 0815) BP: (88-124)/(41-69) 124/41 (03/11 0815) SpO2:  [91 %-100 %] 93 % (03/11 0815) Arterial Line BP: (90-141)/(34-55) 125/42 (03/11 0800) FiO2 (%):  [60 %] 60 % (03/11 0815) Weight:  [62.6 kg] 62.6 kg (03/11 0404)  Weight change: -0.2 kg Filed Weights   03/02/19 0500 03/03/19 0447 03/04/19 0404  Weight: 59.1 kg 62.8 kg 62.6 kg    Intake/Output: I/O last 3 completed shifts: In: 3152 [I.V.:2342.2; NG/GT:310; IV Piggyback:499.9] Out: 1205 [Urine:105; Emesis/NG output:1100]   Intake/Output this shift:  Total I/O In: 92.3 [I.V.:92.3] Out: -   Frail elderly lady intubated nonresponsive CVS- RRR no rubs or gallops audible JVP not elevated RS-conical breath sounds throughout somewhat rhonchorous ABD-hypoactive bowel sounds EXT- no edema   Basic Metabolic Panel: Recent Labs  Lab 02/27/19 0330 02/27/19 2005 02/28/19 0522 03/01/19 0417  03/02/19 0455  03/02/19 1637 03/03/19 0108 03/03/19 0227 03/03/19 0353 03/04/19 0231 03/04/19 0303  NA 144  --  149* 149*   < > 149*   < > 146* 148* 146* 148* 148* 150*  K 3.6  --  2.9* 4.4   < > 4.9   < > 4.9 5.4* 4.8 5.6* 4.1 4.2  CL 112*  --  115* 121*  --  118*  --  119*  --  119*  --  120*  --   CO2 20*  --  22 21*  --  26  --  17*  --  17*  --  22  --   GLUCOSE 146*  --  160* 150*  --  118*  --  113*  --  175*  --  144*  --   BUN 48*  --  56* 64*  --  72*  --  77*  --  81*  --  91*  --   CREATININE 3.03*  --  2.64* 2.78*  --  3.01*  --  3.43*  --  3.77*  --  3.99*  --   CALCIUM 8.1*  --  8.5* 8.3*  --  8.3*  --  8.3*  --  8.2*  --  8.5*  --   MG 2.4 2.3  --  2.4  --   --   --   --   --  2.3  --  2.6*  --   PHOS 2.8 2.2* 2.6  --   --   --   --   --   --  4.1  --  3.9  --    < > = values in this interval not displayed.    Liver Function Tests: Recent Labs  Lab 02/26/19 0556 02/27/19 0330 02/28/19 0522  03/02/19 0455 03/04/19 0231  AST 287* 215*  --  70* 41  ALT 81* 57*  --  <5 <5  ALKPHOS 124 140*  --  145* 113  BILITOT 2.9* 2.9*  --  1.9* 2.1*  PROT 4.9* 5.1*  --  4.1* 4.1*  ALBUMIN 2.4* 2.6* 2.7* 2.0* 2.0*   No results for input(s): LIPASE, AMYLASE in the last 168 hours. Recent Labs  Lab 02/27/19 0330 03/02/19 0455 03/03/19 0227  AMMONIA 55* 66* 43*    CBC: Recent Labs  Lab 03/02/19 0455  03/02/19 0655 03/02/19 1637 03/03/19 0108 03/03/19 0227 03/03/19 0353 03/04/19 0231 03/04/19 0303  WBC 31.4*  --  31.8* 39.1*  --  40.3*  --  43.5*  --   HGB 6.8*   < > 6.8* 8.7* 9.5* 8.3* 7.8* 8.0* 19.0*  HCT 20.9*   < > 21.0* 26.6* 28.0* 25.5* 23.0* 24.2* 56.0*  MCV 136.6*  --  137.3* 112.7*  --  114.9*  --  113.6*  --   PLT 28*  --  36* 25*  --  25*  --  23*  --    < > = values in this interval not displayed.    Cardiac Enzymes: No results for input(s): CKTOTAL, CKMB, CKMBINDEX, TROPONINI in the last 168 hours.  BNP: Invalid input(s): POCBNP  CBG: Recent Labs  Lab 03/03/19 1547 03/03/19 1957 03/04/19 0019 03/04/19 0424 03/04/19 0803  GLUCAP 172* 150* 120* 119* 58    Microbiology: Results for orders placed or performed during the hospital encounter of 03/24/2019  Urine culture     Status: Abnormal   Collection Time: 03/24/2019  5:04 PM  Result Value Ref Range Status   Specimen Description URINE, RANDOM  Final   Special Requests   Final    NONE Performed at [facility], [place] [address] [place], [place] [address]    Culture >=100,000 COLONIES/mL ESCHERICHIA COLI (A)  Final   Report Status 02/26/2019 FINAL  Final   Organism ID, Bacteria ESCHERICHIA COLI (A)  Final      Susceptibility   Escherichia coli - MIC*    AMPICILLIN >=32 RESISTANT Resistant     CEFAZOLIN >=64 RESISTANT Resistant     CEFTRIAXONE <=1 SENSITIVE Sensitive     CIPROFLOXACIN <=0.25 SENSITIVE Sensitive     GENTAMICIN <=1 SENSITIVE Sensitive     IMIPENEM <=0.25  SENSITIVE Sensitive      NITROFURANTOIN <=16 SENSITIVE Sensitive     TRIMETH/SULFA <=20 SENSITIVE Sensitive     AMPICILLIN/SULBACTAM >=32 RESISTANT Resistant     PIP/TAZO <=4 SENSITIVE Sensitive     Extended ESBL NEGATIVE Sensitive     * >=100,000 COLONIES/mL ESCHERICHIA COLI  Blood culture (routine x 2)     Status: Abnormal   Collection Time: 03/08/2019  6:20 PM  Result Value Ref Range Status   Specimen Description BLOOD LEFT ARM  Final   Special Requests   Final    BOTTLES DRAWN AEROBIC ONLY Blood Culture adequate volume   Culture  Setup Time   Final    GRAM POSITIVE COCCI IN CLUSTERS AEROBIC BOTTLE ONLY CRITICAL VALUE NOTED.  VALUE IS CONSISTENT WITH PREVIOUSLY REPORTED AND CALLED VALUE.    Culture (A)  Final    STAPHYLOCOCCUS AUREUS SUSCEPTIBILITIES PERFORMED ON PREVIOUS CULTURE WITHIN THE LAST 5 DAYS. Performed at [facility], [place] [address] [place], [place] [address]    Report Status 02/27/2019 FINAL  Final  Blood culture (routine x 2)     Status: Abnormal   Collection Time: 03/22/2019  6:22 PM  Result Value Ref Range Status   Specimen Description BLOOD RIGHT ANTECUBITAL  Final   Special Requests   Final    BOTTLES DRAWN AEROBIC AND ANAEROBIC Blood Culture results may not be optimal due to an inadequate volume of blood received in culture bottles   Culture  Setup Time   Final    GRAM POSITIVE COCCI IN CLUSTERS IN BOTH AEROBIC AND ANAEROBIC BOTTLES CRITICAL RESULT CALLED TO, READ BACK BY AND VERIFIED WITH: PHRMD D HANNA '@[id number]'  02/25/2019 BY S GEZAHEGN Performed at [facility], [place] [address] [place], [place] [address]    Culture STAPHYLOCOCCUS AUREUS (A)  Final   Report Status 02/27/2019 FINAL  Final   Organism ID, Bacteria STAPHYLOCOCCUS AUREUS  Final      Susceptibility   Staphylococcus aureus - MIC*    CIPROFLOXACIN <=0.5 SENSITIVE Sensitive     ERYTHROMYCIN 1 INTERMEDIATE Intermediate     GENTAMICIN <=0.5 SENSITIVE Sensitive     OXACILLIN <=0.25 SENSITIVE Sensitive      TETRACYCLINE <=1 SENSITIVE Sensitive     VANCOMYCIN <=0.5 SENSITIVE Sensitive     TRIMETH/SULFA <=10 SENSITIVE Sensitive     CLINDAMYCIN <=0.25 SENSITIVE Sensitive     RIFAMPIN <=0.5 SENSITIVE Sensitive     Inducible Clindamycin NEGATIVE Sensitive     * STAPHYLOCOCCUS AUREUS  Blood Culture ID Panel (Reflexed)     Status: Abnormal   Collection Time: 02/23/2019  6:22 PM  Result Value Ref Range Status   Enterococcus species NOT DETECTED NOT DETECTED Final   Listeria monocytogenes NOT DETECTED NOT DETECTED Final   Staphylococcus species DETECTED (A) NOT DETECTED Final    Comment: CRITICAL RESULT CALLED TO, READ BACK BY AND VERIFIED WITH: PHRMD D HANNA '@[id number]'  02/25/2019 BY S GEZAHEGN    Staphylococcus aureus (BCID) DETECTED (A) NOT DETECTED Final    Comment: Methicillin (oxacillin) susceptible Staphylococcus aureus (MSSA). Preferred therapy is anti staphylococcal beta lactam antibiotic (Cefazolin or Nafcillin), unless clinically contraindicated. CRITICAL RESULT CALLED TO, READ BACK BY AND VERIFIED WITH: PHRMD D HANNA '@[id number]'  02/25/2019 BY S GEZAHEGN    Methicillin resistance NOT DETECTED NOT DETECTED Final   Streptococcus species NOT DETECTED NOT DETECTED Final   Streptococcus agalactiae NOT DETECTED NOT DETECTED Final   Streptococcus pneumoniae NOT DETECTED NOT DETECTED Final   Streptococcus pyogenes NOT DETECTED  NOT DETECTED Final   Acinetobacter baumannii NOT DETECTED NOT DETECTED Final   Enterobacteriaceae species NOT DETECTED NOT DETECTED Final   Enterobacter cloacae complex NOT DETECTED NOT DETECTED Final   Escherichia coli NOT DETECTED NOT DETECTED Final   Klebsiella oxytoca NOT DETECTED NOT DETECTED Final   Klebsiella pneumoniae NOT DETECTED NOT DETECTED Final   Proteus species NOT DETECTED NOT DETECTED Final   Serratia marcescens NOT DETECTED NOT DETECTED Final   Haemophilus influenzae NOT DETECTED NOT DETECTED Final   Neisseria meningitidis NOT DETECTED NOT DETECTED Final    Pseudomonas aeruginosa NOT DETECTED NOT DETECTED Final   Candida albicans NOT DETECTED NOT DETECTED Final   Candida glabrata NOT DETECTED NOT DETECTED Final   Candida krusei NOT DETECTED NOT DETECTED Final   Candida parapsilosis NOT DETECTED NOT DETECTED Final   Candida tropicalis NOT DETECTED NOT DETECTED Final    Comment: Performed at [facility], [place] [address] [place], [place] [address]  MRSA PCR Screening     Status: None   Collection Time: 02/25/19 12:58 AM  Result Value Ref Range Status   MRSA by PCR NEGATIVE NEGATIVE Final    Comment:        The GeneXpert MRSA Assay (FDA approved for NASAL specimens only), is one component of a comprehensive MRSA colonization surveillance program. It is not intended to diagnose MRSA infection nor to guide or monitor treatment for MRSA infections. Performed at [facility], [place] [address] [place], [place] [address]   Culture, blood (routine x 2)     Status: None   Collection Time: 02/26/19  5:56 AM  Result Value Ref Range Status   Specimen Description BLOOD LEFT ARM  Final   Special Requests   Final    BOTTLES DRAWN AEROBIC ONLY Blood Culture adequate volume   Culture   Final    NO GROWTH 5 DAYS Performed at [facility], [place] [address], [place], [place] [address]    Report Status 03/03/2019 FINAL  Final  Culture, blood (routine x 2)     Status: None   Collection Time: 02/26/19  5:56 AM  Result Value Ref Range Status   Specimen Description BLOOD LEFT HAND  Final   Special Requests   Final    BOTTLES DRAWN AEROBIC ONLY Blood Culture results may not be optimal due to an inadequate volume of blood received in culture bottles   Culture   Final    NO GROWTH 5 DAYS Performed at [facility], [place] [address] [place], [place] [address]    Report Status 03/03/2019 FINAL  Final  Culture, respiratory (non-expectorated)     Status: Abnormal (Preliminary result)   Collection Time: 03/02/19  3:18 AM  Result Value Ref  Range Status   Specimen Description TRACHEAL ASPIRATE  Final   Special Requests NONE  Final   Gram Stain   Final    MODERATE WBC PRESENT, PREDOMINANTLY PMN MODERATE YEAST WITH PSEUDOHYPHAE Performed at [facility], [place] [address], [place], [place] [address]    Culture (A)  Final    FUNGUS (MOLD) ISOLATED, PROBABLE CONTAMINANT/COLONIZER (SAPROPHYTE). CONTACT MICROBIOLOGY IF FURTHER IDENTIFICATION REQUIRED [phone].   Report Status PENDING  Incomplete    Coagulation Studies: No results for input(s): LABPROT, INR in the last 72 hours.  Urinalysis: No results for input(s): COLORURINE, LABSPEC, PHURINE, GLUCOSEU, HGBUR, BILIRUBINUR, KETONESUR, PROTEINUR, UROBILINOGEN, NITRITE, LEUKOCYTESUR in the last 72 hours.  Invalid input(s): APPERANCEUR    Imaging: Ct  Head Wo Contrast  Result Date: 03/02/2019 CLINICAL DATA:  Altered mental status EXAM: CT HEAD WITHOUT CONTRAST TECHNIQUE: Contiguous axial images were obtained from the base of the skull through the vertex without intravenous contrast. COMPARISON:  Head CT 03/15/2019 FINDINGS: Brain: Streak artifact from a left cochlear implant again partially obscures the brain. No hemorrhage or other extra-axial collection is visible. Normal appearance of brain parenchyma. Vascular: No abnormal hyperdensity of the major intracranial arteries or dural venous sinuses. No intracranial atherosclerosis. Skull: The visualized skull base, calvarium are normal. The right parotid gland is enlarged. Sinuses/Orbits: No fluid levels or advanced mucosal thickening of the visualized paranasal sinuses. Postsurgical changes of the left mastoid. The orbits are normal. IMPRESSION: 1. Streak artifact from left cochlear implant partially obscures the brain, the within that limitation there is no acute intracranial abnormality. 2. Enlarged right parotid gland, incompletely visualized. Electronically Signed   By: Ulyses Jarred M.D.   On: 03/02/2019 15:37   Dg Chest  Port 1 View  Result Date: 03/04/2019 CLINICAL DATA:  Hypoxia EXAM: PORTABLE CHEST 1 VIEW COMPARISON:  March 03, 2019 FINDINGS: Endotracheal tube tip is 2.9 cm above the carina. Central catheter tip is in the right atrium. Nasogastric tube tip and side port are below the diaphragm. No pneumothorax. There is patchy airspace consolidation in the right lower lobe region, stable. There is stable bibasilar atelectasis. Heart size and pulmonary vascularity are normal. No adenopathy. There is aortic atherosclerosis. No bone lesions. IMPRESSION: Tube and catheter positions as described without pneumothorax. Persistent airspace opacity felt to represent pneumonia right lower lobe. Bibasilar atelectasis. Stable cardiac silhouette. Aortic Atherosclerosis (ICD10-I70.0). Electronically Signed   By: Lowella Grip III M.D.   On: 03/04/2019 07:43   Dg Chest Port 1 View  Result Date: 03/03/2019 CLINICAL DATA:  Intubation EXAM: PORTABLE CHEST 1 VIEW COMPARISON:  03/02/2019, 02/28/2019, 02/26/2019 FINDINGS: Endotracheal tube tip is just above the carina. Esophageal tube tip below the diaphragm but non included. Left IJ central venous catheter tip over the mid right atrium. Worsening airspace disease at the right infrahilar lung. Persistent airspace disease at the left base. Probable small pleural effusions. Stable cardiomediastinal silhouette with aortic atherosclerosis. Linear scarring or atelectasis in the right upper lobe. IMPRESSION: 1. Endotracheal tube tip just above the carina. 2. Left IJ central venous catheter tip projects over the mid right atrium 3. Worsening airspace disease in the right infrahilar lung and lung base. Suspected trace pleural effusions and left basilar airspace disease are otherwise unchanged Electronically Signed   By: Donavan Foil M.D.   On: 03/03/2019 01:20     Medications:   . sodium chloride    . dextrose 30 mL/hr at 03/04/19 0800  . feeding supplement (JEVITY 1.2 CAL) Stopped  (03/02/19 0330)  . fentaNYL infusion INTRAVENOUS    . norepinephrine (LEVOPHED) Adult infusion 4 mcg/min (03/04/19 0800)  . vasopressin (PITRESSIN) infusion - *FOR SHOCK* 0.03 Units/min (03/04/19 0800)   . ceFEPime (MAXIPIME) IV  500 mg Intravenous Q24H  . chlorhexidine gluconate (MEDLINE KIT)  15 mL Mouth Rinse BID  . Chlorhexidine Gluconate Cloth  6 each Topical Q0600  . insulin aspart  0-9 Units Subcutaneous Q4H  . lactulose  30 g Per Tube TID  . levothyroxine  37.5 mcg Per Tube Daily  . mouth rinse  15 mL Mouth Rinse 10 times per day  . pantoprazole (PROTONIX) IV  40 mg Intravenous Q24H  . rifaximin  400 mg Oral Q8H  . sodium bicarbonate  650  mg Per Tube TID   Place/Maintain arterial line **AND** sodium chloride, acetaminophen **OR** acetaminophen, dextrose, docusate, fentaNYL, fentaNYL (SUBLIMAZE) injection, Gerhardt's butt cream, [DISCONTINUED] ondansetron **OR** ondansetron (ZOFRAN) IV  Assessment/ Plan:   Acute on chronic kidney injury stage IV baseline poor candidate for long-term dialysis.  Is been evaluated at [facility] in the past.  It appears that she has become increasingly more volume overloaded with worsening serum creatinine and oliguria.  Palliative care have been recommended.  Discussed with family and will not escalate care to place patient on CRRT.  Discussed with Dr. Nelda Marseille who is in agreement  Hyperkalemia patient now being resuscitated with IV pressors now intubated.  Appears to have resolved with use of Kayexalate.  Continue temporizing measures  Hypotension and shock.  Patient now continues on Levophed although it appears her requirements have decreased and vasopressin has been added.  Prognosis is very poor.  Cirrhosis with ascites and encephalopathy continues on lactulose and rifaximin.  MSSA bacteremia  continue cefepime 500 mg daily  Aspiration pneumonia being treated with cefepime 500 mg daily infectious disease appreciate  assistance  Anemia per critical care transfusion 03/02/2019  Hypothyroidism continue Synthroid 37.5 mg daily  Hyponatremia.  Free water added per primary care team.  She has a pretty significant free water deficit of at least 3 L.  D5W started at 30 mils an hour.  Would increase to 125 cc an hour.  Disposition patient's prognosis appears to be poor.  We will continue to follow with critical care   LOS: [place] '@TODAY' '@9' :01 AM

## 2019-03-05 LAB — CBC
HCT: 23.6 % — ABNORMAL LOW (ref 36.0–46.0)
Hemoglobin: 7.5 g/dL — ABNORMAL LOW (ref 12.0–15.0)
MCH: 37.3 pg — ABNORMAL HIGH (ref 26.0–34.0)
MCHC: 31.8 g/dL (ref 30.0–36.0)
MCV: 117.4 fL — ABNORMAL HIGH (ref 80.0–100.0)
PLATELETS: 20 10*3/uL — AB (ref 150–400)
RBC: 2.01 MIL/uL — ABNORMAL LOW (ref 3.87–5.11)
RDW: 36.6 % — ABNORMAL HIGH (ref 11.5–15.5)
WBC: 42.1 10*3/uL — ABNORMAL HIGH (ref 4.0–10.5)
nRBC: 12 % — ABNORMAL HIGH (ref 0.0–0.2)

## 2019-03-05 LAB — COMPREHENSIVE METABOLIC PANEL
ALT: <5 U/L (ref 0–44)
AST: 41 U/L (ref 15–41)
Albumin: 1.8 g/dL — ABNORMAL LOW (ref 3.5–5.0)
Alkaline Phosphatase: 110 U/L (ref 38–126)
Anion gap: 9 (ref 5–15)
BUN: 94 mg/dL — ABNORMAL HIGH (ref 8–23)
CO2: 18 mmol/L — AB (ref 22–32)
Calcium: 8.2 mg/dL — ABNORMAL LOW (ref 8.9–10.3)
Chloride: 118 mmol/L — ABNORMAL HIGH (ref 98–111)
Creatinine, Ser: 4.17 mg/dL — ABNORMAL HIGH (ref 0.44–1.00)
GFR calc Af Amer: 11 mL/min — ABNORMAL LOW (ref >60)
GFR calc non Af Amer: 10 mL/min — ABNORMAL LOW (ref >60)
Glucose, Bld: 136 mg/dL — ABNORMAL HIGH (ref 70–99)
Potassium: 3.9 mmol/L (ref 3.5–5.1)
Sodium: 145 mmol/L (ref 135–145)
Total Bilirubin: 2 mg/dL — ABNORMAL HIGH (ref 0.3–1.2)
Total Protein: 4 g/dL — ABNORMAL LOW (ref 6.5–8.1)

## 2019-03-05 LAB — GLUCOSE, CAPILLARY
Glucose-Capillary: 108 mg/dL — ABNORMAL HIGH (ref 70–99)
Glucose-Capillary: 120 mg/dL — ABNORMAL HIGH (ref 70–99)
Glucose-Capillary: 123 mg/dL — ABNORMAL HIGH (ref 70–99)
Glucose-Capillary: 127 mg/dL — ABNORMAL HIGH (ref 70–99)
Glucose-Capillary: 206 mg/dL — ABNORMAL HIGH (ref 70–99)

## 2019-03-05 LAB — CULTURE, RESPIRATORY W GRAM STAIN

## 2019-03-05 LAB — AMMONIA: Ammonia: 31 umol/L (ref 9–35)

## 2019-03-05 LAB — CULTURE, RESPIRATORY

## 2019-03-05 MED ORDER — CHLORHEXIDINE GLUCONATE 0.12% ORAL RINSE (MEDLINE KIT)
15.0000 mL | Freq: Two times a day (BID) | OROMUCOSAL | Status: DC
  Administered 2019-03-05 – 2019-03-06 (×2): 15 mL via OROMUCOSAL

## 2019-03-05 MED ORDER — LACTULOSE 10 GM/15ML PO SOLN
20.0000 g | Freq: Three times a day (TID) | ORAL | Status: DC
  Administered 2019-03-05 – 2019-03-06 (×2): 20 g via ORAL
  Filled 2019-03-05 (×3): qty 30

## 2019-03-05 MED ORDER — INSULIN ASPART 100 UNIT/ML ~~LOC~~ SOLN
0.0000 [IU] | SUBCUTANEOUS | Status: DC
  Administered 2019-03-05: 1 [IU] via SUBCUTANEOUS

## 2019-03-05 MED ORDER — ORAL CARE MOUTH RINSE
15.0000 mL | OROMUCOSAL | Status: DC
  Administered 2019-03-05 – 2019-03-06 (×7): 15 mL via OROMUCOSAL

## 2019-03-05 MED ORDER — SODIUM BICARBONATE 650 MG PO TABS
650.0000 mg | ORAL_TABLET | Freq: Two times a day (BID) | ORAL | Status: DC
  Administered 2019-03-05 – 2019-03-06 (×2): 650 mg
  Filled 2019-03-05 (×2): qty 1

## 2019-03-05 MED ORDER — CHLORHEXIDINE GLUCONATE CLOTH 2 % EX PADS
6.0000 | MEDICATED_PAD | Freq: Every day | CUTANEOUS | Status: DC
  Administered 2019-03-06: 6 via TOPICAL

## 2019-03-05 MED ORDER — RIFAXIMIN 200 MG PO TABS
400.0000 mg | ORAL_TABLET | Freq: Three times a day (TID) | ORAL | Status: DC
  Administered 2019-03-05 – 2019-03-06 (×3): 400 mg via ORAL
  Filled 2019-03-05 (×4): qty 2

## 2019-03-05 NOTE — Progress Notes (Signed)
[name] ROUNDING NOTE   Subjective:   73 year old lady with history of hypertension hypothyroidism liver cirrhosis and chronic kidney disease stage IV baseline creatinine about 2.5-3.0 she was admitted with MSSA bacteremia and acute hypoxic respiratory failure with worsening encephalopathy,.  Her renal function appears to be relatively stable.  Creatinine was 2.8 on admission. renal ultrasound showed no hydronephrosis but bilateral renal cortical atrophy.  On 03/02/2019 she underwent intubation and placement of triple-lumen catheter.  Discussion with daughter at bedside patient is known to me from my outpatient practice and we have discussed dialysis.  She is not a candidate for dialysis and does not want to have dialysis.  Reiterated that dialysis would not be indicated.  Patient making no progress in ICU.  Blood pressure 151/46 pulse 76 temperature 96.2    Oliguric urine output 75 cc.  NG output 450 cc cc.  Weight 63.4 kg.  Transfused 1 unit packed red blood cells 03/02/2019  Sodium 145 potassium 3.9 chloride 118 CO2 18 BUN 94 creatinine 4.17 glucose 136 calcium 8.2 albumin 1.8 WBC 42.1 hemoglobin 7.5 platelets 20   Medications cefepime 500 mg daily, rifaximin 550 mg twice daily, Levophed titrated between 0 and 40 mcg/min.,  Vasopressin 0.03 units/min, Synthroid 37.5 mcg daily, Protonix 40 mg daily, lactulose 30cc 3 times daily.  Sodium bicarbonate 650 mg 3 times daily Dextrose 5% started 75 cc an hour.  Serum sodium is improved to 145.  CT abdomen 02/25/2019 with hepatic cirrhosis colonic wall thickening of the ascending colon advanced aortobiiliac atherosclerosis abdominal ascites.  Air noted in stool throughout possibly due to colonic pneumatosis or suspicion for ischemia  CT scan of head 03/02/2019 straight cath artifact from left cochlear implant and enlarged right parotid gland no acute intracranial abnormality  Sedated still requiring high doses FiO2 now at  40%.    Objective:  Vital signs in last 24 hours:  Temp:  [94.2 F (34.6 C)-97.6 F (36.4 C)] 96.2 F (35.7 C) (03/12 0740) Pulse Rate:  [54-85] 73 (03/12 0914) Resp:  [11-24] 21 (03/12 0914) BP: (77-130)/(45-75) 81/45 (03/12 0914) SpO2:  [81 %-100 %] 98 % (03/12 0914) Arterial Line BP: (102-144)/(37-48) 119/41 (03/12 0700) FiO2 (%):  [40 %-50 %] 40 % (03/12 0914) Weight:  [63.4 kg] 63.4 kg (03/12 0500)  Weight change: 0.8 kg Filed Weights   03/03/19 0447 03/04/19 0404 03/05/19 0500  Weight: 62.8 kg 62.6 kg 63.4 kg    Intake/Output: I/O last 3 completed shifts: In: 3083.7 [I.V.:2713.7; NG/GT:370] Out: 795 [Urine:95; Emesis/NG output:700]   Intake/Output this shift:  No intake/output data recorded.  Frail elderly lady intubated nonresponsive CVS- RRR no rubs or gallops audible JVP not elevated RS-conical breath sounds throughout somewhat rhonchorous ABD-hypoactive bowel sounds EXT- no edema   Basic Metabolic Panel: Recent Labs  Lab 02/27/19 0330 02/27/19 2005 02/28/19 0522 03/01/19 0417  03/02/19 0455  03/02/19 1637  03/03/19 0227 03/03/19 0353 03/04/19 0231 03/04/19 0303 03/05/19 0408  NA 144  --  149* 149*   < > 149*   < > 146*   < > 146* 148* 148* 150* 145  K 3.6  --  2.9* 4.4   < > 4.9   < > 4.9   < > 4.8 5.6* 4.1 4.2 3.9  CL 112*  --  115* 121*  --  118*  --  119*  --  119*  --  120*  --  118*  CO2 20*  --  22 21*  --  26  --  17*  --  17*  --  22  --  18*  GLUCOSE 146*  --  160* 150*  --  118*  --  113*  --  175*  --  144*  --  136*  BUN 48*  --  56* 64*  --  72*  --  77*  --  81*  --  91*  --  94*  CREATININE 3.03*  --  2.64* 2.78*  --  3.01*  --  3.43*  --  3.77*  --  3.99*  --  4.17*  CALCIUM 8.1*  --  8.5* 8.3*  --  8.3*  --  8.3*  --  8.2*  --  8.5*  --  8.2*  MG 2.4 2.3  --  2.4  --   --   --   --   --  2.3  --  2.6*  --   --   PHOS 2.8 2.2* 2.6  --   --   --   --   --   --  4.1  --  3.9  --   --    < > = values in this interval not displayed.     Liver Function Tests: Recent Labs  Lab 02/27/19 0330 02/28/19 0522 03/02/19 0455 03/04/19 0231 03/05/19 0408  AST 215*  --  70* 41 41  ALT 57*  --  <5 <5 <5  ALKPHOS 140*  --  145* 113 110  BILITOT 2.9*  --  1.9* 2.1* 2.0*  PROT 5.1*  --  4.1* 4.1* 4.0*  ALBUMIN 2.6* 2.7* 2.0* 2.0* 1.8*   No results for input(s): LIPASE, AMYLASE in the last 168 hours. Recent Labs  Lab 03/02/19 0455 03/03/19 0227 03/05/19 0409  AMMONIA 66* 43* 31    CBC: Recent Labs  Lab 03/02/19 0655 03/02/19 1637  03/03/19 0227 03/03/19 0353 03/04/19 0231 03/04/19 0303 03/05/19 0408  WBC 31.8* 39.1*  --  40.3*  --  43.5*  --  42.1*  HGB 6.8* 8.7*   < > 8.3* 7.8* 8.0* 19.0* 7.5*  HCT 21.0* 26.6*   < > 25.5* 23.0* 24.2* 56.0* 23.6*  MCV 137.3* 112.7*  --  114.9*  --  113.6*  --  117.4*  PLT 36* 25*  --  25*  --  23*  --  20*   < > = values in this interval not displayed.    Cardiac Enzymes: No results for input(s): CKTOTAL, CKMB, CKMBINDEX, TROPONINI in the last 168 hours.  BNP: Invalid input(s): POCBNP  CBG: Recent Labs  Lab 03/04/19 1458 03/04/19 1939 03/04/19 2352 03/05/19 0351 03/05/19 0835  GLUCAP 154* 164* 115* 87* 120*    Microbiology: Results for orders placed or performed during the hospital encounter of 02/26/2019  Urine culture     Status: Abnormal   Collection Time: 03/08/2019  5:04 PM  Result Value Ref Range Status   Specimen Description URINE, RANDOM  Final   Special Requests   Final    NONE Performed at [facility], [place] [address], [place], [place] [address]    Culture >=100,000 COLONIES/mL ESCHERICHIA COLI (A)  Final   Report Status 02/26/2019 FINAL  Final   Organism ID, Bacteria ESCHERICHIA COLI (A)  Final      Susceptibility   Escherichia coli - MIC*    AMPICILLIN >=32 RESISTANT Resistant     CEFAZOLIN >=64 RESISTANT Resistant     CEFTRIAXONE <=1 SENSITIVE Sensitive     CIPROFLOXACIN <=0.25  SENSITIVE Sensitive     GENTAMICIN <=1 SENSITIVE  Sensitive     IMIPENEM <=0.25 SENSITIVE Sensitive     NITROFURANTOIN <=16 SENSITIVE Sensitive     TRIMETH/SULFA <=20 SENSITIVE Sensitive     AMPICILLIN/SULBACTAM >=32 RESISTANT Resistant     PIP/TAZO <=4 SENSITIVE Sensitive     Extended ESBL NEGATIVE Sensitive     * >=100,000 COLONIES/mL ESCHERICHIA COLI  Blood culture (routine x 2)     Status: Abnormal   Collection Time: 03/24/2019  6:20 PM  Result Value Ref Range Status   Specimen Description BLOOD LEFT ARM  Final   Special Requests   Final    BOTTLES DRAWN AEROBIC ONLY Blood Culture adequate volume   Culture  Setup Time   Final    GRAM POSITIVE COCCI IN CLUSTERS AEROBIC BOTTLE ONLY CRITICAL VALUE NOTED.  VALUE IS CONSISTENT WITH PREVIOUSLY REPORTED AND CALLED VALUE.    Culture (A)  Final    STAPHYLOCOCCUS AUREUS SUSCEPTIBILITIES PERFORMED ON PREVIOUS CULTURE WITHIN THE LAST 5 DAYS. Performed at [facility], [place] [address], [place], [place] [address]    Report Status 02/27/2019 FINAL  Final  Blood culture (routine x 2)     Status: Abnormal   Collection Time: 03/20/2019  6:22 PM  Result Value Ref Range Status   Specimen Description BLOOD RIGHT ANTECUBITAL  Final   Special Requests   Final    BOTTLES DRAWN AEROBIC AND ANAEROBIC Blood Culture results may not be optimal due to an inadequate volume of blood received in culture bottles   Culture  Setup Time   Final    GRAM POSITIVE COCCI IN CLUSTERS IN BOTH AEROBIC AND ANAEROBIC BOTTLES CRITICAL RESULT CALLED TO, READ BACK BY AND VERIFIED WITH: PHRMD D HANNA '@[id number]'  02/25/2019 BY S GEZAHEGN Performed at [facility], [place] [address] [place], [place] [address]    Culture STAPHYLOCOCCUS AUREUS (A)  Final   Report Status 02/27/2019 FINAL  Final   Organism ID, Bacteria STAPHYLOCOCCUS AUREUS  Final      Susceptibility   Staphylococcus aureus - MIC*    CIPROFLOXACIN <=0.5 SENSITIVE Sensitive     ERYTHROMYCIN 1 INTERMEDIATE Intermediate     GENTAMICIN <=0.5 SENSITIVE Sensitive      OXACILLIN <=0.25 SENSITIVE Sensitive     TETRACYCLINE <=1 SENSITIVE Sensitive     VANCOMYCIN <=0.5 SENSITIVE Sensitive     TRIMETH/SULFA <=10 SENSITIVE Sensitive     CLINDAMYCIN <=0.25 SENSITIVE Sensitive     RIFAMPIN <=0.5 SENSITIVE Sensitive     Inducible Clindamycin NEGATIVE Sensitive     * STAPHYLOCOCCUS AUREUS  Blood Culture ID Panel (Reflexed)     Status: Abnormal   Collection Time: 03/06/2019  6:22 PM  Result Value Ref Range Status   Enterococcus species NOT DETECTED NOT DETECTED Final   Listeria monocytogenes NOT DETECTED NOT DETECTED Final   Staphylococcus species DETECTED (A) NOT DETECTED Final    Comment: CRITICAL RESULT CALLED TO, READ BACK BY AND VERIFIED WITH: PHRMD D HANNA '@1142'  02/25/2019 BY S GEZAHEGN    Staphylococcus aureus (BCID) DETECTED (A) NOT DETECTED Final    Comment: Methicillin (oxacillin) susceptible Staphylococcus aureus (MSSA). Preferred therapy is anti staphylococcal beta lactam antibiotic (Cefazolin or Nafcillin), unless clinically contraindicated. CRITICAL RESULT CALLED TO, READ BACK BY AND VERIFIED WITH: PHRMD D HANNA '@1142'  02/25/2019 BY S GEZAHEGN    Methicillin resistance NOT DETECTED NOT DETECTED Final   Streptococcus species NOT DETECTED NOT DETECTED Final   Streptococcus agalactiae NOT DETECTED NOT DETECTED  Final   Streptococcus pneumoniae NOT DETECTED NOT DETECTED Final   Streptococcus pyogenes NOT DETECTED NOT DETECTED Final   Acinetobacter baumannii NOT DETECTED NOT DETECTED Final   Enterobacteriaceae species NOT DETECTED NOT DETECTED Final   Enterobacter cloacae complex NOT DETECTED NOT DETECTED Final   Escherichia coli NOT DETECTED NOT DETECTED Final   Klebsiella oxytoca NOT DETECTED NOT DETECTED Final   Klebsiella pneumoniae NOT DETECTED NOT DETECTED Final   Proteus species NOT DETECTED NOT DETECTED Final   Serratia marcescens NOT DETECTED NOT DETECTED Final   Haemophilus influenzae NOT DETECTED NOT DETECTED Final   Neisseria meningitidis  NOT DETECTED NOT DETECTED Final   Pseudomonas aeruginosa NOT DETECTED NOT DETECTED Final   Candida albicans NOT DETECTED NOT DETECTED Final   Candida glabrata NOT DETECTED NOT DETECTED Final   Candida krusei NOT DETECTED NOT DETECTED Final   Candida parapsilosis NOT DETECTED NOT DETECTED Final   Candida tropicalis NOT DETECTED NOT DETECTED Final    Comment: Performed at [facility], [place] [address], [place], [place] [address]  MRSA PCR Screening     Status: None   Collection Time: 02/25/19 12:58 AM  Result Value Ref Range Status   MRSA by PCR NEGATIVE NEGATIVE Final    Comment:        The GeneXpert MRSA Assay (FDA approved for NASAL specimens only), is one component of a comprehensive MRSA colonization surveillance program. It is not intended to diagnose MRSA infection nor to guide or monitor treatment for MRSA infections. Performed at [facility], [place] [address], [place], [place] [address]   Culture, blood (routine x 2)     Status: None   Collection Time: 02/26/19  5:56 AM  Result Value Ref Range Status   Specimen Description BLOOD LEFT ARM  Final   Special Requests   Final    BOTTLES DRAWN AEROBIC ONLY Blood Culture adequate volume   Culture   Final    NO GROWTH 5 DAYS Performed at [facility], [place] [address] [place], [place] [address]    Report Status 03/03/2019 FINAL  Final  Culture, blood (routine x 2)     Status: None   Collection Time: 02/26/19  5:56 AM  Result Value Ref Range Status   Specimen Description BLOOD LEFT HAND  Final   Special Requests   Final    BOTTLES DRAWN AEROBIC ONLY Blood Culture results may not be optimal due to an inadequate volume of blood received in culture bottles   Culture   Final    NO GROWTH 5 DAYS Performed at [facility], [place] [address], [place], [place] [address]    Report Status 03/03/2019 FINAL  Final  Culture, respiratory (non-expectorated)     Status: Abnormal   Collection Time: 03/02/19  3:18 AM   Result Value Ref Range Status   Specimen Description TRACHEAL ASPIRATE  Final   Special Requests NONE  Final   Gram Stain   Final    MODERATE WBC PRESENT, PREDOMINANTLY PMN MODERATE YEAST WITH PSEUDOHYPHAE Performed at [place] [address] [place], [place] [address]    Culture (A)  Final    FUNGUS (MOLD) ISOLATED, PROBABLE CONTAMINANT/COLONIZER (SAPROPHYTE). CONTACT MICROBIOLOGY IF FURTHER IDENTIFICATION REQUIRED [phone]. FEW CANDIDA GLABRATA    Report Status 03/05/2019 FINAL  Final    Coagulation Studies: No results for input(s): LABPROT, INR in the last 72 hours.  Urinalysis: No results for input(s): COLORURINE, LABSPEC, [place], [place], [place], [place],  KETONESUR, PROTEINUR, UROBILINOGEN, NITRITE, LEUKOCYTESUR in the last 72 hours.  Invalid input(s): APPERANCEUR    Imaging: Dg Chest Port 1 View  Result Date: 03/04/2019 CLINICAL DATA:  Hypoxia EXAM: PORTABLE CHEST 1 VIEW COMPARISON:  March 03, 2019 FINDINGS: Endotracheal tube tip is 2.9 cm above the carina. Central catheter tip is in the right atrium. Nasogastric tube tip and side port are below the diaphragm. No pneumothorax. There is patchy airspace consolidation in the right lower lobe region, stable. There is stable bibasilar atelectasis. Heart size and pulmonary vascularity are normal. No adenopathy. There is aortic atherosclerosis. No bone lesions. IMPRESSION: Tube and catheter positions as described without pneumothorax. Persistent airspace opacity felt to represent pneumonia right lower lobe. Bibasilar atelectasis. Stable cardiac silhouette. Aortic Atherosclerosis (ICD10-I70.0). Electronically Signed   By: Lowella Grip III M.D.   On: 03/04/2019 07:43     Medications:   . sodium chloride    . dextrose 75 mL/hr at 03/05/19 0700  . feeding supplement (JEVITY 1.2 CAL) 1,000 mL (03/04/19 1149)  . fentaNYL infusion INTRAVENOUS    . norepinephrine (LEVOPHED) Adult infusion 6 mcg/min (03/05/19  0700)  . vasopressin (PITRESSIN) infusion - *FOR SHOCK* 0.03 Units/min (03/05/19 0700)   . ceFEPime (MAXIPIME) IV  500 mg Intravenous Q24H  . chlorhexidine gluconate (MEDLINE KIT)  15 mL Mouth Rinse BID  . Chlorhexidine Gluconate Cloth  6 each Topical Q0600  . insulin aspart  0-9 Units Subcutaneous Q4H  . lactulose  30 g Per Tube TID  . levothyroxine  37.5 mcg Per Tube Daily  . mouth rinse  15 mL Mouth Rinse 10 times per day  . pantoprazole (PROTONIX) IV  40 mg Intravenous Q24H  . rifaximin  400 mg Oral Q8H  . sodium bicarbonate  650 mg Per Tube BID   Place/Maintain arterial line **AND** sodium chloride, acetaminophen **OR** acetaminophen, dextrose, docusate, fentaNYL, fentaNYL (SUBLIMAZE) injection, Gerhardt's butt cream, [DISCONTINUED] ondansetron **OR** ondansetron (ZOFRAN) IV  Assessment/ Plan:   Acute on chronic kidney injury stage IV baseline poor candidate for long-term dialysis.  Is been evaluated at [facility] in the past.  It appears that she has become increasingly more volume overloaded with worsening serum creatinine and oliguria.  Palliative care have been recommended.  Discussed with family and will not escalate care to place patient on CRRT.  Discussed with Dr. Nelda Marseille who is in agreement.  Family wished to give another 24 to 48 hours to see if there is any improvement.  I think this is reasonable.  They however understand a very poor prognosis.  Hyperkalemia patient now being resuscitated with IV pressors now intubated.  Appears to have resolved with use of Kayexalate.  Continue temporizing measures  Hypotension and shock.  Patient now continues on Levophed although it appears her requirements have decreased and vasopressin has been added.  Prognosis is very poor.  Cirrhosis with ascites and encephalopathy continues on lactulose and rifaximin.  MSSA bacteremia  continue cefepime 500 mg daily  Aspiration pneumonia being treated with cefepime 500 mg daily  infectious disease appreciate assistance  Anemia per critical care transfusion 03/02/2019  Hypothyroidism continue Synthroid 37.5 mg daily  Hypernatremia.  Free water added per primary care team.  She had a pretty significant free water deficit of at least 3 L.  Started D5W at 75 cc an hour appears to be correcting well  Disposition patient's prognosis appears to be poor.  We will continue to follow with critical care.  Family wished to give another  24 hours before withdrawing care.  This seems reasonable   LOS: [place] '@TODAY' '@11' :26 AM

## 2019-03-05 NOTE — Progress Notes (Signed)
NAME:  [name], MRN:  [id number], DOB:  02-05-1946, LOS: 9 ADMISSION DATE:  03/18/2019, CONSULTATION DATE:  02/25/19 REFERRING MD:  Alfredia Ferguson  CHIEF COMPLAINT:  AMS   Brief History   73 yo female presented with altered mental status and elevated ammonia level and lactic acidosis.  Found to have liver cirrhosis and possible bowel ischemia.  Had progressive lactic acidosis and altered mental status and transferred to ICU.  Past Medical History  HTN, liver disease, hypothyroidism, CKD IV, Anemia.  Significant Hospital Events   3/3 > admit. 3/4 > transfer to ICU. 3/8 > mental status worse 3/9 intubated for inability to protect her airway 3/10 seen by nephrology.  Renal function continues to decline.  Acid-base worse.  Not HD candidate. Added cefepime for PNA. Spoke w/ family. DNR if arrests. Cont medical management for now.  3/11: still pressor dependent but decreased. cxr about same. Renal fxn a little worse. Starting trickle feed.  Consults:  Nephrology ID Hematology PCCM  Procedures:  TTE 3/05 >> EF greater that 48%, mod RV systolic dysfx, no vegetations  Significant Diagnostic Tests:  CT head 3/3 > left sided cochlear implant. CT MXLFCL 3/3 > changes of parotitis on the right without visible abscess. RUQ [place] 3/3 > cirrhosis with ascites.  Biliary sludge without signs of acute cholecystitis. CT A / P 3/4 > hepatic cirrhosis with moderate ascites, colonic wall thickening of the ascending colon.  Air in stool throughout possibly due to colonic pneumatosis and suspicious for ischemia.  Pelvic floor descent with rectocele. CT right femur 3/4 > neg.  Micro Data:  Blood 3/3 > Staph aureus Urine 3/3 > E coli  Antimicrobials:  Vanc 3/4 > 3/4 Cefepime 3/4 > 3/4  Flagyl 3/4 > 3/4 Ancef 3/4 > 3/10 Cefepime 3/10>>  Interim history/subjective:  No events overnight  Objective:  Blood pressure (!) 105/56, pulse 73, temperature (!) 96.2 F (35.7 C), temperature source Axillary, resp.  rate 14, height $RemoveBe'5\' 1"'cNEzqCINd$  (1.549 m), weight 63.4 kg, SpO2 (!) 89 %.    Vent Mode: PRVC FiO2 (%):  [40 %-60 %] 60 % Set Rate:  [20 bmp] 20 bmp Vt Set:  [400 mL] 400 mL PEEP:  [5 cmH20] 5 cmH20 Pressure Support:  [10 cmH20] 10 cmH20 Plateau Pressure:  [13 cmH20-17 cmH20] 13 cmH20   Intake/Output Summary (Last 24 hours) at 03/05/2019 1346 Last data filed at 03/05/2019 1300 Gross per 24 hour  Intake 2535.35 ml  Output 350 ml  Net 2185.35 ml   Filed Weights   03/03/19 0447 03/04/19 0404 03/05/19 0500  Weight: 62.8 kg 62.6 kg 63.4 kg   Examination:  General: Alert and interactive this AM HEENT: [place]/AT, PERRL, EOM-I and MMM Pulmonary: Coarse BS diffusely Cardiac: RRR, Nl S1/S2 and -M/R/G Abdomen: Soft, NT, ND and +BS Extremities: diffuse edema Neurologic: Awake and interactive, moving ext to command GU: Concentrated yellow urine  Resolved issues  Lactic acidosis  Assessment & Plan:   Acute hepatic encephalopathy in setting of sepsis. Cirrhosis with ascites. Mental status progressively worse. Plan Hold rifaxamin post extubation D/C lactulose after extubation D/C ammonia level draws Supportive care only  Severe sepsis/septic shock  with MSSA bacteremia and new aspiration PNA (3/9) Continues to have significantly elevated white blood cell count Plan Changed to cefepime 3/10 to cover for aspiration and bacteremia, will continue for now Cont current pressors for MAP goal > 65 No further escalation (not adding more pressors) No role for TEE unless has recovery (which seems unlikely)  Tele monitoring  Acute hypoxic respiratory failure secondary to compromised airway.. Further complicated by aspiration PNA pcxr personally reviewed and c/w prior film from day before. ETT good position as is CVL. Persistent RLL airspace disease w/out sig change  Plan Extubate today without intention to reintubate when family is ready PAD protocol RASS goal 0 VAP bundle  Abx per above  Acute on  chronic renal failure with history of CKD 4, appears to be headed towards end-stage renal disease Plan Cont supportive care Not HD candidate  AM labs ordered  Fluid and electrolyte imbalance: Hypernatremia, hyperchloremia,Non-gap metabolic acidosis -renal fxn worse. Acid base and K improved.  Plan Cont free water; will increase d5w to 75 cc/hr (per nephrology recs) D/C bicarb AM chemistry  Anemia of critical illness, iron deficiency and chronic disease. Thrombocytopenia, most likely from sepsis and cirrhosis. Plan Transfuse for hgb < 7 and PLTs < 10 Trend cbc   Hx of hypothyroidism. Plan Cont synthroid  Hypoglycemia, now hyperglycemia Moderate protein calorie malnutrition. Plan Holding tubefeeds d/t N/V  Hx HTN. Plan Holding diuretics and antihypertensives   Goals of care. Plan DNR  Spoke family at length bedside, after a long discussion decision was made to proceed with one way extubation, they understand that the patient will eventually fail and at that point proceed with comfort care.  No further escalation of care.  The patient is critically ill with multiple organ systems failure and requires high complexity decision making for assessment and support, frequent evaluation and titration of therapies, application of advanced monitoring technologies and extensive interpretation of multiple databases.   Critical Care Time devoted to patient care services described in this note is  36  Minutes. This time reflects time of care of this signee Dr Jennet Maduro. This critical care time does not reflect procedure time, or teaching time or supervisory time of PA/NP/Med student/Med Resident etc but could involve care discussion time.  Rush Farmer, M.D. [facility]. Pager: [phone]. After hours pager: [phone].

## 2019-03-05 NOTE — Progress Notes (Signed)
[facility]   Reason for visit: Follow up on MSSA bacteremia  Interval History: remains intubated, sedated, WBC remains high, remains afebrile.  Family members at bedside.     Physical Exam: Constitutional:  Vitals:   03/05/19 0740 03/05/19 0914  BP:  (!) 81/45  Pulse:  73  Resp:  (!) 21  Temp: (!) 96.2 F (35.7 C)   SpO2:  98%   patient is sedated Eyes: anicteric HENT: +ET Respiratory: respiratory effort on vent; decreased wheezes Cardiovascular: Tachy RR GI: soft, nt, nd  Review of Systems: Unable to be assessed due to patient factors  Lab Results  Component Value Date   WBC 42.1 (H) 03/05/2019   HGB 7.5 (L) 03/05/2019   HCT 23.6 (L) 03/05/2019   MCV 117.4 (H) 03/05/2019   PLT 20 (LL) 03/05/2019    Lab Results  Component Value Date   CREATININE 4.17 (H) 03/05/2019   BUN 94 (H) 03/05/2019   NA 145 03/05/2019   K 3.9 03/05/2019   CL 118 (H) 03/05/2019   CO2 18 (L) 03/05/2019    Lab Results  Component Value Date   ALT <5 03/05/2019   AST 41 03/05/2019   ALKPHOS 110 03/05/2019     Microbiology: Recent Results (from the past 240 hour(s))  Urine culture     Status: Abnormal   Collection Time: 03/21/2019  5:04 PM  Result Value Ref Range Status   Specimen Description URINE, RANDOM  Final   Special Requests   Final    NONE Performed at [facility], [place] [address] [place], [place] [address]    Culture >=100,000 COLONIES/mL ESCHERICHIA COLI (A)  Final   Report Status 02/26/2019 FINAL  Final   Organism ID, Bacteria ESCHERICHIA COLI (A)  Final      Susceptibility   Escherichia coli - MIC*    AMPICILLIN >=32 RESISTANT Resistant     CEFAZOLIN >=64 RESISTANT Resistant     CEFTRIAXONE <=1 SENSITIVE Sensitive     CIPROFLOXACIN <=0.25 SENSITIVE Sensitive     GENTAMICIN <=1 SENSITIVE Sensitive     IMIPENEM <=0.25 SENSITIVE Sensitive     NITROFURANTOIN <=16 SENSITIVE Sensitive     TRIMETH/SULFA <=20 SENSITIVE Sensitive    AMPICILLIN/SULBACTAM >=32 RESISTANT Resistant     PIP/TAZO <=4 SENSITIVE Sensitive     Extended ESBL NEGATIVE Sensitive     * >=100,000 COLONIES/mL ESCHERICHIA COLI  Blood culture (routine x 2)     Status: Abnormal   Collection Time: 03/18/2019  6:20 PM  Result Value Ref Range Status   Specimen Description BLOOD LEFT ARM  Final   Special Requests   Final    BOTTLES DRAWN AEROBIC ONLY Blood Culture adequate volume   Culture  Setup Time   Final    GRAM POSITIVE COCCI IN CLUSTERS AEROBIC BOTTLE ONLY CRITICAL VALUE NOTED.  VALUE IS CONSISTENT WITH PREVIOUSLY REPORTED AND CALLED VALUE.    Culture (A)  Final    STAPHYLOCOCCUS AUREUS SUSCEPTIBILITIES PERFORMED ON PREVIOUS CULTURE WITHIN THE LAST 5 DAYS. Performed at [facility], [place] [address], [place], [place] [address]    Report Status 02/27/2019 FINAL  Final  Blood culture (routine x 2)     Status: Abnormal   Collection Time: 03/21/2019  6:22 PM  Result Value Ref Range Status   Specimen Description BLOOD RIGHT ANTECUBITAL  Final   Special Requests   Final    BOTTLES DRAWN AEROBIC AND ANAEROBIC Blood Culture results may  not be optimal due to an inadequate volume of blood received in culture bottles   Culture  Setup Time   Final    GRAM POSITIVE COCCI IN CLUSTERS IN BOTH AEROBIC AND ANAEROBIC BOTTLES CRITICAL RESULT CALLED TO, READ BACK BY AND VERIFIED WITH: PHRMD D HANNA $Remov'@1142'[email or web address]$  02/25/2019 BY S GEZAHEGN Performed at [facility], [place] [address] [place], [place] [address]    Culture STAPHYLOCOCCUS AUREUS (A)  Final   Report Status 02/27/2019 FINAL  Final   Organism ID, Bacteria STAPHYLOCOCCUS AUREUS  Final      Susceptibility   Staphylococcus aureus - MIC*    CIPROFLOXACIN <=0.5 SENSITIVE Sensitive     ERYTHROMYCIN 1 INTERMEDIATE Intermediate     GENTAMICIN <=0.5 SENSITIVE Sensitive     OXACILLIN <=0.25 SENSITIVE Sensitive     TETRACYCLINE <=1 SENSITIVE Sensitive     VANCOMYCIN <=0.5 SENSITIVE Sensitive     TRIMETH/SULFA  <=10 SENSITIVE Sensitive     CLINDAMYCIN <=0.25 SENSITIVE Sensitive     RIFAMPIN <=0.5 SENSITIVE Sensitive     Inducible Clindamycin NEGATIVE Sensitive     * STAPHYLOCOCCUS AUREUS  Blood Culture ID Panel (Reflexed)     Status: Abnormal   Collection Time: 02/22/2019  6:22 PM  Result Value Ref Range Status   Enterococcus species NOT DETECTED NOT DETECTED Final   Listeria monocytogenes NOT DETECTED NOT DETECTED Final   Staphylococcus species DETECTED (A) NOT DETECTED Final    Comment: CRITICAL RESULT CALLED TO, READ BACK BY AND VERIFIED WITH: PHRMD D HANNA $Remov'@1142'[email or web address]$  02/25/2019 BY S GEZAHEGN    Staphylococcus aureus (BCID) DETECTED (A) NOT DETECTED Final    Comment: Methicillin (oxacillin) susceptible Staphylococcus aureus (MSSA). Preferred therapy is anti staphylococcal beta lactam antibiotic (Cefazolin or Nafcillin), unless clinically contraindicated. CRITICAL RESULT CALLED TO, READ BACK BY AND VERIFIED WITH: PHRMD D HANNA $Remov'@1142'[email or web address]$  02/25/2019 BY S GEZAHEGN    Methicillin resistance NOT DETECTED NOT DETECTED Final   Streptococcus species NOT DETECTED NOT DETECTED Final   Streptococcus agalactiae NOT DETECTED NOT DETECTED Final   Streptococcus pneumoniae NOT DETECTED NOT DETECTED Final   Streptococcus pyogenes NOT DETECTED NOT DETECTED Final   Acinetobacter baumannii NOT DETECTED NOT DETECTED Final   Enterobacteriaceae species NOT DETECTED NOT DETECTED Final   Enterobacter cloacae complex NOT DETECTED NOT DETECTED Final   Escherichia coli NOT DETECTED NOT DETECTED Final   Klebsiella oxytoca NOT DETECTED NOT DETECTED Final   Klebsiella pneumoniae NOT DETECTED NOT DETECTED Final   Proteus species NOT DETECTED NOT DETECTED Final   Serratia marcescens NOT DETECTED NOT DETECTED Final   Haemophilus influenzae NOT DETECTED NOT DETECTED Final   Neisseria meningitidis NOT DETECTED NOT DETECTED Final   Pseudomonas aeruginosa NOT DETECTED NOT DETECTED Final   Candida albicans NOT DETECTED NOT DETECTED Final    Candida glabrata NOT DETECTED NOT DETECTED Final   Candida krusei NOT DETECTED NOT DETECTED Final   Candida parapsilosis NOT DETECTED NOT DETECTED Final   Candida tropicalis NOT DETECTED NOT DETECTED Final    Comment: Performed at [facility], [place]. [address], [place], [place] [address]  MRSA PCR Screening     Status: None   Collection Time: 02/25/19 12:58 AM  Result Value Ref Range Status   MRSA by PCR NEGATIVE NEGATIVE Final    Comment:        The GeneXpert MRSA Assay (FDA approved for NASAL specimens only), is one component of a comprehensive MRSA colonization surveillance program. It is not intended to diagnose MRSA infection  nor to guide or monitor treatment for MRSA infections. Performed at [facility], [place] [address], [place], [place] [address]   Culture, blood (routine x 2)     Status: None   Collection Time: 02/26/19  5:56 AM  Result Value Ref Range Status   Specimen Description BLOOD LEFT ARM  Final   Special Requests   Final    BOTTLES DRAWN AEROBIC ONLY Blood Culture adequate volume   Culture   Final    NO GROWTH 5 DAYS Performed at [facility], [place] [address], [place], [place] [address]    Report Status 03/03/2019 FINAL  Final  Culture, blood (routine x 2)     Status: None   Collection Time: 02/26/19  5:56 AM  Result Value Ref Range Status   Specimen Description BLOOD LEFT HAND  Final   Special Requests   Final    BOTTLES DRAWN AEROBIC ONLY Blood Culture results may not be optimal due to an inadequate volume of blood received in culture bottles   Culture   Final    NO GROWTH 5 DAYS Performed at [facility], [place] [address], [place], [place] [address]    Report Status 03/03/2019 FINAL  Final  Culture, respiratory (non-expectorated)     Status: Abnormal   Collection Time: 03/02/19  3:18 AM  Result Value Ref Range Status   Specimen Description TRACHEAL ASPIRATE  Final   Special Requests NONE  Final   Gram Stain   Final    MODERATE  WBC PRESENT, PREDOMINANTLY PMN MODERATE YEAST WITH PSEUDOHYPHAE Performed at [place] [address] [place], [place] [address]    Culture (A)  Final    FUNGUS (MOLD) ISOLATED, PROBABLE CONTAMINANT/COLONIZER (SAPROPHYTE). CONTACT MICROBIOLOGY IF FURTHER IDENTIFICATION REQUIRED [phone]. FEW CANDIDA GLABRATA    Report Status 03/05/2019 FINAL  Final    Impression/Plan:  1. MSSA bacteremia - continues on cefepime.  TEE when able Repeat cultures ngtd  2.  Pneumonia - broadened to cefepime  3.  Respiratory failure - Remains intubated.  Remains on pressor support

## 2019-03-06 LAB — TYPE AND SCREEN
ABO/RH(D): O POS
Antibody Screen: NEGATIVE
Unit division: 0
Unit division: 0

## 2019-03-06 LAB — BPAM RBC
Blood Product Expiration Date: 202004072359
Blood Product Expiration Date: 202004072359
ISSUE DATE / TIME: 202003091151
Unit Type and Rh: 5100
Unit Type and Rh: 5100

## 2019-03-06 LAB — GLUCOSE, CAPILLARY
Glucose-Capillary: 112 mg/dL — ABNORMAL HIGH (ref 70–99)
Glucose-Capillary: 116 mg/dL — ABNORMAL HIGH (ref 70–99)
Glucose-Capillary: 49 mg/dL — ABNORMAL LOW (ref 70–99)

## 2019-03-06 MED ORDER — PHENOL 1.4 % MT LIQD
1.0000 | OROMUCOSAL | Status: DC | PRN
  Filled 2019-03-06: qty 177

## 2019-03-06 NOTE — Progress Notes (Signed)
[facility] ROUNDING NOTE   Subjective:   73 year old lady with history of hypertension hypothyroidism liver cirrhosis and chronic kidney disease stage IV baseline creatinine about 2.5-3.0 she was admitted with MSSA bacteremia and acute hypoxic respiratory failure with worsening encephalopathy,.  Her renal function appears to be relatively stable.  Creatinine was 2.8 on admission. renal ultrasound showed no hydronephrosis but bilateral renal cortical atrophy.  On 03/02/2019 she underwent intubation and placement of triple-lumen catheter.  Daughter at bedside.  No new questions.  Planning terminal extubation.  Patient is comfortable  Blood pressure 99/51 pulse 91 temperature 97.7 O2 sats 93% was intubated on ventilator when seen this morning 60% FiO2.  Now on nonrebreather 15 L/min  Oliguric renal failure no urine output.  400 cc of emesis weight 66 kg admission weight 57 kg  No labs this morning.  Planning for terminal extubation  Medications cefepime 500 mg daily, rifaximin 550 mg twice daily, Levophed titrated between 0 and 40 mcg/min.,  Vasopressin 0.03 units/min, Synthroid 37.5 mcg daily, Protonix 40 mg daily, lactulose 30cc 3 times daily.  Sodium bicarbonate 650 mg 3 times daily Dextrose 5% started 75 cc an hour.  Serum sodium is improved to 145.  CT abdomen 02/25/2019 with hepatic cirrhosis colonic wall thickening of the ascending colon advanced aortobiiliac atherosclerosis abdominal ascites.  Air noted in stool throughout possibly due to colonic pneumatosis or suspicion for ischemia  CT scan of head 03/02/2019 straight cath artifact from left cochlear implant and enlarged right parotid gland no acute intracranial abnormality  Planning for terminal extubation    Objective:  Vital signs in last 24 hours:  Temp:  [97.5 F (36.4 C)-97.9 F (36.6 C)] 97.7 F (36.5 C) (03/13 0700) Pulse Rate:  [65-91] 91 (03/13 0845) Resp:  [0-25] 17 (03/13 0845) BP: (81-112)/(45-64) 104/56  (03/13 0700) SpO2:  [85 %-100 %] 93 % (03/13 0845) Arterial Line BP: (122-154)/(39-54) 138/44 (03/13 0700) FiO2 (%):  [40 %-60 %] 60 % (03/13 0313) Weight:  [66 kg] 66 kg (03/13 0500)  Weight change: 2.6 kg Filed Weights   03/04/19 0404 03/05/19 0500 03/06/19 0500  Weight: 62.6 kg 63.4 kg 66 kg    Intake/Output: I/O last 3 completed shifts: In: 3689.2 [I.V.:3379.2; NG/GT:210; IV Piggyback:100] Out: 450 [Emesis/NG output:450]   Intake/Output this shift:  No intake/output data recorded.  Frail elderly lady intubated nonresponsive CVS- RRR no rubs or gallops audible JVP not elevated RS-conical breath sounds throughout somewhat rhonchorous ABD-hypoactive bowel sounds EXT- no edema   Basic Metabolic Panel: Recent Labs  Lab 02/27/19 2005  02/28/19 0522 03/01/19 0417  03/02/19 0455  03/02/19 1637  03/03/19 0227 03/03/19 0353 03/04/19 0231 03/04/19 0303 03/05/19 0408  NA  --    < > 149* 149*   < > 149*   < > 146*   < > 146* 148* 148* 150* 145  K  --    < > 2.9* 4.4   < > 4.9   < > 4.9   < > 4.8 5.6* 4.1 4.2 3.9  CL  --    < > 115* 121*  --  118*  --  119*  --  119*  --  120*  --  118*  CO2  --    < > 22 21*  --  26  --  17*  --  17*  --  22  --  18*  GLUCOSE  --    < > 160* 150*  --  118*  --  113*  --  175*  --  144*  --  136*  BUN  --    < > 56* 64*  --  72*  --  77*  --  81*  --  91*  --  94*  CREATININE  --    < > 2.64* 2.78*  --  3.01*  --  3.43*  --  3.77*  --  3.99*  --  4.17*  CALCIUM  --    < > 8.5* 8.3*  --  8.3*  --  8.3*  --  8.2*  --  8.5*  --  8.2*  MG 2.3  --   --  2.4  --   --   --   --   --  2.3  --  2.6*  --   --   PHOS 2.2*  --  2.6  --   --   --   --   --   --  4.1  --  3.9  --   --    < > = values in this interval not displayed.    Liver Function Tests: Recent Labs  Lab 02/28/19 0522 03/02/19 0455 03/04/19 0231 03/05/19 0408  AST  --  70* 41 41  ALT  --  <5 <5 <5  ALKPHOS  --  145* 113 110  BILITOT  --  1.9* 2.1* 2.0*  PROT  --  4.1* 4.1*  4.0*  ALBUMIN 2.7* 2.0* 2.0* 1.8*   No results for input(s): LIPASE, AMYLASE in the last 168 hours. Recent Labs  Lab 03/02/19 0455 03/03/19 0227 03/05/19 0409  AMMONIA 66* 43* 31    CBC: Recent Labs  Lab 03/02/19 0655 03/02/19 1637  03/03/19 0227 03/03/19 0353 03/04/19 0231 03/04/19 0303 03/05/19 0408  WBC 31.8* 39.1*  --  40.3*  --  43.5*  --  42.1*  HGB 6.8* 8.7*   < > 8.3* 7.8* 8.0* 19.0* 7.5*  HCT 21.0* 26.6*   < > 25.5* 23.0* 24.2* 56.0* 23.6*  MCV 137.3* 112.7*  --  114.9*  --  113.6*  --  117.4*  PLT 36* 25*  --  25*  --  23*  --  20*   < > = values in this interval not displayed.    Cardiac Enzymes: No results for input(s): CKTOTAL, CKMB, CKMBINDEX, TROPONINI in the last 168 hours.  BNP: Invalid input(s): POCBNP  CBG: Recent Labs  Lab 03/05/19 1453 03/05/19 1953 03/05/19 2330 03/06/19 0310 03/06/19 0751  GLUCAP 206* 127* 108* 112* 116*    Microbiology: Results for orders placed or performed during the hospital encounter of 02/28/2019  Urine culture     Status: Abnormal   Collection Time: 03/09/2019  5:04 PM  Result Value Ref Range Status   Specimen Description URINE, RANDOM  Final   Special Requests   Final    NONE Performed at [facility], [place] [address], [place], [place] [address]    Culture >=100,000 COLONIES/mL ESCHERICHIA COLI (A)  Final   Report Status 02/26/2019 FINAL  Final   Organism ID, Bacteria ESCHERICHIA COLI (A)  Final      Susceptibility   Escherichia coli - MIC*    AMPICILLIN >=32 RESISTANT Resistant     CEFAZOLIN >=64 RESISTANT Resistant     CEFTRIAXONE <=1 SENSITIVE Sensitive     CIPROFLOXACIN <=0.25 SENSITIVE Sensitive     GENTAMICIN <=1 SENSITIVE Sensitive     IMIPENEM <=0.25 SENSITIVE Sensitive     NITROFURANTOIN <=  16 SENSITIVE Sensitive     TRIMETH/SULFA <=20 SENSITIVE Sensitive     AMPICILLIN/SULBACTAM >=32 RESISTANT Resistant     PIP/TAZO <=4 SENSITIVE Sensitive     Extended ESBL NEGATIVE Sensitive     *  >=100,000 COLONIES/mL ESCHERICHIA COLI  Blood culture (routine x 2)     Status: Abnormal   Collection Time: 03/13/2019  6:20 PM  Result Value Ref Range Status   Specimen Description BLOOD LEFT ARM  Final   Special Requests   Final    BOTTLES DRAWN AEROBIC ONLY Blood Culture adequate volume   Culture  Setup Time   Final    GRAM POSITIVE COCCI IN CLUSTERS AEROBIC BOTTLE ONLY CRITICAL VALUE NOTED.  VALUE IS CONSISTENT WITH PREVIOUSLY REPORTED AND CALLED VALUE.    Culture (A)  Final    STAPHYLOCOCCUS AUREUS SUSCEPTIBILITIES PERFORMED ON PREVIOUS CULTURE WITHIN THE LAST 5 DAYS. Performed at [facility], [place] [address], [place], [place] [address]    Report Status 02/27/2019 FINAL  Final  Blood culture (routine x 2)     Status: Abnormal   Collection Time: 03/09/2019  6:22 PM  Result Value Ref Range Status   Specimen Description BLOOD RIGHT ANTECUBITAL  Final   Special Requests   Final    BOTTLES DRAWN AEROBIC AND ANAEROBIC Blood Culture results may not be optimal due to an inadequate volume of blood received in culture bottles   Culture  Setup Time   Final    GRAM POSITIVE COCCI IN CLUSTERS IN BOTH AEROBIC AND ANAEROBIC BOTTLES CRITICAL RESULT CALLED TO, READ BACK BY AND VERIFIED WITH: PHRMD D HANNA _0  02/25/2019 BY S GEZAHEGN Performed at [facility], [place] [address], [place], [place] [address]    Culture STAPHYLOCOCCUS AUREUS (A)  Final   Report Status 02/27/2019 FINAL  Final   Organism ID, Bacteria STAPHYLOCOCCUS AUREUS  Final      Susceptibility   Staphylococcus aureus - MIC*    CIPROFLOXACIN <=0.5 SENSITIVE Sensitive     ERYTHROMYCIN 1 INTERMEDIATE Intermediate     GENTAMICIN <=0.5 SENSITIVE Sensitive     OXACILLIN <=0.25 SENSITIVE Sensitive     TETRACYCLINE <=1 SENSITIVE Sensitive     VANCOMYCIN <=0.5 SENSITIVE Sensitive     TRIMETH/SULFA <=10 SENSITIVE Sensitive     CLINDAMYCIN <=0.25 SENSITIVE Sensitive     RIFAMPIN <=0.5 SENSITIVE Sensitive     Inducible  Clindamycin NEGATIVE Sensitive     * STAPHYLOCOCCUS AUREUS  Blood Culture ID Panel (Reflexed)     Status: Abnormal   Collection Time: 02/27/2019  6:22 PM  Result Value Ref Range Status   Enterococcus species NOT DETECTED NOT DETECTED Final   Listeria monocytogenes NOT DETECTED NOT DETECTED Final   Staphylococcus species DETECTED (A) NOT DETECTED Final    Comment: CRITICAL RESULT CALLED TO, READ BACK BY AND VERIFIED WITH: PHRMD D HANNA _1  02/25/2019 BY S GEZAHEGN    Staphylococcus aureus (BCID) DETECTED (A) NOT DETECTED Final    Comment: Methicillin (oxacillin) susceptible Staphylococcus aureus (MSSA). Preferred therapy is anti staphylococcal beta lactam antibiotic (Cefazolin or Nafcillin), unless clinically contraindicated. CRITICAL RESULT CALLED TO, READ BACK BY AND VERIFIED WITH: PHRMD D HANNA _2  02/25/2019 BY S GEZAHEGN    Methicillin resistance NOT DETECTED NOT DETECTED Final   Streptococcus species NOT DETECTED NOT DETECTED Final   Streptococcus agalactiae NOT DETECTED NOT DETECTED Final   Streptococcus pneumoniae NOT DETECTED NOT DETECTED Final   Streptococcus pyogenes NOT DETECTED NOT DETECTED Final   Acinetobacter baumannii  NOT DETECTED NOT DETECTED Final   Enterobacteriaceae species NOT DETECTED NOT DETECTED Final   Enterobacter cloacae complex NOT DETECTED NOT DETECTED Final   Escherichia coli NOT DETECTED NOT DETECTED Final   Klebsiella oxytoca NOT DETECTED NOT DETECTED Final   Klebsiella pneumoniae NOT DETECTED NOT DETECTED Final   Proteus species NOT DETECTED NOT DETECTED Final   Serratia marcescens NOT DETECTED NOT DETECTED Final   Haemophilus influenzae NOT DETECTED NOT DETECTED Final   Neisseria meningitidis NOT DETECTED NOT DETECTED Final   Pseudomonas aeruginosa NOT DETECTED NOT DETECTED Final   Candida albicans NOT DETECTED NOT DETECTED Final   Candida glabrata NOT DETECTED NOT DETECTED Final   Candida krusei NOT DETECTED NOT DETECTED Final   Candida parapsilosis  NOT DETECTED NOT DETECTED Final   Candida tropicalis NOT DETECTED NOT DETECTED Final    Comment: Performed at [facility], [place] [address] [place], [place] [address]  MRSA PCR Screening     Status: None   Collection Time: 02/25/19 12:58 AM  Result Value Ref Range Status   MRSA by PCR NEGATIVE NEGATIVE Final    Comment:        The GeneXpert MRSA Assay (FDA approved for NASAL specimens only), is one component of a comprehensive MRSA colonization surveillance program. It is not intended to diagnose MRSA infection nor to guide or monitor treatment for MRSA infections. Performed at [facility], [place] [address], [place], [place] [address]   Culture, blood (routine x 2)     Status: None   Collection Time: 02/26/19  5:56 AM  Result Value Ref Range Status   Specimen Description BLOOD LEFT ARM  Final   Special Requests   Final    BOTTLES DRAWN AEROBIC ONLY Blood Culture adequate volume   Culture   Final    NO GROWTH 5 DAYS Performed at [facility], [place] [address], [place], [place] [address]    Report Status 03/03/2019 FINAL  Final  Culture, blood (routine x 2)     Status: None   Collection Time: 02/26/19  5:56 AM  Result Value Ref Range Status   Specimen Description BLOOD LEFT HAND  Final   Special Requests   Final    BOTTLES DRAWN AEROBIC ONLY Blood Culture results may not be optimal due to an inadequate volume of blood received in culture bottles   Culture   Final    NO GROWTH 5 DAYS Performed at [facility], [place] [address], [place], [place] [address]    Report Status 03/03/2019 FINAL  Final  Culture, respiratory (non-expectorated)     Status: Abnormal   Collection Time: 03/02/19  3:18 AM  Result Value Ref Range Status   Specimen Description TRACHEAL ASPIRATE  Final   Special Requests NONE  Final   Gram Stain   Final    MODERATE WBC PRESENT, PREDOMINANTLY PMN MODERATE YEAST WITH PSEUDOHYPHAE Performed at [facility] [address],  [place],  [address]    Culture (A)  Final    FUNGUS (MOLD) ISOLATED, PROBABLE CONTAMINANT/COLONIZER (SAPROPHYTE). CONTACT MICROBIOLOGY IF FURTHER IDENTIFICATION REQUIRED [phone]. FEW CANDIDA GLABRATA    Report Status 03/05/2019 FINAL  Final    Coagulation Studies: No results for input(s): LABPROT, INR in the last 72 hours.  Urinalysis: No results for input(s): COLORURINE, LABSPEC, PHURINE, GLUCOSEU, HGBUR, BILIRUBINUR, KETONESUR, PROTEINUR, UROBILINOGEN, NITRITE, LEUKOCYTESUR in the last 72 hours.  Invalid input(s): APPERANCEUR    Imaging: No results found.  Medications:   . sodium chloride    . dextrose 75 mL/hr at 03/06/19 0806  . fentaNYL infusion INTRAVENOUS    . norepinephrine (LEVOPHED) Adult infusion 10 mcg/min (03/06/19 0700)  . vasopressin (PITRESSIN) infusion - *FOR SHOCK* 0.03 Units/min (03/06/19 0700)   . ceFEPime (MAXIPIME) IV  500 mg Intravenous Q24H  . chlorhexidine gluconate (MEDLINE KIT)  15 mL Mouth Rinse BID  . Chlorhexidine Gluconate Cloth  6 each Topical Q0600  . insulin aspart  0-9 Units Subcutaneous Q4H  . lactulose  20 g Oral TID  . levothyroxine  37.5 mcg Per Tube Daily  . mouth rinse  15 mL Mouth Rinse 10 times per day  . pantoprazole (PROTONIX) IV  40 mg Intravenous Q24H  . rifaximin  400 mg Oral Q8H  . sodium bicarbonate  650 mg Per Tube BID   [CANCELED] Place/Maintain arterial line **AND** sodium chloride, acetaminophen **OR** acetaminophen, fentaNYL, fentaNYL (SUBLIMAZE) injection, Gerhardt's butt cream, [DISCONTINUED] ondansetron **OR** ondansetron (ZOFRAN) IV  Assessment/ Plan:   Acute on chronic kidney injury stage IV baseline poor candidate for long-term dialysis.  Is been evaluated at [facility] in the past.  It appears that she has become increasingly more volume overloaded with worsening serum creatinine and oliguria.  Palliative care have been recommended.  Discussed with family and will not escalate care to  place patient on CRRT.  Patient now on nonrebreather 15 L/min  Hyperkalemia patient now being resuscitated with IV pressors now intubated.  Appears to have resolved with use of Kayexalate.  Hypotension and shock.  Continues on Levophed and vasopressin .  Now extubated.  Cirrhosis with ascites and encephalopathy continues on lactulose and rifaximin.  MSSA bacteremia  continue cefepime 500 mg daily  Aspiration pneumonia being treated with cefepime 500 mg daily infectious disease appreciate assistance  Anemia per critical care transfusion 03/02/2019  Hypothyroidism Synthroid 37.5 mcg daily  Hypernatremia.  Resolved  Disposition patient's prognosis appears to be poor.  Family seem happy with decision to terminally extubate patient.  Explained process to daughter and she is appreciative of care rendered   LOS: [place] _0 _1 :49 AM

## 2019-03-06 NOTE — Progress Notes (Signed)
Nutrition Brief Note  Chart reviewed. Plans for extubation, then likely transition to comfort care. TF has been discontinued. No further nutrition interventions warranted at this time.  Please re-consult as needed.   Molli Barrows, RD, LDN, [place] Pager [phone] After Hours Pager [phone]

## 2019-03-06 NOTE — Procedures (Signed)
Extubation Procedure Note  Patient Details:   Name: [name] DOB: 1946/02/20 MRN: [id number]   Airway Documentation:    Vent end date: 03/06/19 Vent end time: 0840   Evaluation  O2 sats: currently acceptable Complications: No apparent complications Patient did tolerate procedure well. Bilateral Breath Sounds: Clear, Diminished   No   One way extubation per MD order at family's request  Kathie Dike 03/06/2019, 8:46 AM

## 2019-03-06 NOTE — Progress Notes (Signed)
NAME:  [name], MRN:  [id number], DOB:  [birth year], LOS: 74 ADMISSION DATE:  03/17/2019, CONSULTATION DATE:  02/25/19 REFERRING MD:  Alfredia Ferguson  CHIEF COMPLAINT:  AMS   Brief History   73 yo female presented with altered mental status and elevated ammonia level and lactic acidosis.  Found to have liver cirrhosis and possible bowel ischemia.  Had progressive lactic acidosis and altered mental status and transferred to ICU.  Past Medical History  HTN, liver disease, hypothyroidism, CKD IV, Anemia.  Significant Hospital Events   3/3 > admit. 3/4 > transfer to ICU. 3/8 > mental status worse 3/9 intubated for inability to protect her airway 3/10 seen by nephrology.  Renal function continues to decline.  Acid-base worse.  Not HD candidate. Added cefepime for PNA. Spoke w/ family. DNR if arrests. Cont medical management for now.  3/11: still pressor dependent but decreased. cxr about same. Renal fxn a little worse. Starting trickle feed.  Consults:  Nephrology ID Hematology PCCM  Procedures:  TTE 3/05 >> EF greater that 33%, mod RV systolic dysfx, no vegetations  Significant Diagnostic Tests:  CT head 3/3 > left sided cochlear implant. CT MXLFCL 3/3 > changes of parotitis on the right without visible abscess. RUQ [place] 3/3 > cirrhosis with ascites.  Biliary sludge without signs of acute cholecystitis. CT A / P 3/4 > hepatic cirrhosis with moderate ascites, colonic wall thickening of the ascending colon.  Air in stool throughout possibly due to colonic pneumatosis and suspicious for ischemia.  Pelvic floor descent with rectocele. CT right femur 3/4 > neg.  Micro Data:  Blood 3/3 > Staph aureus Urine 3/3 > E coli  Antimicrobials:  Vanc 3/4 > 3/4 Cefepime 3/4 > 3/4  Flagyl 3/4 > 3/4 Ancef 3/4 > 3/10 Cefepime 3/10>>  Interim history/subjective:  No events overnight, no new complaints  Objective:  Blood pressure 115/64, pulse 90, temperature 97.7 F (36.5 C), temperature source Oral,  resp. rate (!) 22, height $RemoveBe'5\' 1"'hWmgFfYaJ$  (1.549 m), weight 66 kg, SpO2 99 %.    Vent Mode: PRVC FiO2 (%):  [40 %-60 %] 60 % Set Rate:  [20 bmp] 20 bmp Vt Set:  [400 mL] 400 mL PEEP:  [5 cmH20] 5 cmH20 Plateau Pressure:  [18 cmH20] 18 cmH20   Intake/Output Summary (Last 24 hours) at 03/06/2019 1017 Last data filed at 03/06/2019 1000 Gross per 24 hour  Intake 2705.63 ml  Output 200 ml  Net 2505.63 ml   Filed Weights   03/04/19 0404 03/05/19 0500 03/06/19 0500  Weight: 62.6 kg 63.4 kg 66 kg   Examination:  General: Chronically ill appearing female, NAD HEENT: [place]/AT, PERRL, EOM-spontaneous Pulmonary: Coarse BS diffuse Cardiac: RRR, Nl S1/S2 and -M/R/G Abdomen: Soft, distended, NT and +BS Extremities: Diffuse edema Neurologic: Awake and interactive, moving ext to command GU: Concentrated yellow urine  Resolved issues  Lactic acidosis  Assessment & Plan:   Acute hepatic encephalopathy in setting of sepsis. Cirrhosis with ascites. Mental status progressively worse. Plan Hold rifaxamin post extubation D/C lactulose D/C ammonia level draws Supportive care only  Severe sepsis/septic shock  with MSSA bacteremia and new aspiration PNA (3/9) Continues to have significantly elevated white blood cell count Plan Changed to cefepime 3/10 to cover for aspiration and bacteremia, will continue for now Cont current pressors for MAP goal > 65 No further escalation (not adding more pressors) No role for TEE unless has recovery (which seems unlikely) Tele monitoring  Acute hypoxic respiratory failure secondary to compromised airway.Marland Kitchen  Further complicated by aspiration PNA pcxr personally reviewed and c/w prior film from day before. ETT good position as is CVL. Persistent RLL airspace disease w/out sig change  Plan Extubate today without intention to reintubate when family is ready PAD protocol RASS goal 0 VAP bundle  Abx per above  Acute on chronic renal failure with history of CKD 4,  appears to be headed towards end-stage renal disease Plan Cont supportive care Not HD candidate  AM labs ordered  Fluid and electrolyte imbalance: Hypernatremia, hyperchloremia,Non-gap metabolic acidosis -renal fxn worse. Acid base and K improved.  Plan Cont free water; will increase d5w to 75 cc/hr (per nephrology recs) D/C bicarb AM chemistry  Anemia of critical illness, iron deficiency and chronic disease. Thrombocytopenia, most likely from sepsis and cirrhosis. Plan Transfuse for hgb < 7 and PLTs < 10 Trend cbc   Hx of hypothyroidism. Plan Cont synthroid  Hypoglycemia, now hyperglycemia Moderate protein calorie malnutrition. Plan Holding tubefeeds d/t N/V  Hx HTN. Plan Holding diuretics and antihypertensives   Goals of care. Plan DNR  Spoke with family, informed that patient is going to breath but not for long post extubation.  They declined morphine for now.  Will d/c pressors and the ventilator today.  Once starts to have difficult time breathing then will transition to comfort care.  The patient is critically ill with multiple organ systems failure and requires high complexity decision making for assessment and support, frequent evaluation and titration of therapies, application of advanced monitoring technologies and extensive interpretation of multiple databases.   Critical Care Time devoted to patient care services described in this note is  37  Minutes. This time reflects time of care of this signee Dr Jennet Maduro. This critical care time does not reflect procedure time, or teaching time or supervisory time of PA/NP/Med student/Med Resident etc but could involve care discussion time.  Rush Farmer, M.D. [facility] Pulmonary/Critical Care Medicine. Pager: [phone]. After hours pager: [phone].

## 2019-03-06 NOTE — Progress Notes (Signed)
Hypoglycemic Event  CBG: 49  Treatment: None. Pt is comfort care.  Symptoms: None    [name] [name]

## 2019-03-06 NOTE — Progress Notes (Signed)
Patients family requesting to speak with provider, provider paged and will come bedside.

## 2019-03-07 ENCOUNTER — Encounter (HOSPITAL_COMMUNITY): Payer: Self-pay

## 2019-03-09 ENCOUNTER — Telehealth: Payer: Self-pay | Admitting: Pulmonary Disease

## 2019-03-09 NOTE — Telephone Encounter (Signed)
03/09/19 received D/C from [facility] home. I will page Dr.McQuaid to see if he will sign for Dr. Nelda Marseille.  PWR  Received Signed C/C back from Dr. Lake Bells call [employer] [facility] to pick up. PWR

## 2019-03-19 ENCOUNTER — Ambulatory Visit: Payer: Self-pay | Admitting: Internal Medicine

## 2019-03-19 ENCOUNTER — Ambulatory Visit: Payer: Self-pay

## 2019-03-25 NOTE — Progress Notes (Signed)
   03-12-2019 0600  Clinical Encounter Type  Visited With Patient and family together  Visit Type Initial  Referral From Nurse  Consult/Referral To Chaplain  Spiritual Encounters  Spiritual Needs Grief support;Prayer;Emotional  Stress Factors  Patient Stress Factors None identified  Family Stress Factors Loss of control;Major life changes;Loss   Responded to page from floor Nurse for support for family of PT death. Family was at bedside and talking. I offered spiritual care with words of comfort, empathic listening, ministry of presence, and prayer. Family was very thankful for the Chaplain visit. Daughter stated that she will let Nurse know about their wishes for the care of the PT.  Chaplain Fidel Levy   [phone]

## 2019-03-25 NOTE — Death Summary Note (Signed)
DEATH SUMMARY   Patient Details  Name: [name] MRN: [id number] DOB: January 14, 1946  Admission/Discharge Information   Admit Date:  03/15/2019  Date of Death: Date of Death: 03-26-2019  Time of Death: Time of Death: 0432  Length of Stay: 03/23/23  Referring Physician: Glendale Chard, MD   Reason(s) for Hospitalization  Hepatic encephalopathy  Diagnoses  Preliminary cause of death:   Liver failure  Secondary Diagnoses (including complications and co-morbidities):  Principal Problem:   MSSA bacteremia Active Problems:   CKD (chronic kidney disease) stage 4, GFR 15-29 ml/min (HCC)   Hypothyroidism   Acute on chronic respiratory failure with hypoxia (HCC)   AKI (acute kidney injury) ([place])   Protein-calorie malnutrition, severe   Essential hypertension   Encephalopathy acute   Hyperammonemia (HCC)   Hypokalemia   Increased anion gap metabolic acidosis   Acute kidney injury superimposed on chronic kidney disease (HCC)   Leukocytosis   Elevated AST (SGOT)   Macrocytic anemia   Sepsis (HCC)   Cirrhosis (HCC)   Thrombocytopenia (HCC)   Aspiration into airway   Altered mental status   Acute respiratory failure with hypoxemia (HCC)   Goals of care, counseling/discussion   Acute renal failure ([place])   Chronic liver failure with hepatic coma [facility])   Palliative care encounter   Brief Hospital Course (including significant findings, care, treatment, and services provided and events leading to death)  73 yo female presented with altered mental status and elevated ammonia level and lactic acidosis.  Found to have liver cirrhosis and possible bowel ischemia.  Had progressive lactic acidosis and altered mental status and transferred to ICU.    Renal function continued to decline.  Acid-base worse.  Not HD candidate per renal. Spoke w/ family. DNR if arrests. Cont medical management for now.   Spoke with family, informed that patient is going to breath but not for long post extubation but  will eventually decline and will need to consider morphine.  They declined morphine as they would like to speak with her.  Pressors and ventilatory support were removed.  Patient subsequently decompensated, morphine was started for the patient to expire shortly thereafter.  Family notified.    Pertinent Labs and Studies  Significant Diagnostic Studies Ct Abdomen Pelvis Wo Contrast  Result Date: 02/25/2019 CLINICAL DATA:  Cirrhosis or Fatty Liver liver enzymes up in past. has never been told "cirrhosis" before. Was turned loose by GI over a year ago. Now, US shows cirrhosis. No ETOH. ammonia level up. Sepsis of unknown origin. EXAM: CT ABDOMEN AND PELVIS WITHOUT CONTRAST TECHNIQUE: Multidetector CT imaging of the abdomen and pelvis was performed following the standard protocol without IV contrast. COMPARISON:  Abdominal ultrasound earlier this day. CT 01/23/2028 FINDINGS: Lower chest: Small bilateral pleural effusions. Coronary artery calcifications. Hepatobiliary: Macrolobular hepatic contours consistent with cirrhosis. No obvious hepatic lesion on noncontrast exam. Distended gallbladder with layering hyperdensity. Motion obscures evaluation of the biliary tree. Pancreas: Motion degraded evaluation. Parenchymal atrophy. Motion versus edema at the pancreatic head, image 35 series 3. Spleen: Normal in size. No gross focal abnormality. Adrenals/Urinary Tract: Left adrenal thickening without dominant nodule. Normal right adrenal gland. Bilateral renal cortical atrophy. No hydronephrosis. Small cyst in the left kidney. Tiny hyperdensity in the upper left kidney. Previous hyperdense lesion in the lower right kidney is not as well seen, may represent resolution of a hemorrhagic cyst. Urinary bladder physiologically distended. No bladder wall thickening. Stomach/Bowel: Patient motion artifact, paucity of intra-abdominal fat, intra-abdominal ascites, and lack of  contrast significantly limits bowel assessment. Suspect  gastric wall thickening, not well assessed. No small bowel obstruction. There is air in stool throughout the colon, occasionally air appears slightly circumferential, for example at the hepatic flexure, image 6 series 3. Probable wall thickening of the ascending colon. The appendix is visualized and normal. There is pelvic floor descent with rectocele. Vascular/Lymphatic: Advanced aorta bi-iliac atherosclerosis. No portal venous or mesenteric gas. Significantly limited assessment for adenopathy. Reproductive: Atrophic uterus, normal for age. Adnexa not well assessed. Other: Pelvic floor descent. Moderate abdominopelvic ascites. No evidence for free air, however evaluation significantly limited. Musculoskeletal: Anterolisthesis of L5 on S1 is likely facet mediated. Degenerative disc disease at L3-L4 and L5-S1. IMPRESSION: 1. Hepatic cirrhosis with moderate ascites. 2. Colonic wall thickening of the ascending colon. Air in stool throughout the colon, some of this air appears circumferential. While this may represent air mixed with stool, in the setting of sepsis and lactic acidosis, colonic pneumatosis is considered and is suspicious for ischemia. Bowel evaluation is technically limited given patient motion, lack of IV contrast, abdominal ascites and paucity of intra-abdominal fat. No evidence of free air. 3. Pelvic floor descent with rectocele. 4. Advanced aorta bi-iliac atherosclerosis. Aortic Atherosclerosis (ICD10-I70.0). 5. Additional chronic findings as described. Critical Value/emergent results were called by telephone at the time of interpretation on 02/25/2019 at 12:01 am to NP [facility] , who verbally acknowledged these results. Electronically Signed   By: Keith Rake M.D.   On: 02/25/2019 00:02   Dg Chest 2 View  Result Date: 03/05/2019 CLINICAL DATA:  Altered mental status EXAM: CHEST - 2 VIEW COMPARISON:  06/21/2018 FINDINGS: Decreased lung volume with mild bibasilar atelectasis. Negative  for heart failure or effusion. Negative for pneumonia. IMPRESSION: Hypoventilation with mild bibasilar atelectasis. Electronically Signed   By: Franchot Gallo M.D.   On: 03/23/2019 17:03   Dg Pelvis 1-2 Views  Result Date: 03/11/2019 CLINICAL DATA:  Pain EXAM: PELVIS - 1-2 VIEW COMPARISON:  CT abdomen pelvis 01/22/2018 FINDINGS: Both hips are normal. No pelvic fracture or mass. Moderate to advanced degenerative change in the SI joints bilaterally. Lumbar scoliosis and degenerative change. IMPRESSION: Negative for fracture. Electronically Signed   By: Franchot Gallo M.D.   On: 03/24/2019 17:06   Dg Abd 1 View  Result Date: 02/25/2019 CLINICAL DATA:  Gastric tube placement EXAM: ABDOMEN - 1 VIEW COMPARISON:  02/25/2019 at 0157 hours FINDINGS: The tip and side port of a gastric tube are noted in the left upper quadrant in the expected location of the stomach. Nonspecific bowel gas pattern is noted. Degenerative disc space narrowing at L4-5 and L5-S1 with facet arthropathy is again noted resulting in gentle levoconvex curvature of the lumbar spine. Aortoiliac atherosclerosis is identified. Sclerosis of the SI joints bilaterally in keeping with osteoarthritis or sacroiliitis is noted. IMPRESSION: 1. Gastric tube in the expected location of the stomach as above. 2. Levoscoliosis of the lumbar spine with lower lumbar degenerative disc and facet arthropathy. 3. Sclerosis of the SI joints bilaterally likely to represent osteoarthritic change. Electronically Signed   By: Ashley Royalty M.D.   On: 02/25/2019 22:09   Dg Abd 1 View  Result Date: 02/25/2019 CLINICAL DATA:  NG tube placement. EXAM: ABDOMEN - 1 VIEW COMPARISON:  CT earlier this day. FINDINGS: Tip and side port of the enteric tube below the diaphragm in the stomach. Air in stool throughout the colon. Question pneumatosis on CT not confirmed on radiograph. Right abdomen not entirely included in the  field of view. No evidence of free air. IMPRESSION: Tip and  side port of the enteric tube below the diaphragm in the stomach. Electronically Signed   By: Keith Rake M.D.   On: 02/25/2019 02:20   Ct Head Wo Contrast  Result Date: 03/02/2019 CLINICAL DATA:  Altered mental status EXAM: CT HEAD WITHOUT CONTRAST TECHNIQUE: Contiguous axial images were obtained from the base of the skull through the vertex without intravenous contrast. COMPARISON:  Head CT 03/13/2019 FINDINGS: Brain: Streak artifact from a left cochlear implant again partially obscures the brain. No hemorrhage or other extra-axial collection is visible. Normal appearance of brain parenchyma. Vascular: No abnormal hyperdensity of the major intracranial arteries or dural venous sinuses. No intracranial atherosclerosis. Skull: The visualized skull base, calvarium are normal. The right parotid gland is enlarged. Sinuses/Orbits: No fluid levels or advanced mucosal thickening of the visualized paranasal sinuses. Postsurgical changes of the left mastoid. The orbits are normal. IMPRESSION: 1. Streak artifact from left cochlear implant partially obscures the brain, the within that limitation there is no acute intracranial abnormality. 2. Enlarged right parotid gland, incompletely visualized. Electronically Signed   By: Ulyses Jarred M.D.   On: 03/02/2019 15:37   Ct Head Wo Contrast  Result Date: 03/12/2019 CLINICAL DATA:  AMS, no trauma EXAM: CT HEAD WITHOUT CONTRAST TECHNIQUE: Contiguous axial images were obtained from the base of the skull through the vertex without intravenous contrast. COMPARISON:  None. FINDINGS: Brain: Left-sided cochlear implant and related dense metallic streak artifact somewhat limits evaluation of the left hemispheric brain parenchyma. No evidence of acute infarction, hemorrhage, hydrocephalus, extra-axial collection or mass lesion/mass effect. Vascular: No hyperdense vessel or unexpected calcification. Skull: Normal. Negative for fracture or focal lesion. Sinuses/Orbits: No acute  finding. Other: None. IMPRESSION: Left-sided cochlear implant and related dense metallic streak artifact somewhat limits evaluation of the left hemispheric brain parenchyma. Within this limitation, no acute intracranial pathology. Electronically Signed   By: Eddie Candle M.D.   On: 03/04/2019 15:54   Ct Femur Right Wo Contrast  Result Date: 03/06/2019 CLINICAL DATA:  Sepsis of unknown origin EXAM: CT OF THE LOWER RIGHT EXTREMITY WITHOUT CONTRAST TECHNIQUE: Multidetector CT imaging of the right lower extremity was performed according to the standard protocol. COMPARISON:  Radiograph 03/08/2019 FINDINGS: Bones/Joint/Cartilage Nondiagnostic images of the distal femur at the level of the knee secondary to patient motion. Imaged portions of the proximal mid and distal femur show no fracture or periostitis. No bony destruction is evident. No significant hip effusion. Ligaments Suboptimally assessed by CT. Muscles and Tendons Mild atrophy of the hip and thigh muscles. No gross intramuscular fluid collections. Soft tissues Diffuse subcutaneous edema. No focal fluid collections. Ascites within the abdomen and pelvis. IMPRESSION: Motion degraded study despite several imaging attempts. Nondiagnostic evaluation of the distal femur at the level of the prosthesis. Imaged portions of the proximal mid and distal right femur show no acute osseous abnormality. Non-specific edema within the subcutaneous soft tissues without gross focal fluid collection. Electronically Signed   By: Donavan Foil M.D.   On: 03/21/2019 23:50   Dg Chest Port 1 View  Result Date: 03/04/2019 CLINICAL DATA:  Hypoxia EXAM: PORTABLE CHEST 1 VIEW COMPARISON:  March 03, 2019 FINDINGS: Endotracheal tube tip is 2.9 cm above the carina. Central catheter tip is in the right atrium. Nasogastric tube tip and side port are below the diaphragm. No pneumothorax. There is patchy airspace consolidation in the right lower lobe region, stable. There is stable  bibasilar atelectasis.  Heart size and pulmonary vascularity are normal. No adenopathy. There is aortic atherosclerosis. No bone lesions. IMPRESSION: Tube and catheter positions as described without pneumothorax. Persistent airspace opacity felt to represent pneumonia right lower lobe. Bibasilar atelectasis. Stable cardiac silhouette. Aortic Atherosclerosis (ICD10-I70.0). Electronically Signed   By: Lowella Grip III M.D.   On: 03/04/2019 07:43   Dg Chest Port 1 View  Result Date: 03/03/2019 CLINICAL DATA:  Intubation EXAM: PORTABLE CHEST 1 VIEW COMPARISON:  03/02/2019, 02/28/2019, 02/26/2019 FINDINGS: Endotracheal tube tip is just above the carina. Esophageal tube tip below the diaphragm but non included. Left IJ central venous catheter tip over the mid right atrium. Worsening airspace disease at the right infrahilar lung. Persistent airspace disease at the left base. Probable small pleural effusions. Stable cardiomediastinal silhouette with aortic atherosclerosis. Linear scarring or atelectasis in the right upper lobe. IMPRESSION: 1. Endotracheal tube tip just above the carina. 2. Left IJ central venous catheter tip projects over the mid right atrium 3. Worsening airspace disease in the right infrahilar lung and lung base. Suspected trace pleural effusions and left basilar airspace disease are otherwise unchanged Electronically Signed   By: Donavan Foil M.D.   On: 03/03/2019 01:20   Dg Chest Port 1 View  Result Date: 03/02/2019 CLINICAL DATA:  Life support line placement. EXAM: PORTABLE CHEST 1 VIEW COMPARISON:  Chest radiograph February 28, 2019 FINDINGS: Endotracheal tube tip projects 11 mm above the carina. LEFT internal jugular central venous catheter distal tip projects in distal superior vena cava. Nasogastric tube past mid stomach, distal tip out of field of view. Cardiac silhouette is upper limits of normal in size. Calcified aortic arch. Bibasilar strandy densities. No pleural effusion focal  consolidation. No pneumothorax. Soft tissue planes and included osseous structures are non suspicious. IMPRESSION: 1. Endotracheal tube tip projects 11 mm above the carina, recommend 1-2 cm retraction. LEFT internal jugular central venous catheter distal tip projects in distal superior vena cava. Nasogastric tube past mid stomach. 2. Bibasilar atelectasis/scarring. 3.  Aortic Atherosclerosis (ICD10-I70.0). Electronically Signed   By: Elon Alas M.D.   On: 03/02/2019 04:29   Dg Chest Port 1 View  Result Date: 02/28/2019 CLINICAL DATA:  Aspiration into airway. EXAM: PORTABLE CHEST 1 VIEW COMPARISON:  02/26/2019 and 03/14/2019 FINDINGS: Nasogastric tube extends into the abdomen but the tip is beyond the image. Again noted are low lung volumes with hazy densities at the left lung base. Few linear densities in the right upper lung could represent scarring or atelectasis. Heart size is stable. Atherosclerotic calcifications at the aortic arch. Negative for a pneumothorax. Gas-filled loops of bowel in the upper abdomen. IMPRESSION: Persistent bibasilar chest densities, left side greater than right. Findings are suggestive for atelectasis but could represent aspiration based on history. Minimal change from the most recent examination. Electronically Signed   By: Markus Daft M.D.   On: 02/28/2019 12:03   Dg Chest Port 1 View  Result Date: 02/26/2019 CLINICAL DATA:  Respiratory failure EXAM: PORTABLE CHEST 1 VIEW COMPARISON:  03/21/2019 FINDINGS: Cardiac shadows within normal limits. Nasogastric catheter extends into the stomach. Bibasilar atelectasis is noted increased from the prior exam. No focal confluent infiltrate or sizable effusion is seen. IMPRESSION: Increasing bibasilar atelectasis. Electronically Signed   By: Inez Catalina M.D.   On: 02/26/2019 08:07   Dg Femur Min 2 Views Right  Result Date: 03/10/2019 CLINICAL DATA:  Pain EXAM: RIGHT FEMUR 2 VIEWS COMPARISON:  None. FINDINGS: Normal right hip  joint. No femur fracture. Right  knee replacement satisfactory position alignment without loosening. IMPRESSION: No acute abnormality.  Right knee replacement. Electronically Signed   By: Franchot Gallo M.D.   On: 03/06/2019 17:05   Ct Maxillofacial Wo Contrast  Result Date: 02/23/2019 CLINICAL DATA:  Right facial swelling. EXAM: CT MAXILLOFACIAL WITHOUT CONTRAST TECHNIQUE: Multidetector CT imaging of the maxillofacial structures was performed. Multiplanar CT image reconstructions were also generated. COMPARISON:  Head CT obtained earlier today. FINDINGS: Osseous: Previously noted left cochlear implant. Reversal of the normal cervical lordosis and multilevel degenerative changes. No acute maxillofacial bony abnormality. The patient is edentulous. Orbits: Unremarkable. Sinuses: Normally aerated. Soft tissues: The right parotid gland is significantly larger than the left parotid gland. On the right, the parotid gland measures 4.2 x 3.1 cm and on the left the parotid gland measures 2.5 x 2.4 cm on image number 49 series 8. There is also adjacent right lateral subcutaneous edema at the level of the parotid gland on the right, extending anteriorly. No parotid stone is visualized. Limited intracranial: Unremarkable. IMPRESSION: 1. Changes of parotitis on the right with no abscess visible, limited by the lack of intravenous contrast. 2. Cervical spine degenerative changes and reversal of the normal cervical lordosis. Electronically Signed   By: Claudie Revering M.D.   On: 03/10/2019 17:44   US Abdomen Limited Ruq  Result Date: 03/02/2019 CLINICAL DATA:  Right upper quadrant pain x1 day EXAM: ULTRASOUND ABDOMEN LIMITED RIGHT UPPER QUADRANT COMPARISON:  01/22/2018 CT FINDINGS: Limited study as the patient deferred repositioning for better images. Gallbladder: Distended gallbladder without mural thickening however there is a significant amount of biliary sludge and admixed punctate gallstones noted. No secondary signs of  acute cholecystitis. Common bile duct: Not visualized. Liver: Coarsened echotexture with morphologic changes of cirrhosis. No discrete mass. Small to moderate volume of ascites is identified outlining the liver. Portal vein is patent on color Doppler imaging with normal direction of blood flow towards the liver. IMPRESSION: 1. Morphologic changes of cirrhosis with ascites. 2. Biliary sludge and tiny calculi are noted within the distended gallbladder without secondary signs of acute cholecystitis. Electronically Signed   By: Ashley Royalty M.D.   On: 03/13/2019 18:08    Microbiology No results found for this or any previous visit (from the past 240 hour(s)).  Lab Basic Metabolic Panel: No results for input(s): NA, K, CL, CO2, GLUCOSE, BUN, CREATININE, CALCIUM, MG, PHOS in the last 168 hours. Liver Function Tests: No results for input(s): AST, ALT, ALKPHOS, BILITOT, PROT, ALBUMIN in the last 168 hours. No results for input(s): LIPASE, AMYLASE in the last 168 hours. No results for input(s): AMMONIA in the last 168 hours. CBC: No results for input(s): WBC, NEUTROABS, HGB, HCT, MCV, PLT in the last 168 hours. Cardiac Enzymes: No results for input(s): CKTOTAL, CKMB, CKMBINDEX, TROPONINI in the last 168 hours. Sepsis Labs: No results for input(s): PROCALCITON, WBC, LATICACIDVEN in the last 168 hours.  Procedures/Operations     Elester Apodaca 03/18/2019, 5:14 PM

## 2019-03-25 NOTE — Progress Notes (Signed)
Pt's VS began deteriorating at 0345. Family called and made aware. Pt passed at 0432 with RN at bedside. Ellard Artis, RN and Katelind, RN listened to confirm death. Family arrived shortly after.

## 2019-03-25 NOTE — Progress Notes (Signed)
   03/11/19 0432  Attending [place]  Attending Physician Notified Y  Attending Physician (First and Last Name) Elsie Lincoln  Will the above attending physician sign death certificate? No  Physician (First and Last Name) Who Will Sign Death Certificate Jennet Maduro  Post Mortem Checklist  Date of Death 2019/03/11  Time of Death 0432  Pronounced By Ellard Artis, RN and Katelind, RN  Next of kin notified Yes  Name of next of kin notified of death [place] Person's Relationship to Patient Daughter  Contact Person's Phone Number [phone]  Contact Person's address [phone], [place], [place]  Was the patient a No Code Blue or a Limited Code Blue? Yes  Did the patient die unattended? No  Patient restrained? Not applicable  Height $Remov'5\' 1"'tMGlbM$  (1.549 m)  [place] [facility]  Notification Date 03/11/2019  Notification Time [place] [facility] Number [phone]  Is patient a potential donor? N  Autopsy  Autopsy requested by N/A  Patient Belongings/Medications Returned  Patient belongings from bedside/safe/pharmacy returned  Yes  Valuables returned to? Donna Bernard  Dead on Arrival (Emergency Department)  Patient dead on arrival? No  Medical Examiner  Is this a medical examiner's case? [facility] home name/address/phone # [place] [phone]  Planned location of pickup [place]

## 2019-03-25 NOTE — Progress Notes (Signed)
eLink Physician-Brief Progress Note Patient Name: [name] DOB: 07-20-1946 MRN: [id number]   Date of Service  03/12/19  HPI/Events of Note  Informed by bedside ICU of patient's passing at 04:32 on 03/12/2019.  eICU Interventions  none     Intervention Category Minor Interventions: Other:  Elsie Lincoln 12-Mar-2019, 5:24 AM

## 2019-03-25 DEATH — deceased

## 2019-04-11 IMAGING — CR DG CHEST 2V
2 series · 2 of 2 positions shown · non-contrast
Comparison: 06/21/2018

CLINICAL DATA: Altered mental status

EXAM:
CHEST - 2 VIEW

[chest lat]
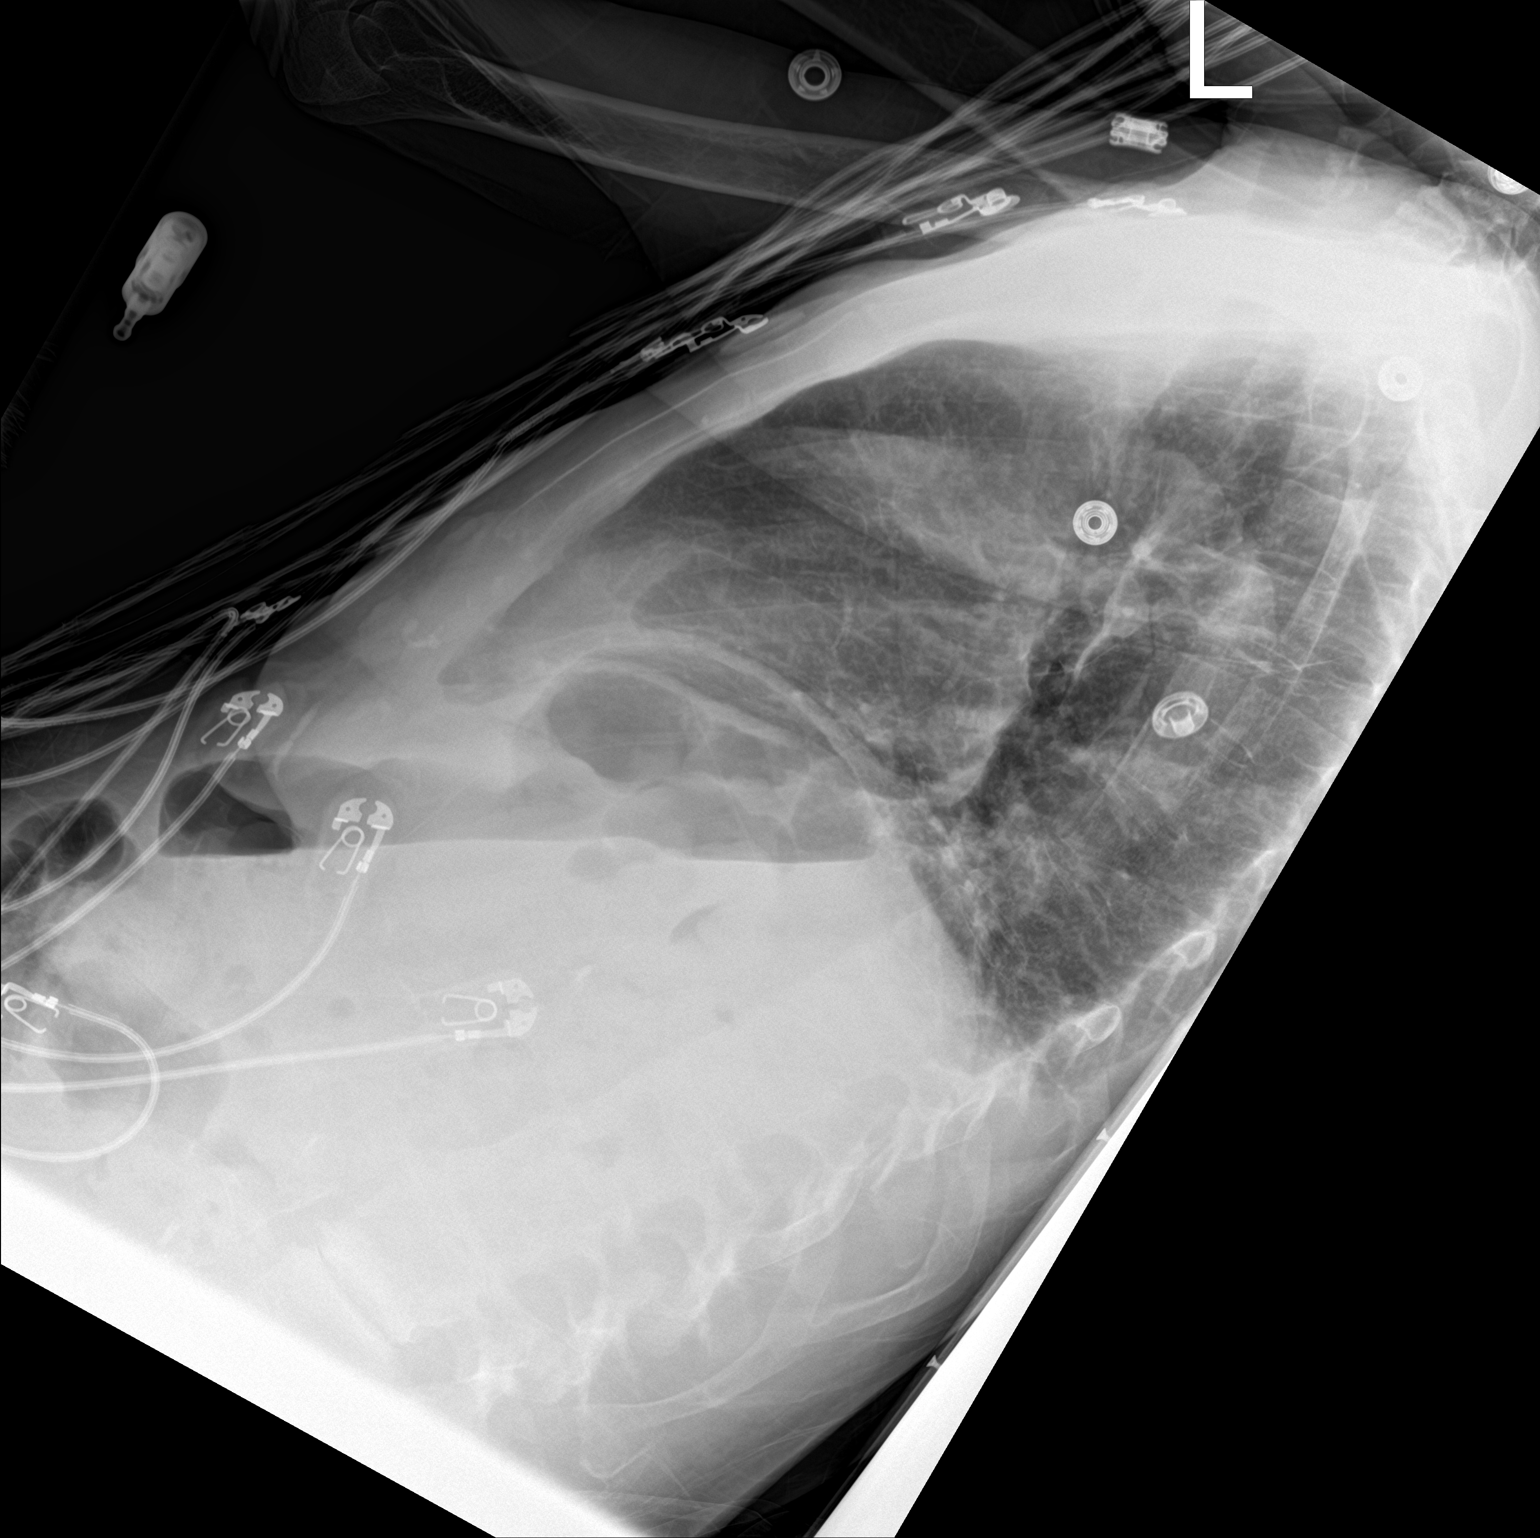

[chest ap]
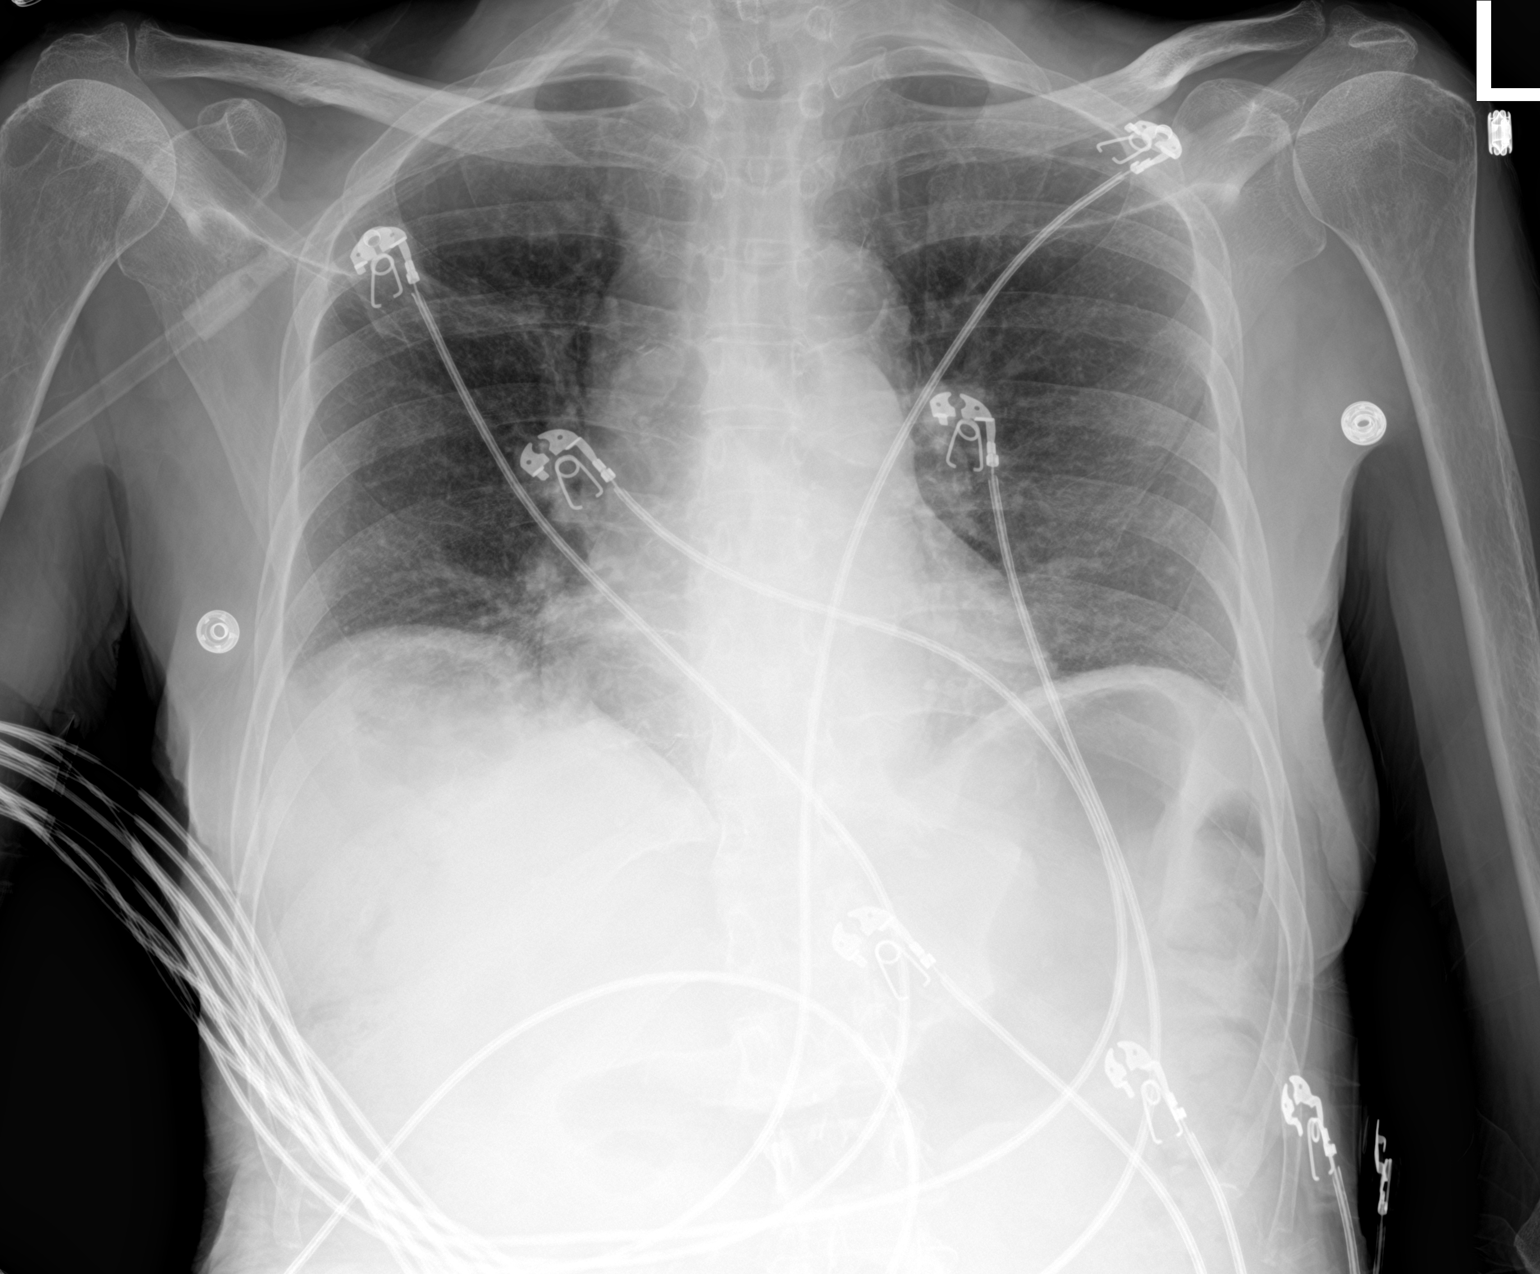

[2 of 2 positions shown; findings below may reference images not displayed]

FINDINGS: Decreased lung volume with mild bibasilar atelectasis. Negative for
heart failure or effusion. Negative for pneumonia.
IMPRESSION: Hypoventilation with mild bibasilar atelectasis.

## 2019-04-12 IMAGING — DX DG ABDOMEN 1V
1 series · 1 of 1 positions shown · non-contrast
Comparison: 02/25/2019 at 3781 hours

CLINICAL DATA: Gastric tube placement

EXAM:
ABDOMEN - 1 VIEW

[abdomen kub]
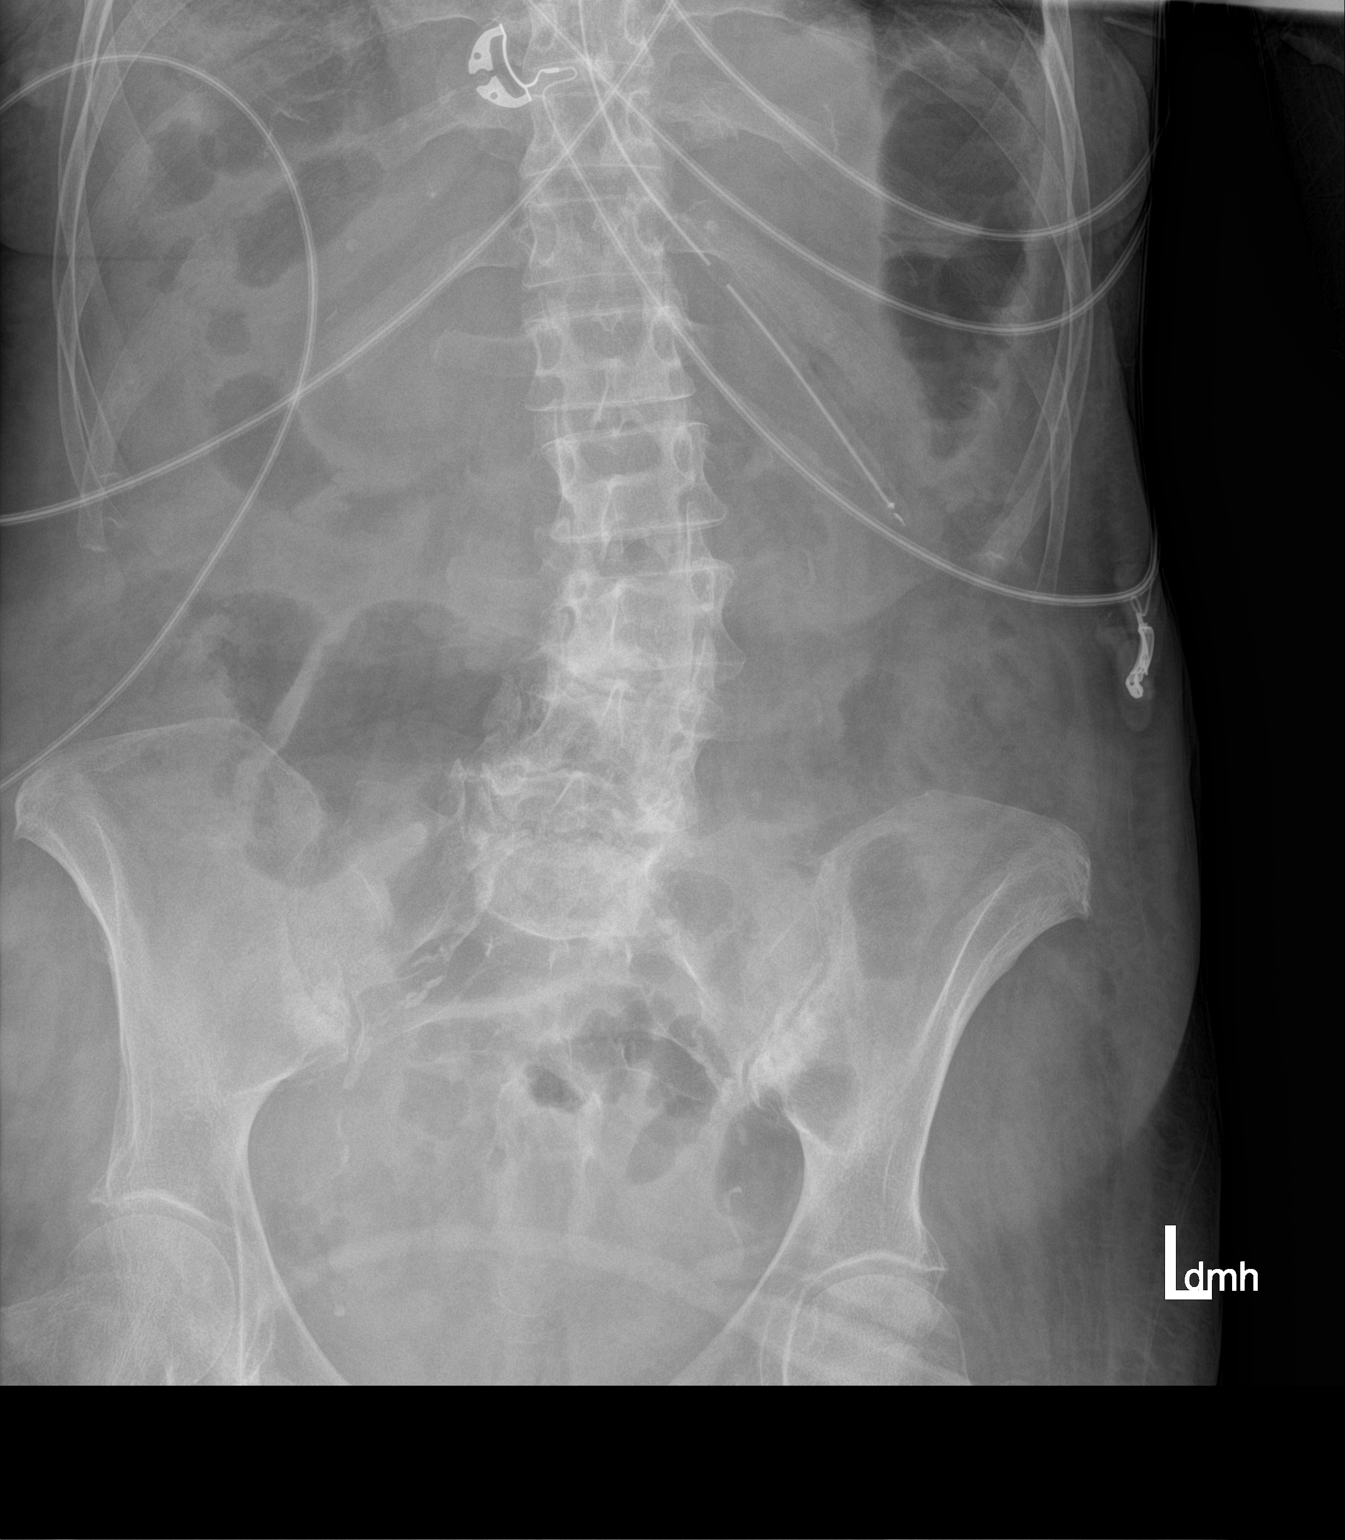

[1 of 1 positions shown; findings below may reference images not displayed]

FINDINGS: The tip and side port of a gastric tube are noted in the left upper
quadrant in the expected location of the stomach. Nonspecific bowel
gas pattern is noted. Degenerative disc space narrowing at L4-5 and
L5-S1 with facet arthropathy is again noted resulting in gentle
levoconvex curvature of the lumbar spine. Aortoiliac atherosclerosis
is identified. Sclerosis of the SI joints bilaterally in keeping
with osteoarthritis or sacroiliitis is noted.
IMPRESSION: 1. Gastric tube in the expected location of the stomach as above.
2. Levoscoliosis of the lumbar spine with lower lumbar degenerative
disc and facet arthropathy.
3. Sclerosis of the SI joints bilaterally likely to represent
osteoarthritic change.

## 2019-04-13 IMAGING — DX DG CHEST 1V PORT
1 series · 1 of 1 positions shown · non-contrast
Comparison: 02/24/2019

CLINICAL DATA: Respiratory failure

EXAM:
PORTABLE CHEST 1 VIEW

[chest]
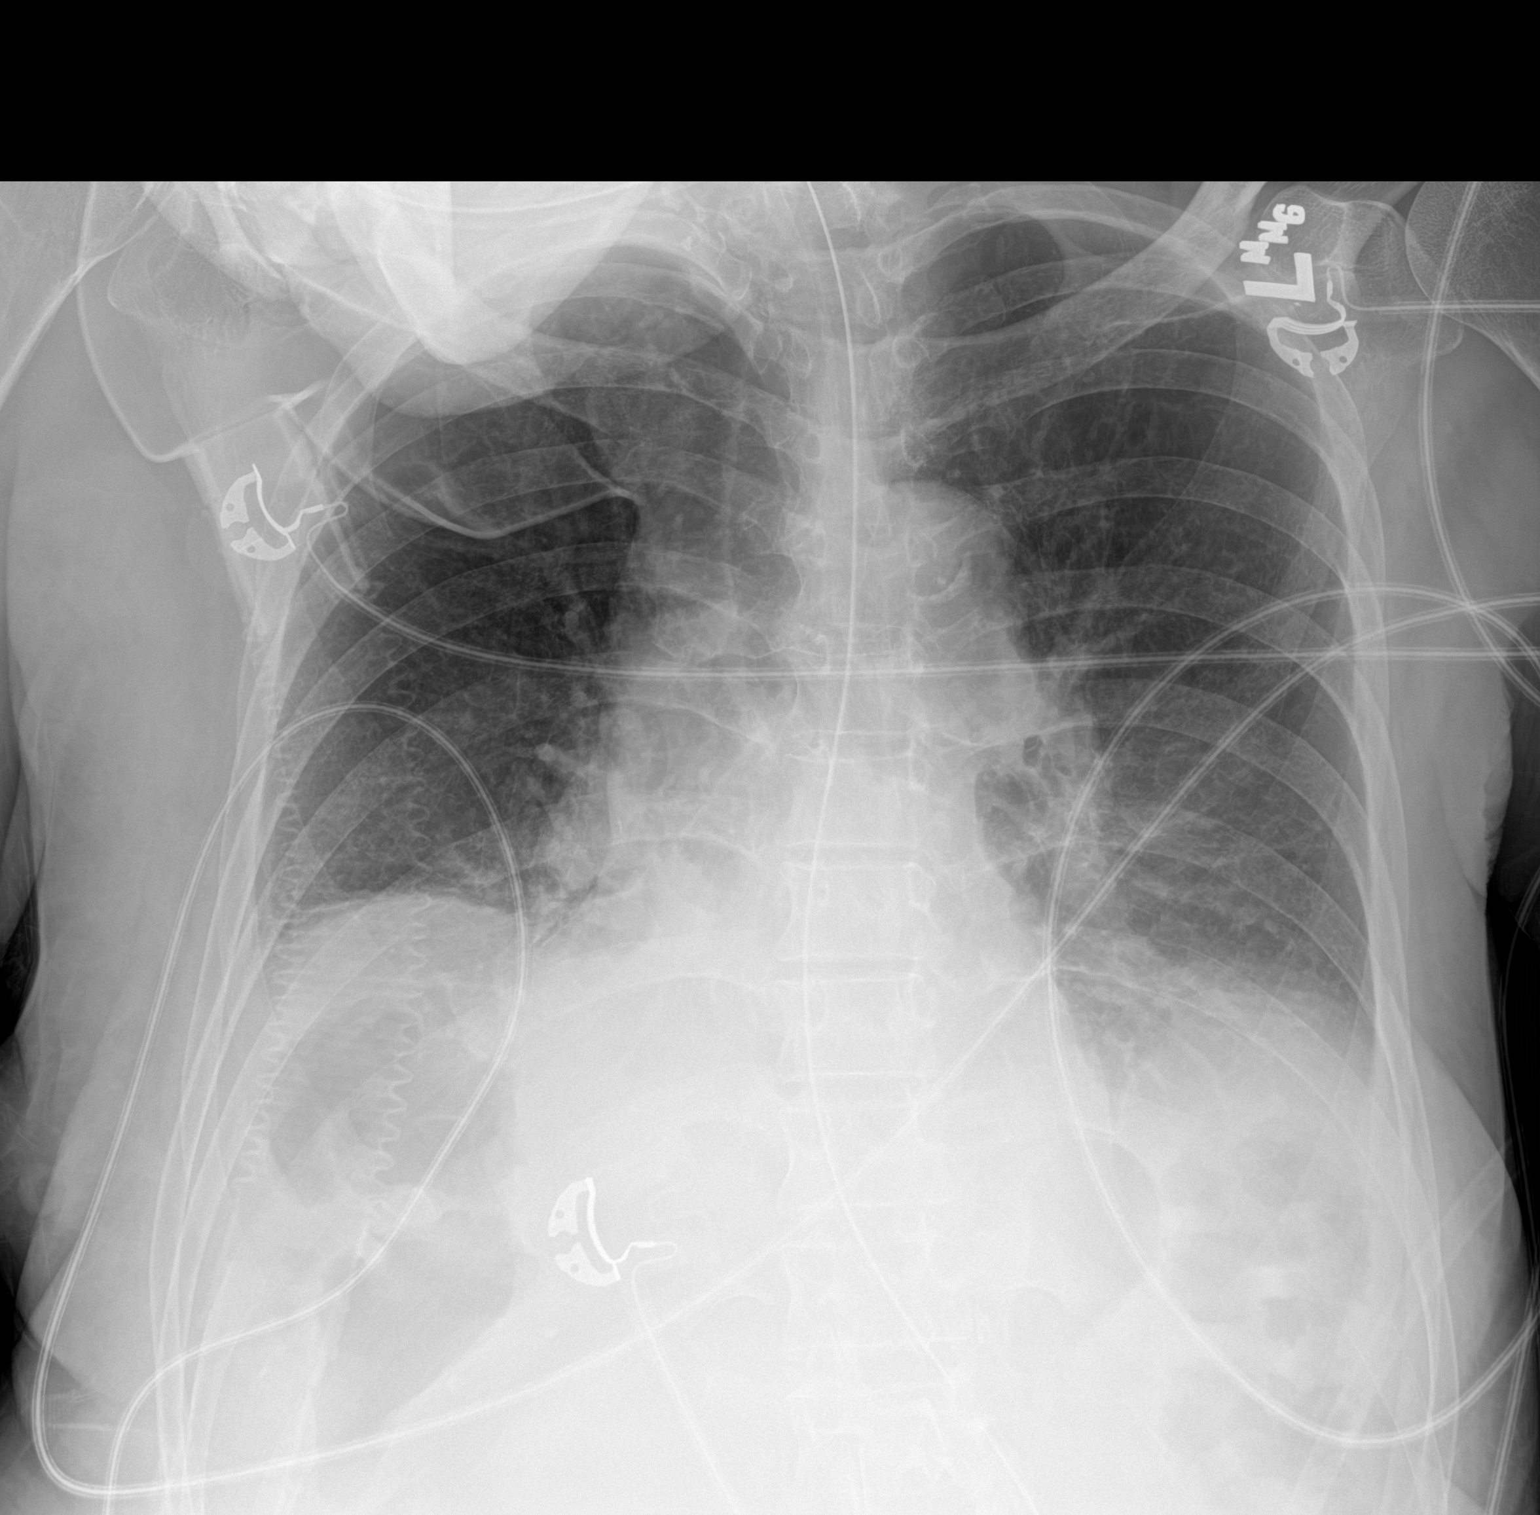

[1 of 1 positions shown; findings below may reference images not displayed]

FINDINGS: Cardiac shadows within normal limits. Nasogastric catheter extends
into the stomach. Bibasilar atelectasis is noted increased from the
prior exam. No focal confluent infiltrate or sizable effusion is
seen.
IMPRESSION: Increasing bibasilar atelectasis.

## 2020-08-23 IMAGING — US US ABDOMEN LIMITED
1 series · 14 of 25 positions shown · non-contrast
Comparison: 01/22/2018 CT

CLINICAL DATA: Right upper quadrant pain x1 day

EXAM:
ULTRASOUND ABDOMEN LIMITED RIGHT UPPER QUADRANT

[Series 1: us abdomen limited · 0.22mm/px · 14 of 27 slices shown]
[im 1/27]
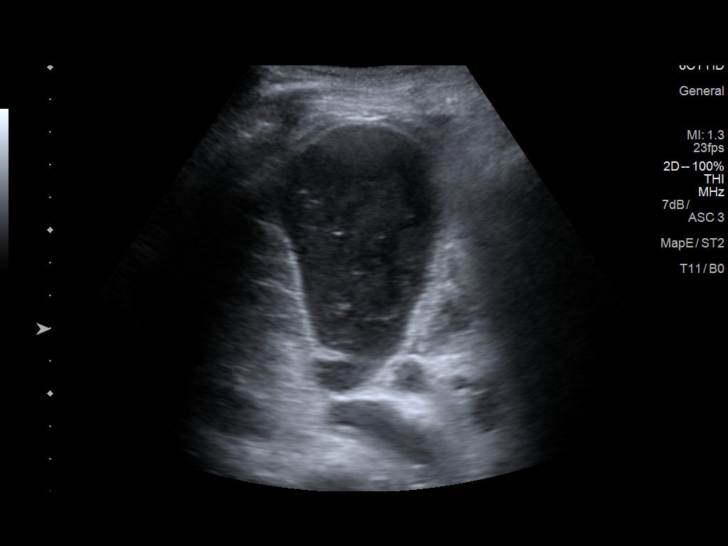
[im 3/27]
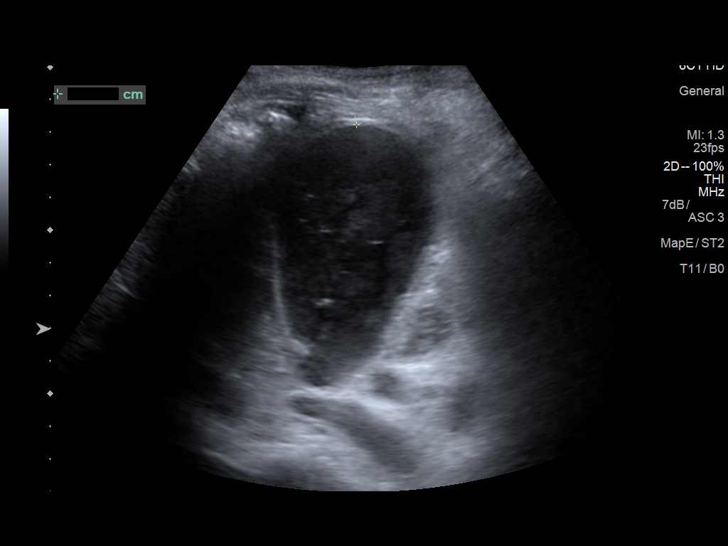
[im 5/27]
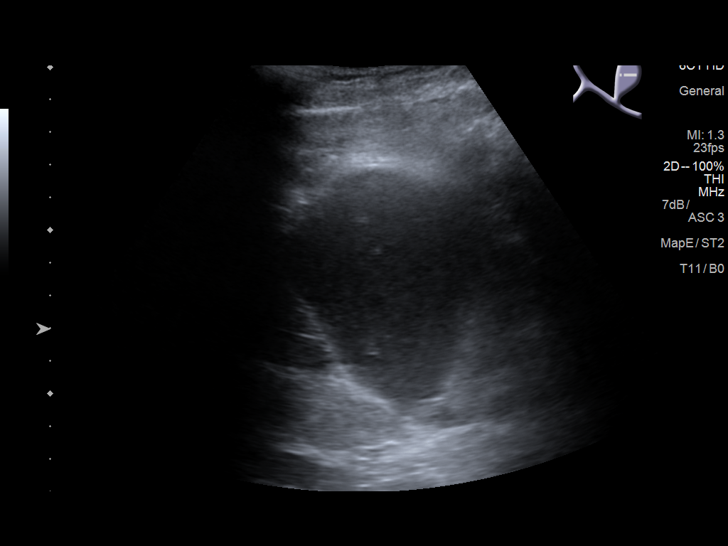
[im 7/27]
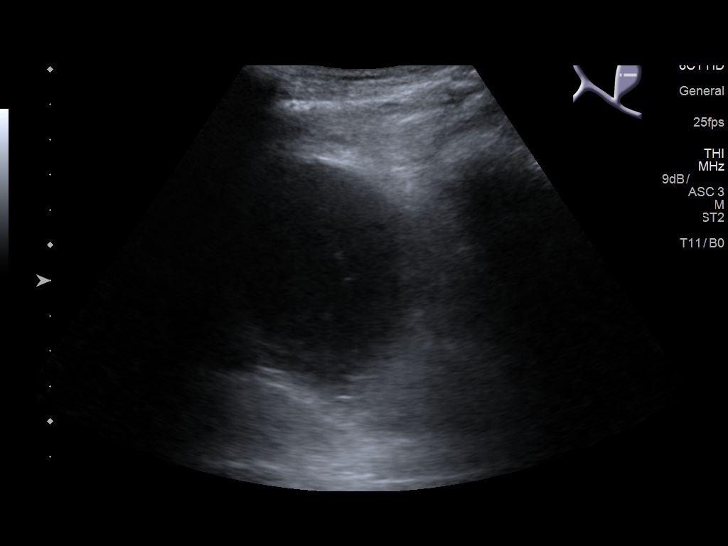
[im 9/27]
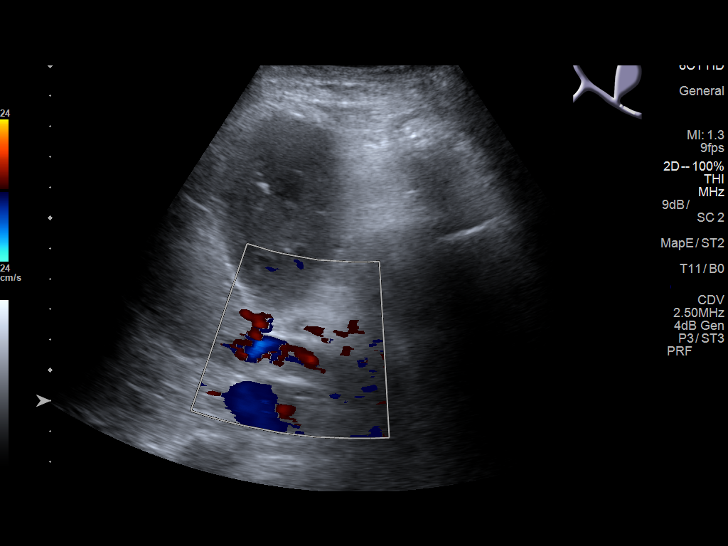
[im 10/27]
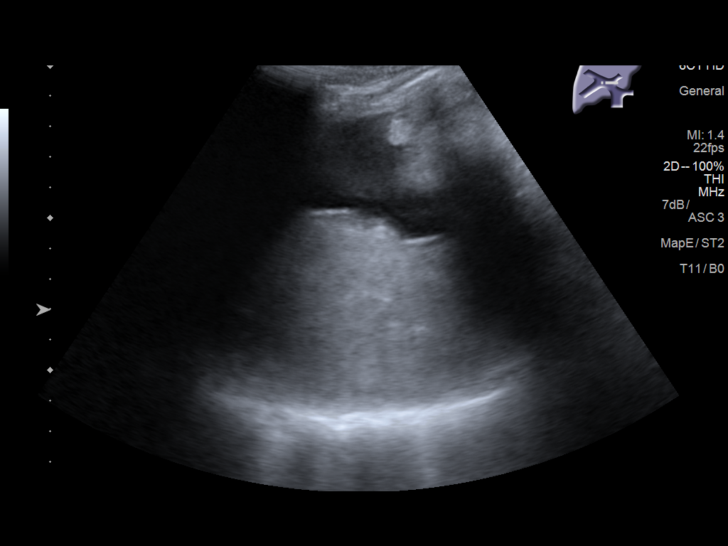
[im 12/27]
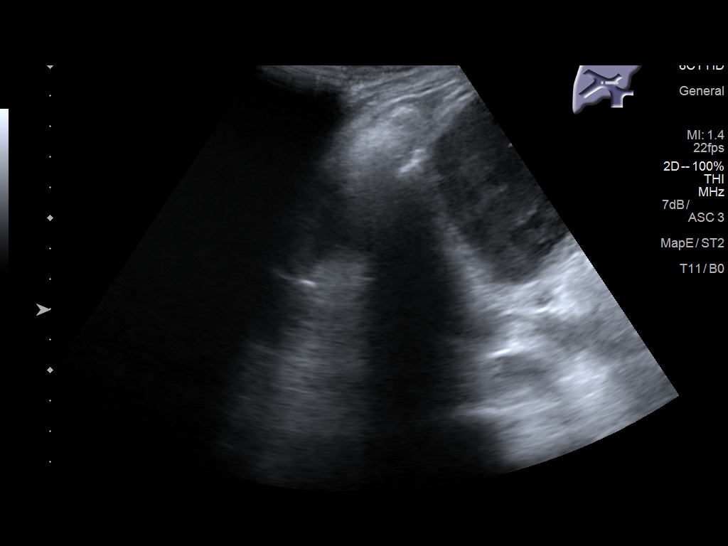
[im 15/27]
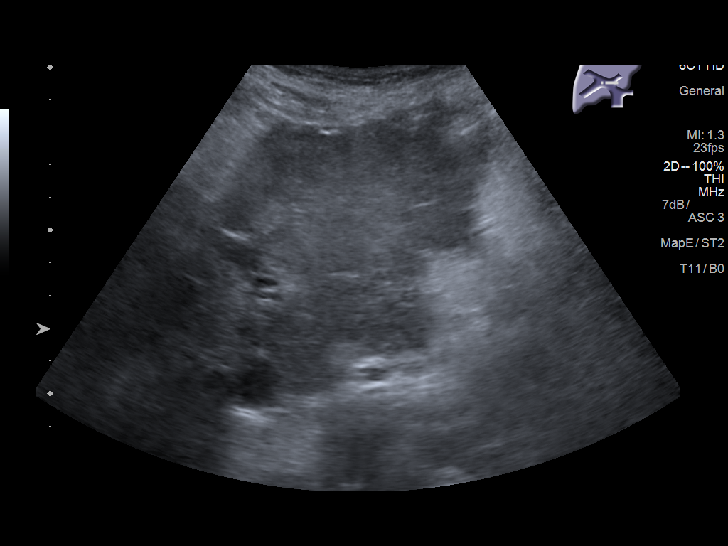
[im 17/27]
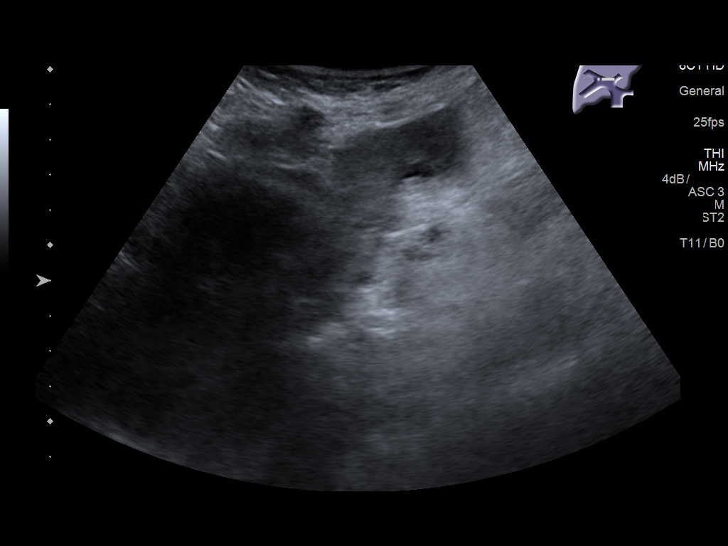
[im 18/27]
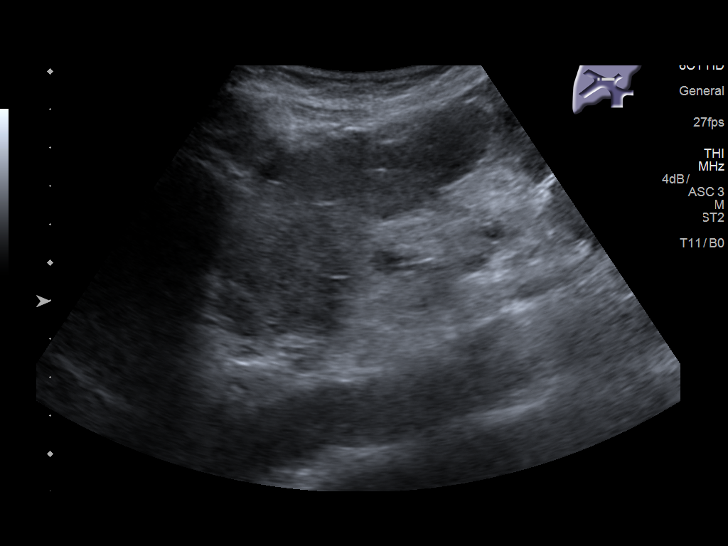
[im 20/27]
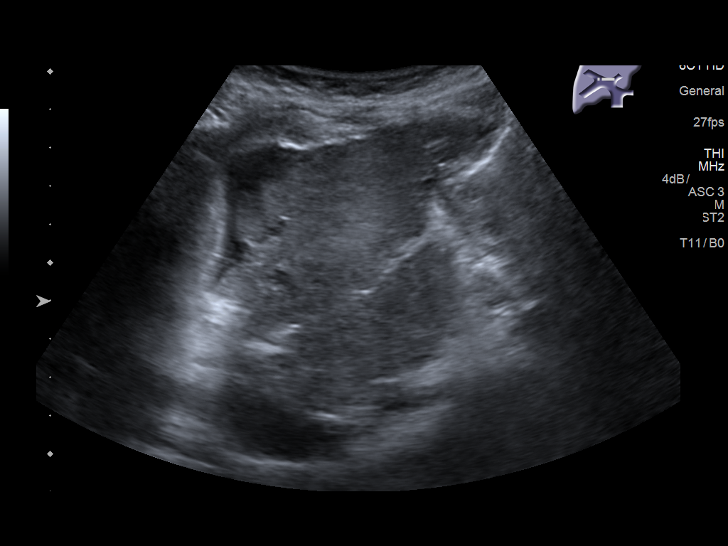
[im 22/27]
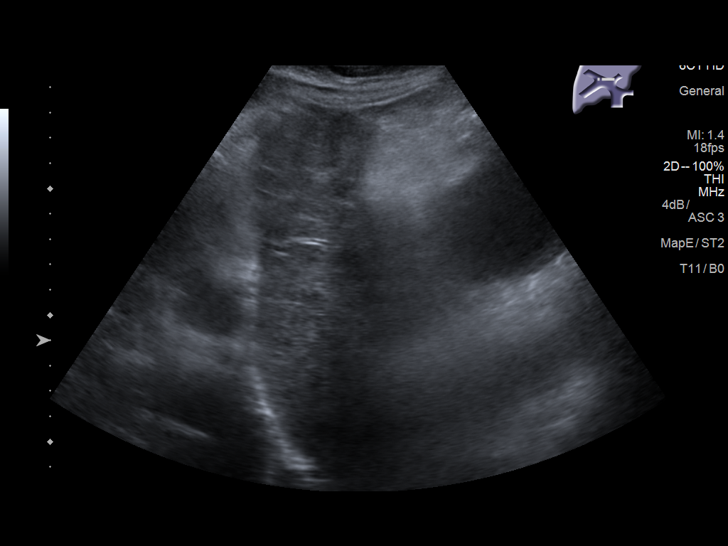
[im 24/27]
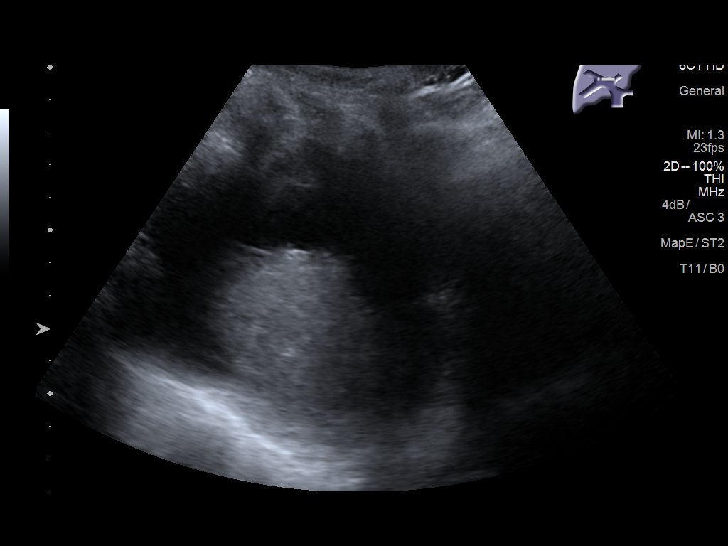
[im 27/27]
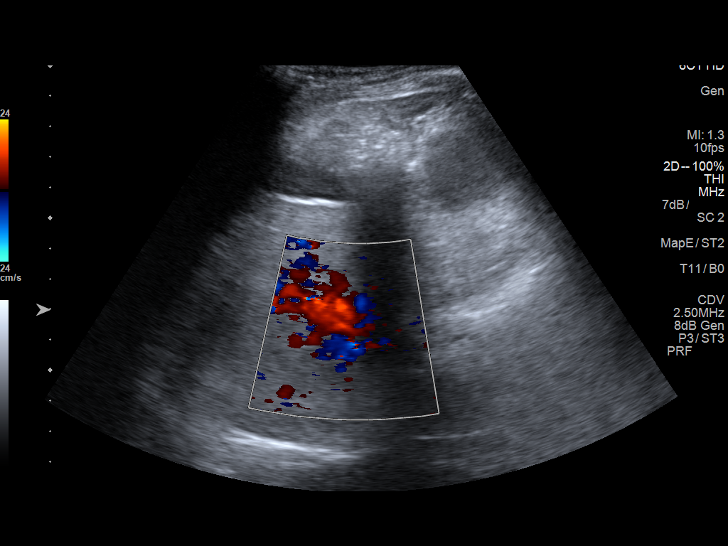

[14 of 25 positions shown; findings below may reference images not displayed]

FINDINGS: Limited study as the patient deferred repositioning for better
images.

Gallbladder:

Distended gallbladder without mural thickening however there is a
significant amount of biliary sludge and admixed punctate gallstones
noted. No secondary signs of acute cholecystitis.

Common bile duct: Not visualized.

Liver:

Coarsened echotexture with morphologic changes of cirrhosis. No
discrete mass. Small to moderate volume of ascites is identified
outlining the liver. Portal vein is patent on color Doppler imaging
with normal direction of blood flow towards the liver.
IMPRESSION: 1. Morphologic changes of cirrhosis with ascites.
2. Biliary sludge and tiny calculi are noted within the distended
gallbladder without secondary signs of acute cholecystitis.
